# Patient Record
Sex: Male | Born: 1955 | Race: White | Hispanic: No | Marital: Married | Smoking: Former smoker
Health system: Southern US, Community
[De-identification: ages and names within clinical notes are randomized; demographics above are authoritative.]

## PROBLEM LIST (undated history)

## (undated) DIAGNOSIS — I4729 Other ventricular tachycardia: Secondary | ICD-10-CM

## (undated) DIAGNOSIS — F419 Anxiety disorder, unspecified: Secondary | ICD-10-CM

## (undated) DIAGNOSIS — I447 Left bundle-branch block, unspecified: Secondary | ICD-10-CM

## (undated) DIAGNOSIS — K219 Gastro-esophageal reflux disease without esophagitis: Secondary | ICD-10-CM

## (undated) DIAGNOSIS — Z8546 Personal history of malignant neoplasm of prostate: Secondary | ICD-10-CM

## (undated) DIAGNOSIS — Z9289 Personal history of other medical treatment: Secondary | ICD-10-CM

## (undated) DIAGNOSIS — R002 Palpitations: Secondary | ICD-10-CM

## (undated) DIAGNOSIS — I4891 Unspecified atrial fibrillation: Secondary | ICD-10-CM

## (undated) DIAGNOSIS — I10 Essential (primary) hypertension: Secondary | ICD-10-CM

## (undated) DIAGNOSIS — I420 Dilated cardiomyopathy: Secondary | ICD-10-CM

## (undated) DIAGNOSIS — E785 Hyperlipidemia, unspecified: Secondary | ICD-10-CM

## (undated) DIAGNOSIS — Z923 Personal history of irradiation: Secondary | ICD-10-CM

## (undated) DIAGNOSIS — I8290 Acute embolism and thrombosis of unspecified vein: Secondary | ICD-10-CM

## (undated) DIAGNOSIS — I472 Ventricular tachycardia: Secondary | ICD-10-CM

## (undated) DIAGNOSIS — I119 Hypertensive heart disease without heart failure: Secondary | ICD-10-CM

## (undated) DIAGNOSIS — T148XXA Other injury of unspecified body region, initial encounter: Secondary | ICD-10-CM

## (undated) DIAGNOSIS — C61 Malignant neoplasm of prostate: Secondary | ICD-10-CM

## (undated) DIAGNOSIS — I43 Cardiomyopathy in diseases classified elsewhere: Secondary | ICD-10-CM

## (undated) HISTORY — DX: Personal history of malignant neoplasm of prostate: Z85.46

## (undated) HISTORY — DX: Anxiety disorder, unspecified: F41.9

## (undated) HISTORY — DX: Other ventricular tachycardia: I47.29

## (undated) HISTORY — DX: Acute embolism and thrombosis of unspecified vein: I82.90

## (undated) HISTORY — DX: Hyperlipidemia, unspecified: E78.5

## (undated) HISTORY — DX: Left bundle-branch block, unspecified: I44.7

## (undated) HISTORY — DX: Cardiomyopathy in diseases classified elsewhere: I43

## (undated) HISTORY — DX: Essential (primary) hypertension: I10

## (undated) HISTORY — DX: Hypertensive heart disease without heart failure: I11.9

## (undated) HISTORY — DX: Unspecified atrial fibrillation: I48.91

## (undated) HISTORY — DX: Ventricular tachycardia: I47.2

## (undated) HISTORY — DX: Palpitations: R00.2

## (undated) HISTORY — DX: Other injury of unspecified body region, initial encounter: T14.8XXA

## (undated) HISTORY — DX: Dilated cardiomyopathy: I42.0

## (undated) HISTORY — DX: Cardiomyopathy in diseases classified elsewhere: I11.9

## (undated) HISTORY — DX: Personal history of other medical treatment: Z92.89

---

## 1988-09-20 HISTORY — PX: CARDIAC CATHETERIZATION: SHX172

## 1991-09-21 HISTORY — PX: BACK SURGERY: SHX140

## 1998-12-30 ENCOUNTER — Ambulatory Visit (HOSPITAL_COMMUNITY): Admission: RE | Admit: 1998-12-30 | Discharge: 1998-12-30 | Payer: Self-pay | Admitting: *Deleted

## 2001-01-17 ENCOUNTER — Encounter: Payer: Self-pay | Admitting: Specialist

## 2001-01-17 ENCOUNTER — Ambulatory Visit (HOSPITAL_COMMUNITY): Admission: RE | Admit: 2001-01-17 | Discharge: 2001-01-17 | Payer: Self-pay | Admitting: *Deleted

## 2002-04-02 ENCOUNTER — Emergency Department (HOSPITAL_COMMUNITY): Admission: EM | Admit: 2002-04-02 | Discharge: 2002-04-03 | Payer: Self-pay | Admitting: Emergency Medicine

## 2006-07-22 ENCOUNTER — Emergency Department (HOSPITAL_COMMUNITY): Admission: EM | Admit: 2006-07-22 | Discharge: 2006-07-22 | Payer: Self-pay | Admitting: Emergency Medicine
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
## 2008-09-20 DIAGNOSIS — Z9289 Personal history of other medical treatment: Secondary | ICD-10-CM

## 2008-09-20 HISTORY — DX: Personal history of other medical treatment: Z92.89

## 2008-10-11 ENCOUNTER — Encounter: Payer: Self-pay | Admitting: Cardiology

## 2012-03-14 ENCOUNTER — Encounter: Payer: Self-pay | Admitting: Cardiology

## 2012-04-05 ENCOUNTER — Encounter: Payer: Self-pay | Admitting: Cardiology

## 2012-04-18 ENCOUNTER — Encounter: Payer: Self-pay | Admitting: Cardiology

## 2012-05-18 ENCOUNTER — Encounter: Payer: Self-pay | Admitting: Cardiology

## 2012-06-20 ENCOUNTER — Encounter: Payer: Self-pay | Admitting: Cardiology

## 2012-08-16 ENCOUNTER — Encounter: Payer: Self-pay | Admitting: Cardiology

## 2013-07-19 ENCOUNTER — Encounter: Payer: Self-pay | Admitting: Cardiology

## 2013-07-19 ENCOUNTER — Ambulatory Visit (INDEPENDENT_AMBULATORY_CARE_PROVIDER_SITE_OTHER): Payer: PRIVATE HEALTH INSURANCE | Admitting: Cardiology

## 2013-07-19 VITALS — BP 169/98 | HR 73 | Ht 69.0 in | Wt 194.0 lb

## 2013-07-19 DIAGNOSIS — R002 Palpitations: Secondary | ICD-10-CM

## 2013-07-19 DIAGNOSIS — I447 Left bundle-branch block, unspecified: Secondary | ICD-10-CM | POA: Insufficient documentation

## 2013-07-19 DIAGNOSIS — I10 Essential (primary) hypertension: Secondary | ICD-10-CM | POA: Insufficient documentation

## 2013-07-19 NOTE — Progress Notes (Signed)
Dennis Lewis Date of Birth: 1956/09/19 Medical Record #[id number]  History of Present Illness: Mr. Sciulli is seen today for evaluation of palpitations. He is a pleasant 57 year old white male referred by Dr. Clelia Lewis. He has a history of chronic left bundle branch block. He has had extensive cardiac evaluation the past. He had a normal cardiac catheterization in 1990. In 2010 he had a normal [name] study. Echocardiogram in 2010 demonstrated mild LVH with some septal dyssynergy and mildly depressed LV function. He reports a history of palpitations. These were much more noticeable a year ago. He stop taking Protonix and states that his palpitations went away. In April he was taking Advil for a tennis elbow when he had recurrence of the palpitations. This improved off the medication. Last night he states he had recurrent palpitations. He had been very stressed and had driven over 200 miles yesterday. He took a Valium at bedtime and this helped. He denies any sustained tachycardia, dizziness, or syncope. He does take Toprol daily for blood pressure. His blood pressure has been consistently high. Dr. Clelia Lewis started him on a new medication recently but he does not know the name of it. He reacted previously to ACE inhibitors with lip swelling and blistering.  Current Outpatient Prescriptions on File Prior to Visit  Medication Sig Dispense Refill  . metoprolol succinate (TOPROL-XL) 50 MG 24 hr tablet Take 50 mg by mouth daily. Take with or immediately following a meal.       No current facility-administered medications on file prior to visit.    Allergies  Allergen Reactions  . Ace Inhibitors Other (See Comments)    Lip swelling/blisters     Past Medical History  Diagnosis Date  . Palpitations   . Hypertension   . Left bundle branch block   . Hyperlipidemia   . Torn tendon     right shoulder  . Anxiety     Past Surgical History  Procedure Laterality Date  . Cardiac catheterization      . Back surgery      History  Smoking status  . Former Smoker  . Quit date: 02/21/1977  Smokeless tobacco  . Not on file    History  Alcohol Use: Not on file    History reviewed. No pertinent family history.  Review of Systems: As noted in history of present illness.  All other systems were reviewed and are negative.  Physical Exam: BP 169/98  Pulse 73  Ht 5\' 9"  (1.753 m)  Wt 194 lb (87.998 kg)  BMI 28.64 kg/m2 He is a pleasant white male in no acute distress. HEENT: Normal Neck: No JVD, adenopathy, thyromegaly, or bruits. Lungs: Clear Cardiovascular: Regular rate and rhythm. Normal S1 and S2. No gallop, murmur, or click. Abdomen: Soft and nontender. No masses or hepatosplenomegaly. Extremities: No cyanosis or edema. Pedal pulses are 2+ and symmetric. Skin: Warm and dry Neuro: Alert oriented x3.  LABORATORY DATA: ECG today demonstrates normal sinus rhythm with a rate of 73 beats per minute. He has a chronic left bundle branch block.  Assessment / Plan: 1. Palpitations. His symptoms are consistent with either PACs or PVCs. He has had extensive cardiac evaluation the past which was unremarkable. I recommended continuing with his beta blocker therapy. I recommend he start taking the blood pressure medication prescribed by Dr. Clelia Lewis. I think that improving his blood pressure control is probably the best way to reduce his palpitations. He already avoids caffeine and chocolates. He does not take  decongestants. I do not feel that further cardiac evaluation is needed at this time and I'll see him back as needed.  2. Hypertension. Patient will followup with Dr. Clelia Lewis.

## 2013-07-19 NOTE — Patient Instructions (Signed)
Continue your blood pressure therapy  Keep up with your exercise.  I will see you as needed.

## 2014-01-07 ENCOUNTER — Encounter: Payer: Self-pay | Admitting: Gastroenterology

## 2014-02-05 ENCOUNTER — Ambulatory Visit (AMBULATORY_SURGERY_CENTER): Payer: Self-pay

## 2014-02-05 VITALS — Ht 69.0 in | Wt 196.0 lb

## 2014-02-05 DIAGNOSIS — Z1211 Encounter for screening for malignant neoplasm of colon: Secondary | ICD-10-CM

## 2014-02-05 NOTE — Progress Notes (Signed)
Pt came into the office today for his pre-visit prior to his colonoscopy with Dr Ardis Hughs on 02/18/14. Pt states he feels like he has a lump in his throat, clears his throat a lot and wants to discuss another PPI or endoscopy with the doctor before doing the colonoscopy.Pt was scheduled for an office visit with Arlina Robes on 04/15/14. Colon appt on 02/18/14 was cancelled and will need to reschedule at a later time.

## 2014-02-18 ENCOUNTER — Encounter: Payer: PRIVATE HEALTH INSURANCE | Admitting: Gastroenterology

## 2014-04-02 DIAGNOSIS — C61 Malignant neoplasm of prostate: Secondary | ICD-10-CM

## 2014-04-02 HISTORY — DX: Malignant neoplasm of prostate: C61

## 2014-04-02 HISTORY — PX: PROSTATE BIOPSY: SHX241

## 2014-04-15 ENCOUNTER — Ambulatory Visit: Payer: PRIVATE HEALTH INSURANCE | Admitting: Gastroenterology

## 2014-05-10 ENCOUNTER — Encounter: Payer: Self-pay | Admitting: Radiation Oncology

## 2014-05-10 NOTE — Progress Notes (Signed)
GU Location of Tumor / Histology: Prostate adenocarcinoma  If Prostate Cancer, Gleason Score is (3 + 4) and PSA is (4.263 on 02/08/14)  [name] presented 2 months ago with signs/symptoms of: elevated PSA, March 2015 PSA 5.1  Biopsies of  prostate(if applicable) revealed:  3/70/48 Volume 30 gm   Past/Anticipated interventions by urology, if any: biopsy x 1  Past/Anticipated interventions by medical oncology, if any: none  Weight changes, if any: intentional weight loss of 20 lbs since April with diet change and exercise  Bowel/Bladder complaints, if any:  IPSS , nocturia x 2  Nausea/Vomiting, if any: no  Pain issues, if any:  no  SAFETY ISSUES:  Prior radiation? no  Pacemaker/ICD? No, left bundle branch block, heart palpitations at times  Possible current pregnancy? na   Is the patient on methotrexate? 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

## 2014-05-14 ENCOUNTER — Encounter: Payer: Self-pay | Admitting: Radiation Oncology

## 2014-05-14 ENCOUNTER — Telehealth: Payer: Self-pay | Admitting: *Deleted

## 2014-05-14 ENCOUNTER — Ambulatory Visit
Admission: RE | Admit: 2014-05-14 | Discharge: 2014-05-14 | Disposition: A | Discharge status: Home / Self Care | Payer: PRIVATE HEALTH INSURANCE | Source: Ambulatory Visit | Attending: Radiation Oncology | Admitting: Radiation Oncology

## 2014-05-14 VITALS — BP 182/94 | HR 62 | Temp 98.5°F | Resp 20 | Wt 193.1 lb

## 2014-05-14 DIAGNOSIS — Z87891 Personal history of nicotine dependence: Secondary | ICD-10-CM | POA: Insufficient documentation

## 2014-05-14 DIAGNOSIS — Z510 Encounter for antineoplastic radiation therapy: Secondary | ICD-10-CM | POA: Insufficient documentation

## 2014-05-14 DIAGNOSIS — I10 Essential (primary) hypertension: Secondary | ICD-10-CM | POA: Insufficient documentation

## 2014-05-14 DIAGNOSIS — C61 Malignant neoplasm of prostate: Secondary | ICD-10-CM | POA: Diagnosis not present

## 2014-05-14 DIAGNOSIS — N529 Male erectile dysfunction, unspecified: Secondary | ICD-10-CM | POA: Diagnosis not present

## 2014-05-14 HISTORY — DX: Gastro-esophageal reflux disease without esophagitis: K21.9

## 2014-05-14 HISTORY — DX: Malignant neoplasm of prostate: C61

## 2014-05-14 NOTE — Progress Notes (Signed)
Patient circled 8/10 on distress screen. Left vm for SW. Spoke with wife who stated she feels once patient receives more information today his distress level will decrease. She stated she did not feel he would harm himself. Encouraged her to ask any questions that come to mind today. Also informed her that a SW may come to see pt today or give him a call later to follow up. Ms Mimbs verbalized understanding.

## 2014-05-14 NOTE — Telephone Encounter (Signed)
CALLED PATIENT TO INFORM OF GOLD SEED PLACEMENT ON 06-13-14 - ARRIVING 9:15 AM @ DR. GRAPEY'S OFFICE AND HIS SIM ON 06-17-14 @ 9 AM WITH DR. Valere Dross,  SPOKE WITH PATIENT AND HE IS AWARE OF THESE APPTS.

## 2014-05-14 NOTE — Progress Notes (Signed)
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 male who is seen today through the courtesy of Dr. Risa Grill for discussion of possible radiation therapy in the management of his intermediate risk adenocarcinoma prostate. He was noted to have a PSA of 5.1 by his primary care physician, Dr. Lutricia Feil,  this past March. A repeat PSA on 02/08/2014 was 4.26. He was seen by Dr. Risa Grill who performed ultrasound-guided biopsies on 04/02/2004. He was found to have Gleason 7 (3+4) involving 20% of one core from the left lateral mid gland and 30% of one core from the left lateral apex. He had Gleason 6 (3+3) involving 20% of one core from the left lateral base and 10% of one core from left apex. His gland volume was approximately 30 cc. He is doing well from a GU and GI standpoint. His I PSS score is 3. He does have erectile dysfunction which she attributes to pressure medication.   PREVIOUS RADIATION THERAPY: No   PAST MEDICAL HISTORY:  has a past medical history of Palpitations; Hypertension; Left bundle branch block; Hyperlipidemia; Torn tendon; Anxiety; Prostate cancer (04/02/14); and GERD (gastroesophageal reflux disease).     PAST SURGICAL HISTORY:  Past Surgical History  Procedure Laterality Date  . Cardiac catheterization    . Back surgery  1993  . Prostate biopsy  04/02/14    Gleason 7, vol 30 gm     FAMILY HISTORY: family history includes Cancer in his brother; Cancer (age of onset: 45) in his father. His father died of throat cancer 83, and his mother died following a motor vehicle accident at 82. His one half brother was diagnosed with prostate  cancer at age 12.   SOCIAL HISTORY:  reports that he quit smoking about 35 years ago. His smoking use included Cigarettes. He smoked 0.00 packs per day. He has never used smokeless tobacco. He reports that he drinks alcohol. He reports that he does not use illicit drugs.  Married, 2 children. 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   ALLERGIES: Ace inhibitors   MEDICATIONS:  Current Outpatient Prescriptions  Medication Sig Dispense Refill  . amLODipine (NORVASC) 5 MG tablet Take 5 mg by mouth daily.      . metoprolol succinate (TOPROL-XL) 50 MG 24 hr tablet Take 50 mg by mouth daily. Take with or immediately following a meal.       No current facility-administered medications for this encounter.     REVIEW OF SYSTEMS:  Pertinent items are noted in HPI.    PHYSICAL EXAM:  weight is 193 lb 1.6 oz (87.59 kg). His oral temperature is 98.5 F (36.9 C). His blood pressure is 182/94 and his pulse is 62. His respiration is 20.   Alert and oriented 58 year old white male appearing his stated age. Head and neck examination: Grossly unremarkable. Nodes: No cervical or supraclavicular lymphadenopathy. Chest: Lungs clear. Abdomen: Without masses organomegaly. Genitalia: Unremarkable to inspection. Rectal: The prostate gland is normal in size. There is subtle induration along the left lateral lobe with  no definite nodularity or periprostatic tumor extension. Extremities: Without edema.   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 I explained to the patient and his wife that his prognosis is related to his stage, Gleason score, and PSA level. His stage and PSA level are favorable while his  Gleason score of 7 is of intermediate favorability. He does have low volume Gleason 7. Management options include surgery versus radiation therapy. Radiation therapy options include seed implantation with or without 5 weeks of external beam or 8 weeks of external beam/IMRT. He would be a candidate for seed implantation alone, however, I would need to confirm absence of periprostatic tumor extension by MRI scan in view of subtle induration felt along the left lateral lobe. What I am feeling may simply be post biopsy changes. We discussed the potential acute and late toxicities of radiation therapy. My bias would be towards robotic surgery in view of his young age even though we should expect the same cure rate with radiation therapy. After lengthy discussion he is most interested in external beam/IMRT which I think is a good option as well. We briefly discussed short-term androgen deprivation therapy which is being studied on a national trial. In view of his low volume disease I do not feel that there would be much benefit for short term androgen deprivation therapy. We'll need to get him scheduled for placement of 3 gold seed markers. We also discussed treatment with a company full bladder to minimize urinary treatment-related toxicity.   PLAN: As discussed above.  I spent 60 minutes minutes face to face with the patient and more than 50% of that time was spent in counseling and/or coordination of care.

## 2014-05-14 NOTE — Progress Notes (Signed)
Please see the Nurse Progress Note in the MD Initial Consult Encounter for this patient. 

## 2014-05-17 ENCOUNTER — Encounter: Payer: Self-pay | Admitting: *Deleted

## 2014-05-17 NOTE — Progress Notes (Signed)
Dennis Lewis Psychosocial Distress Screening Clinical Social Work  Clinical Social Work was referred by distress screening protocol.  The patient scored a 8 on the Psychosocial Distress Thermometer which indicates severe distress. Clinical Social Worker phoned pt to assess for distress and other psychosocial needs. CSW left an abbreviated message for pt to return call.    ONCBCN DISTRESS SCREENING 05/14/2014  Screening Type Initial Screening  Elta [place] the number that describes how much distress you have been experiencing in the past week 8  Emotional problem type Depression;Nervousness/Anxiety;Adjusting to illness    Clinical Social Worker follow up needed: Yes.    If yes, follow up plan: CSW awaits return call and will attempt to see at future appointments.   Loren Racer, LCSW Clinical Social Worker [facility] [place] [facility] [place] Wednesday, Thursday and Friday Phone: [phone] Fax: [phone]

## 2014-05-23 ENCOUNTER — Telehealth: Payer: Self-pay | Admitting: Cardiology

## 2014-05-23 NOTE — Telephone Encounter (Signed)
°   Lattie Haw with [facility] called. Patient needs something for acid reflux would like to know what Dr [name] recommends.  Protonix made palpitations worse.  Nexium is not covered by insurance.  Please call Lattie Haw & advise. She will call in RX.

## 2014-05-23 NOTE — Telephone Encounter (Signed)
I would recommend omeprazole 20 mg daily. If this doesn't help he will need to discuss with Dr. Brigitte Pulse.  Dennis Temkin [name] MD, [facility]

## 2014-05-23 NOTE — Telephone Encounter (Signed)
Will forward for dr jordan's review

## 2014-05-24 NOTE — Telephone Encounter (Signed)
Spoke with lisa, aware of dr [name] recommendations

## 2014-06-17 ENCOUNTER — Ambulatory Visit
Admission: RE | Admit: 2014-06-17 | Discharge: 2014-06-17 | Disposition: A | Discharge status: Home / Self Care | Payer: PRIVATE HEALTH INSURANCE | Source: Ambulatory Visit | Attending: Radiation Oncology | Admitting: Radiation Oncology

## 2014-06-17 DIAGNOSIS — C61 Malignant neoplasm of prostate: Secondary | ICD-10-CM

## 2014-06-17 DIAGNOSIS — Z510 Encounter for antineoplastic radiation therapy: Secondary | ICD-10-CM | POA: Diagnosis not present

## 2014-06-17 NOTE — Progress Notes (Signed)
Complex simulation/treatment planning note: The patient was taken to the CT simulator. A Vac lock immobilization device was constructed. A red rubber tube was placed within the rectal vault. He was then catheterized and contrast instilled into the bladder/urethra. He was then scanned. The CT data set was sent to the MIM planning system right contoured his prostate, seminal vesicles, bladder, rectum, and proximal rectosigmoid colon. He was noted the most superior seen was outside the gland and towards the base of the bladder/seminal vesicles. I'm prescribing 7800 cGy in 40 sessions to his prostate PTV which represents the prostate plus 0.8 cm except for 0.5 cm along the rectum. I prescribing 5600 cGy 40 sessions to his seminal vesicles. His seminal vesicle PTV represents a seminal vesicles by 0.5 cm. He is now ready for IMRT simulation/treatment planning.

## 2014-06-21 ENCOUNTER — Encounter: Payer: Self-pay | Admitting: Radiation Oncology

## 2014-06-21 NOTE — Progress Notes (Signed)
IMRT/treatment planning note: The patient completed his IMRT planning in the management of his carcinoma the prostate. IMRT was chosen to decrease the risk for both acute and late bladder and rectal toxicity compared to 3-D conformal or conventional radiation therapy. Dose volume histograms were obtained for the target structures and also avoidance structures including the bladder and rectum. We met our departmental guidelines except for the 4000cGy Pollack rectal DVH to maintain 95% of that prescription coverage for the prostate PTV. I prescribing 7800 cGy 40 sessions to his prostate PTV and 5000 and 60 cGy in 40 sessions to his seminal vesicle PTV.

## 2014-06-24 ENCOUNTER — Encounter: Payer: Self-pay | Admitting: Gastroenterology

## 2014-06-24 ENCOUNTER — Encounter: Payer: Self-pay | Admitting: Radiation Oncology

## 2014-06-24 ENCOUNTER — Ambulatory Visit (INDEPENDENT_AMBULATORY_CARE_PROVIDER_SITE_OTHER): Payer: PRIVATE HEALTH INSURANCE | Admitting: Gastroenterology

## 2014-06-24 VITALS — BP 152/82 | HR 72 | Ht 69.0 in | Wt 190.0 lb

## 2014-06-24 DIAGNOSIS — R0989 Other specified symptoms and signs involving the circulatory and respiratory systems: Secondary | ICD-10-CM

## 2014-06-24 DIAGNOSIS — C61 Malignant neoplasm of prostate: Secondary | ICD-10-CM | POA: Diagnosis not present

## 2014-06-24 DIAGNOSIS — R198 Other specified symptoms and signs involving the digestive system and abdomen: Secondary | ICD-10-CM

## 2014-06-24 DIAGNOSIS — Z1211 Encounter for screening for malignant neoplasm of colon: Secondary | ICD-10-CM

## 2014-06-24 DIAGNOSIS — Z510 Encounter for antineoplastic radiation therapy: Secondary | ICD-10-CM | POA: Insufficient documentation

## 2014-06-24 DIAGNOSIS — K219 Gastro-esophageal reflux disease without esophagitis: Secondary | ICD-10-CM

## 2014-06-24 DIAGNOSIS — F458 Other somatoform disorders: Secondary | ICD-10-CM

## 2014-06-24 NOTE — Patient Instructions (Signed)
Continue omeprazole 20mg  once daily (before BF meal).  If the palpatations get worse, call Dr. Ardis Hughs. Will then change to H2 blockers (twice daily). Colonoscopy reminder, recall system in April 2016.

## 2014-06-24 NOTE — Progress Notes (Signed)
Chart note: The patient was re\re planned and to try to get his moderate dose rectal volume within our departmental guidelines. We achieved our goals. His treatment plan was signed this morning. Dose prescription is unchanged.

## 2014-06-24 NOTE — Progress Notes (Signed)
HPI: This is a   very pleasant 58 year old man whom I am meeting for the first time today.  [facility], Dr. Vladimir Faster 2001.  Was normal.  Also EGD at same time, was normal.  All per patient.  Felt globus sensation that led to that EGD.  Has been on PPI for 10 years,   No pyrosis no globus for 10 years.  Stopped ppi 1 years ago for heart palpations but then   Restarted PPI, after ENT visit, recommend omeprazole 20mg  once daily.  For 10 days, symptoms are a lot better but not gone.  He does not have dysphasia, rather it globus sensation that is present with every swallow.  Had physical recently.  No bowel issues.  No overt GI bleeding.  Weight overall stable.  Diagnosed with prostate cancer, biopsy after PSA elevation.  External radiation starting tomorrow.  Drinks decaf coffee, drinks 4-5 beers daily. Bud light.    Review of systems: Pertinent positive and negative review of systems were noted in the above HPI section. Complete review of systems was performed and was otherwise normal.    Past Medical History  Diagnosis Date  . Palpitations   . Hypertension   . Left bundle branch block   . Hyperlipidemia   . Torn tendon     right shoulder  . Anxiety   . Prostate cancer 04/02/14    Gleason 7, volume 30 gm  . GERD (gastroesophageal reflux disease)     Past Surgical History  Procedure Laterality Date  . Cardiac catheterization    . Back surgery  1993  . Prostate biopsy  04/02/14    Gleason 7, vol 30 gm    Current Outpatient Prescriptions  Medication Sig Dispense Refill  . amLODipine (NORVASC) 5 MG tablet Take 5 mg by mouth daily.      . metoprolol succinate (TOPROL-XL) 50 MG 24 hr tablet Take 50 mg by mouth daily. Take with or immediately following a meal.       No current facility-administered medications for this visit.    Allergies as of 06/24/2014 - Review Complete 06/24/2014  Allergen Reaction Noted  . Ace inhibitors Other (See Comments) 07/19/2013    Family  History  Problem Relation Age of Onset  . Cancer Father 57    throat, smoker  . Cancer Brother     prostate, surgery    History   Social History  . Marital Status: Single    Spouse Name: N/A    Number of Children: N/A  . Years of Education: N/A   Occupational History  . Not on file.   Social History Main Topics  . Smoking status: Former Smoker    Types: Cigarettes    Quit date: 02/22/1979  . Smokeless tobacco: Never Used  . Alcohol Use: Yes     Comment: beer daily  . Drug Use: No  . Sexual Activity: Not on file   Other Topics Concern  . Not on file   Social History Narrative  . No narrative on file       Physical Exam: BP 152/82  Pulse 72  Ht 5\' 9"  (1.753 m)  Wt 190 lb (86.183 kg)  BMI 28.05 kg/m2 Constitutional: generally well-appearing Psychiatric: alert and oriented x3 Eyes: extraocular movements intact Mouth: oral pharynx moist, no lesions Neck: supple no lymphadenopathy Cardiovascular: heart regular rate and rhythm Lungs: clear to auscultation bilaterally Abdomen: soft, nontender, nondistended, no obvious ascites, no peritoneal signs, normal bowel sounds Extremities: no lower extremity edema  bilaterally Skin: no lesions on visible extremities    Assessment and plan: 58 y.o. male with  GERD related globus sensation, possible side effects from proton pump inhibitor, routine risk for colon cancer  Proton pump inhibitors clearly helps his globus sensation. He was having the sensation 14 years ago when he underwent EGD and proton pump at her inhibitors completely healthy symptom. He is also very clear that palpitations which were going on 10 times per month or so, almost completely improved and he stopped proton pump inhibitors.  Possibly the palpitations were side effect of the proton pump inhibitors. His globus returned and omeprazole 20 mg per day has helped and has not caused the palpitations to worsen. He will continue on the omeprazole 20 mg once  daily and if she notices the palpitations are returning then I would likely have him stop and put him on H2 blockers twice daily. He is "due" for routine colon cancer screening. Colonoscopy 14 years ago was normal. Unfortunately he is going to begin prostate radiation for 8 weeks starting tomorrow. He is not interested in colonoscopy until he completes that course and recovered from that. I think it is perfectly reasonable and we'll put him in our colonoscopy reminder system for colonoscopy in the spring time.

## 2014-06-26 ENCOUNTER — Ambulatory Visit
Admission: RE | Admit: 2014-06-26 | Discharge: 2014-06-26 | Disposition: A | Discharge status: Home / Self Care | Payer: PRIVATE HEALTH INSURANCE | Source: Ambulatory Visit | Attending: Radiation Oncology | Admitting: Radiation Oncology

## 2014-06-26 DIAGNOSIS — C61 Malignant neoplasm of prostate: Secondary | ICD-10-CM

## 2014-06-26 DIAGNOSIS — Z510 Encounter for antineoplastic radiation therapy: Secondary | ICD-10-CM | POA: Diagnosis not present

## 2014-06-26 NOTE — Progress Notes (Signed)
Chart note the patient began his IMRT today with dual arc VMAT  IMRT the management of his carcinoma the prostate. He is being treated with 2 dynamic MLCs corresponding to one set of IMRT treatment devices [id number])

## 2014-06-27 ENCOUNTER — Ambulatory Visit
Admission: RE | Admit: 2014-06-27 | Discharge: 2014-06-27 | Disposition: A | Discharge status: Home / Self Care | Payer: PRIVATE HEALTH INSURANCE | Source: Ambulatory Visit | Attending: Radiation Oncology | Admitting: Radiation Oncology

## 2014-06-27 DIAGNOSIS — Z510 Encounter for antineoplastic radiation therapy: Secondary | ICD-10-CM | POA: Diagnosis not present

## 2014-06-27 DIAGNOSIS — C61 Malignant neoplasm of prostate: Secondary | ICD-10-CM

## 2014-06-27 NOTE — Progress Notes (Signed)
Patient education completed with patient. Gave him "Radiation and You" booklet with all pertinent information marked and discussed, re: rectal discomfort/care, fatigue, hair loss, urinary/bladder irritation/manament, nutrition, pain. Teach back used. Pt verbalized understanding.

## 2014-06-28 ENCOUNTER — Ambulatory Visit
Admission: RE | Admit: 2014-06-28 | Discharge: 2014-06-28 | Disposition: A | Discharge status: Home / Self Care | Payer: PRIVATE HEALTH INSURANCE | Source: Ambulatory Visit | Attending: Radiation Oncology | Admitting: Radiation Oncology

## 2014-06-28 DIAGNOSIS — Z510 Encounter for antineoplastic radiation therapy: Secondary | ICD-10-CM | POA: Diagnosis not present

## 2014-07-01 ENCOUNTER — Ambulatory Visit
Admission: RE | Admit: 2014-07-01 | Discharge: 2014-07-01 | Disposition: A | Discharge status: Home / Self Care | Payer: PRIVATE HEALTH INSURANCE | Source: Ambulatory Visit | Attending: Radiation Oncology | Admitting: Radiation Oncology

## 2014-07-01 VITALS — BP 177/84 | HR 63 | Temp 97.6°F | Resp 20 | Wt 193.1 lb

## 2014-07-01 DIAGNOSIS — Z510 Encounter for antineoplastic radiation therapy: Secondary | ICD-10-CM | POA: Diagnosis not present

## 2014-07-01 DIAGNOSIS — C61 Malignant neoplasm of prostate: Secondary | ICD-10-CM

## 2014-07-01 NOTE — Progress Notes (Signed)
Weekly Management Note:  Site: Prostate Current Dose:  780  cGy Projected Dose: 7800  cGy  Narrative: The patient is seen today for routine under treatment assessment. CBCT/MVCT images/port films were reviewed. The chart was reviewed.   Bladder filling satisfactory. No GU or GI difficulty .  Physical Examination:  Filed Vitals:   07/01/14 1021  BP: 177/84  Pulse: 63  Temp: 97.6 F (36.4 C)  Resp: 20  .  Weight: 193 lb 1.6 oz (87.59 kg). No change.  Impression: Tolerating radiation therapy well.  Plan: Continue radiation therapy as planned.

## 2014-07-01 NOTE — Progress Notes (Signed)
Patient denies pain, fatigue, loss of appetite, urinary/bowel issues. He states he is unsure why his BP is high today. Informed him this RN would be glad to check his BP any time.

## 2014-07-02 ENCOUNTER — Ambulatory Visit
Admission: RE | Admit: 2014-07-02 | Discharge: 2014-07-02 | Disposition: A | Discharge status: Home / Self Care | Payer: PRIVATE HEALTH INSURANCE | Source: Ambulatory Visit | Attending: Radiation Oncology | Admitting: Radiation Oncology

## 2014-07-02 DIAGNOSIS — Z510 Encounter for antineoplastic radiation therapy: Secondary | ICD-10-CM | POA: Diagnosis not present

## 2014-07-03 ENCOUNTER — Ambulatory Visit
Admission: RE | Admit: 2014-07-03 | Discharge: 2014-07-03 | Disposition: A | Discharge status: Home / Self Care | Payer: PRIVATE HEALTH INSURANCE | Source: Ambulatory Visit | Attending: Radiation Oncology | Admitting: Radiation Oncology

## 2014-07-03 DIAGNOSIS — Z510 Encounter for antineoplastic radiation therapy: Secondary | ICD-10-CM | POA: Diagnosis not present

## 2014-07-04 ENCOUNTER — Ambulatory Visit
Admission: RE | Admit: 2014-07-04 | Discharge: 2014-07-04 | Disposition: A | Discharge status: Home / Self Care | Payer: PRIVATE HEALTH INSURANCE | Source: Ambulatory Visit | Attending: Radiation Oncology | Admitting: Radiation Oncology

## 2014-07-04 DIAGNOSIS — Z510 Encounter for antineoplastic radiation therapy: Secondary | ICD-10-CM | POA: Diagnosis not present

## 2014-07-05 ENCOUNTER — Ambulatory Visit
Admission: RE | Admit: 2014-07-05 | Discharge: 2014-07-05 | Disposition: A | Discharge status: Home / Self Care | Payer: PRIVATE HEALTH INSURANCE | Source: Ambulatory Visit | Attending: Radiation Oncology | Admitting: Radiation Oncology

## 2014-07-05 DIAGNOSIS — Z510 Encounter for antineoplastic radiation therapy: Secondary | ICD-10-CM | POA: Diagnosis not present

## 2014-07-08 ENCOUNTER — Ambulatory Visit
Admission: RE | Admit: 2014-07-08 | Discharge: 2014-07-08 | Disposition: A | Discharge status: Home / Self Care | Payer: PRIVATE HEALTH INSURANCE | Source: Ambulatory Visit | Attending: Radiation Oncology | Admitting: Radiation Oncology

## 2014-07-08 ENCOUNTER — Encounter: Payer: Self-pay | Admitting: Radiation Oncology

## 2014-07-08 VITALS — BP 192/90 | HR 60 | Temp 97.8°F | Resp 20 | Wt 193.5 lb

## 2014-07-08 DIAGNOSIS — Z510 Encounter for antineoplastic radiation therapy: Secondary | ICD-10-CM | POA: Diagnosis not present

## 2014-07-08 DIAGNOSIS — C61 Malignant neoplasm of prostate: Secondary | ICD-10-CM

## 2014-07-08 NOTE — Progress Notes (Signed)
Weekly Management Note:  Site: Prostate Current Dose:  1755  cGy Projected Dose: 7800  cGy  Narrative: The patient is seen today for routine under treatment assessment. CBCT/MVCT images/port films were reviewed. The chart was reviewed.   Bladder filling is excellent. No GU or GI difficulty.  Physical Examination:  Filed Vitals:   07/08/14 1107  BP: 192/90  Pulse: 60  Temp: 97.8 F (36.6 C)  Resp: 20  .  Weight: 193 lb 8 oz (87.771 kg). No change.  Impression: Tolerating radiation therapy well.  Plan: Continue radiation therapy as planned.

## 2014-07-08 NOTE — Progress Notes (Addendum)
Patient denies pain, fatigue, loss of appetite, urinary/bowel issues. He states he takes his BP meds every morning at 7:30 am. He has appt with his PCP next week and will discuss BP at that time. Pt states he feels he has "white coat syndrome". He also states he has gained 7 lbs and is not walking 4 miles a day anymore but hopes to begin again.

## 2014-07-09 ENCOUNTER — Ambulatory Visit
Admission: RE | Admit: 2014-07-09 | Discharge: 2014-07-09 | Disposition: A | Discharge status: Home / Self Care | Payer: PRIVATE HEALTH INSURANCE | Source: Ambulatory Visit | Attending: Radiation Oncology | Admitting: Radiation Oncology

## 2014-07-09 DIAGNOSIS — Z510 Encounter for antineoplastic radiation therapy: Secondary | ICD-10-CM | POA: Diagnosis not present

## 2014-07-10 ENCOUNTER — Ambulatory Visit
Admission: RE | Admit: 2014-07-10 | Discharge: 2014-07-10 | Disposition: A | Discharge status: Home / Self Care | Payer: PRIVATE HEALTH INSURANCE | Source: Ambulatory Visit | Attending: Radiation Oncology | Admitting: Radiation Oncology

## 2014-07-10 DIAGNOSIS — Z510 Encounter for antineoplastic radiation therapy: Secondary | ICD-10-CM | POA: Diagnosis not present

## 2014-07-11 ENCOUNTER — Ambulatory Visit
Admission: RE | Admit: 2014-07-11 | Discharge: 2014-07-11 | Disposition: A | Discharge status: Home / Self Care | Payer: PRIVATE HEALTH INSURANCE | Source: Ambulatory Visit | Attending: Radiation Oncology | Admitting: Radiation Oncology

## 2014-07-11 DIAGNOSIS — Z510 Encounter for antineoplastic radiation therapy: Secondary | ICD-10-CM | POA: Diagnosis not present

## 2014-07-12 ENCOUNTER — Ambulatory Visit
Admission: RE | Admit: 2014-07-12 | Discharge: 2014-07-12 | Disposition: A | Discharge status: Home / Self Care | Payer: PRIVATE HEALTH INSURANCE | Source: Ambulatory Visit | Attending: Radiation Oncology | Admitting: Radiation Oncology

## 2014-07-12 DIAGNOSIS — Z510 Encounter for antineoplastic radiation therapy: Secondary | ICD-10-CM | POA: Diagnosis not present

## 2014-07-15 ENCOUNTER — Encounter: Payer: Self-pay | Admitting: Radiation Oncology

## 2014-07-15 ENCOUNTER — Ambulatory Visit
Admission: RE | Admit: 2014-07-15 | Discharge: 2014-07-15 | Disposition: A | Discharge status: Home / Self Care | Payer: PRIVATE HEALTH INSURANCE | Source: Ambulatory Visit | Attending: Radiation Oncology | Admitting: Radiation Oncology

## 2014-07-15 VITALS — BP 184/91 | HR 65 | Temp 97.6°F | Resp 20 | Wt 192.7 lb

## 2014-07-15 DIAGNOSIS — Z510 Encounter for antineoplastic radiation therapy: Secondary | ICD-10-CM | POA: Diagnosis not present

## 2014-07-15 DIAGNOSIS — C61 Malignant neoplasm of prostate: Secondary | ICD-10-CM

## 2014-07-15 NOTE — Progress Notes (Addendum)
Patient denies pain, urinary/bowel issues, fatigue, loss of appetite. He states he has taken his BP medications today, has appointment with his PCP next Fri. Gave pt printout of his VS for past year to take with him to his PCP appt.

## 2014-07-15 NOTE — Progress Notes (Signed)
Weekly Management Note:  Site: Prostate Current Dose:  2730  cGy Projected Dose: 7800  cGy  Narrative: The patient is seen today for routine under treatment assessment. CBCT/MVCT images/port films were reviewed. The chart was reviewed.   Bladder filling satisfactory. No GU or GI difficulties.  Physical Examination:  Filed Vitals:   07/15/14 0914  BP: 184/91  Pulse: 65  Temp: 97.6 F (36.4 C)  Resp: 20  .  Weight: 192 lb 11.2 oz (87.408 kg). No change.  Impression: Tolerating radiation therapy well.  Plan: Continue radiation therapy as planned.

## 2014-07-16 ENCOUNTER — Telehealth: Payer: Self-pay | Admitting: Gastroenterology

## 2014-07-16 ENCOUNTER — Ambulatory Visit
Admission: RE | Admit: 2014-07-16 | Discharge: 2014-07-16 | Disposition: A | Discharge status: Home / Self Care | Payer: PRIVATE HEALTH INSURANCE | Source: Ambulatory Visit | Attending: Radiation Oncology | Admitting: Radiation Oncology

## 2014-07-16 DIAGNOSIS — Z510 Encounter for antineoplastic radiation therapy: Secondary | ICD-10-CM | POA: Diagnosis not present

## 2014-07-16 NOTE — Telephone Encounter (Signed)
The pt will try zantac twice daily and call back to update on his condition per Dr Ardis Hughs last office note

## 2014-07-17 ENCOUNTER — Ambulatory Visit
Admission: RE | Admit: 2014-07-17 | Discharge: 2014-07-17 | Disposition: A | Discharge status: Home / Self Care | Payer: PRIVATE HEALTH INSURANCE | Source: Ambulatory Visit | Attending: Radiation Oncology | Admitting: Radiation Oncology

## 2014-07-17 DIAGNOSIS — Z510 Encounter for antineoplastic radiation therapy: Secondary | ICD-10-CM | POA: Diagnosis not present

## 2014-07-18 ENCOUNTER — Ambulatory Visit
Admission: RE | Admit: 2014-07-18 | Discharge: 2014-07-18 | Disposition: A | Discharge status: Home / Self Care | Payer: PRIVATE HEALTH INSURANCE | Source: Ambulatory Visit | Attending: Radiation Oncology | Admitting: Radiation Oncology

## 2014-07-18 DIAGNOSIS — Z510 Encounter for antineoplastic radiation therapy: Secondary | ICD-10-CM | POA: Diagnosis not present

## 2014-07-19 ENCOUNTER — Ambulatory Visit
Admission: RE | Admit: 2014-07-19 | Discharge: 2014-07-19 | Disposition: A | Discharge status: Home / Self Care | Payer: PRIVATE HEALTH INSURANCE | Source: Ambulatory Visit | Attending: Radiation Oncology | Admitting: Radiation Oncology

## 2014-07-19 DIAGNOSIS — Z510 Encounter for antineoplastic radiation therapy: Secondary | ICD-10-CM | POA: Diagnosis not present

## 2014-07-22 ENCOUNTER — Inpatient Hospital Stay
Admission: RE | Admit: 2014-07-22 | Discharge: 2014-07-22 | Disposition: A | Discharge status: Home / Self Care | Payer: Self-pay | Source: Ambulatory Visit | Attending: Radiation Oncology | Admitting: Radiation Oncology

## 2014-07-22 ENCOUNTER — Encounter: Payer: Self-pay | Admitting: Radiation Oncology

## 2014-07-22 ENCOUNTER — Ambulatory Visit
Admission: RE | Admit: 2014-07-22 | Discharge: 2014-07-22 | Disposition: A | Discharge status: Home / Self Care | Payer: PRIVATE HEALTH INSURANCE | Source: Ambulatory Visit | Attending: Radiation Oncology | Admitting: Radiation Oncology

## 2014-07-22 VITALS — BP 187/95 | HR 59 | Temp 98.1°F | Resp 20 | Wt 193.4 lb

## 2014-07-22 DIAGNOSIS — Z510 Encounter for antineoplastic radiation therapy: Secondary | ICD-10-CM | POA: Diagnosis not present

## 2014-07-22 DIAGNOSIS — C61 Malignant neoplasm of prostate: Secondary | ICD-10-CM

## 2014-07-22 NOTE — Progress Notes (Signed)
Weekly Management Note:  Site:prostate Current Dose:  3705  cGy Projected Dose: 7800  cGy  Narrative: The patient is seen today for routine under treatment assessment. CBCT/MVCT images/port films were reviewed. The chart was reviewed.   Bladder filling is satisfactory. No new GU or GI difficulties. He is on a new blood pressure medication. He will bring in the name of this medication tomorrow.  Physical Examination:  Filed Vitals:   07/22/14 0923  BP: 187/95  Pulse: 59  Temp:   Resp:   .  Weight: 193 lb 6.4 oz (87.726 kg). No change.  Impression: Tolerating radiation therapy well.  Plan: Continue radiation therapy as planned.

## 2014-07-22 NOTE — Progress Notes (Signed)
Patient denies pain, fatigue, loss of appetite, urinary/bowel issues. He states he began new BP med last Thurs but cannot recall name or dose. He will bring information tomorrow. Patient returns to PCP, Dr Brigitte Pulse on 08/01/14. He is keeping record of his BP daily.

## 2014-07-23 ENCOUNTER — Ambulatory Visit: Admission: RE | Admit: 2014-07-23 | Payer: PRIVATE HEALTH INSURANCE | Source: Ambulatory Visit

## 2014-07-24 ENCOUNTER — Ambulatory Visit
Admission: RE | Admit: 2014-07-24 | Discharge: 2014-07-24 | Disposition: A | Discharge status: Home / Self Care | Payer: PRIVATE HEALTH INSURANCE | Source: Ambulatory Visit | Attending: Radiation Oncology | Admitting: Radiation Oncology

## 2014-07-24 DIAGNOSIS — Z510 Encounter for antineoplastic radiation therapy: Secondary | ICD-10-CM | POA: Diagnosis not present

## 2014-07-25 ENCOUNTER — Ambulatory Visit
Admission: RE | Admit: 2014-07-25 | Discharge: 2014-07-25 | Disposition: A | Discharge status: Home / Self Care | Payer: PRIVATE HEALTH INSURANCE | Source: Ambulatory Visit | Attending: Radiation Oncology | Admitting: Radiation Oncology

## 2014-07-25 DIAGNOSIS — Z510 Encounter for antineoplastic radiation therapy: Secondary | ICD-10-CM | POA: Diagnosis not present

## 2014-07-26 ENCOUNTER — Ambulatory Visit
Admission: RE | Admit: 2014-07-26 | Discharge: 2014-07-26 | Disposition: A | Discharge status: Home / Self Care | Payer: PRIVATE HEALTH INSURANCE | Source: Ambulatory Visit | Attending: Radiation Oncology | Admitting: Radiation Oncology

## 2014-07-26 DIAGNOSIS — Z510 Encounter for antineoplastic radiation therapy: Secondary | ICD-10-CM | POA: Diagnosis not present

## 2014-07-29 ENCOUNTER — Ambulatory Visit
Admission: RE | Admit: 2014-07-29 | Discharge: 2014-07-29 | Disposition: A | Discharge status: Home / Self Care | Payer: PRIVATE HEALTH INSURANCE | Source: Ambulatory Visit | Attending: Radiation Oncology | Admitting: Radiation Oncology

## 2014-07-29 ENCOUNTER — Ambulatory Visit: Payer: PRIVATE HEALTH INSURANCE | Admitting: Radiation Oncology

## 2014-07-29 ENCOUNTER — Ambulatory Visit: Payer: PRIVATE HEALTH INSURANCE

## 2014-07-29 ENCOUNTER — Encounter: Payer: Self-pay | Admitting: Radiation Oncology

## 2014-07-29 VITALS — BP 185/87 | HR 62 | Temp 97.6°F | Resp 12 | Wt 193.4 lb

## 2014-07-29 DIAGNOSIS — Z510 Encounter for antineoplastic radiation therapy: Secondary | ICD-10-CM | POA: Diagnosis not present

## 2014-07-29 DIAGNOSIS — C61 Malignant neoplasm of prostate: Secondary | ICD-10-CM

## 2014-07-29 NOTE — Progress Notes (Signed)
He is currently in no pain.  Reports urinary frequency Dribbling.  Pt states they urinate 2 - 3 times per night.  Pt reports Diarrhea occasionally, a bowel movement every day. The patient eats a regular, healthy diet.

## 2014-07-29 NOTE — Progress Notes (Signed)
   Weekly Management Note:  outpatient    ICD-9-CM ICD-10-CM   1. Malignant neoplasm of prostate 185 C61     Current Dose:  44.85 Gy  Projected Dose: 78 Gy   Narrative:  The patient presents for routine under treatment assessment.  CBCT/MVCT images/Port film x-rays were reviewed.  The chart was checked. No new complaints  Physical Findings:  weight is 193 lb 6.4 oz (87.726 kg). His oral temperature is 97.6 F (36.4 C). His blood pressure is 185/87 and his pulse is 62. His respiration is 12 and oxygen saturation is 100%.  NAD  Impression:  The patient is tolerating radiotherapy.  Plan:  Continue radiotherapy as planned.   ________________________________   Eppie Gibson, M.D.

## 2014-07-30 ENCOUNTER — Ambulatory Visit
Admission: RE | Admit: 2014-07-30 | Discharge: 2014-07-30 | Disposition: A | Discharge status: Home / Self Care | Payer: PRIVATE HEALTH INSURANCE | Source: Ambulatory Visit | Attending: Radiation Oncology | Admitting: Radiation Oncology

## 2014-07-30 ENCOUNTER — Ambulatory Visit: Payer: PRIVATE HEALTH INSURANCE

## 2014-07-30 DIAGNOSIS — Z510 Encounter for antineoplastic radiation therapy: Secondary | ICD-10-CM | POA: Diagnosis not present

## 2014-07-31 ENCOUNTER — Ambulatory Visit
Admission: RE | Admit: 2014-07-31 | Discharge: 2014-07-31 | Disposition: A | Discharge status: Home / Self Care | Payer: PRIVATE HEALTH INSURANCE | Source: Ambulatory Visit | Attending: Radiation Oncology | Admitting: Radiation Oncology

## 2014-07-31 ENCOUNTER — Ambulatory Visit: Payer: PRIVATE HEALTH INSURANCE

## 2014-07-31 DIAGNOSIS — Z510 Encounter for antineoplastic radiation therapy: Secondary | ICD-10-CM | POA: Diagnosis not present

## 2014-08-01 ENCOUNTER — Ambulatory Visit: Payer: PRIVATE HEALTH INSURANCE

## 2014-08-01 ENCOUNTER — Ambulatory Visit
Admission: RE | Admit: 2014-08-01 | Discharge: 2014-08-01 | Disposition: A | Discharge status: Home / Self Care | Payer: PRIVATE HEALTH INSURANCE | Source: Ambulatory Visit | Attending: Radiation Oncology | Admitting: Radiation Oncology

## 2014-08-01 DIAGNOSIS — Z510 Encounter for antineoplastic radiation therapy: Secondary | ICD-10-CM | POA: Diagnosis not present

## 2014-08-02 ENCOUNTER — Ambulatory Visit
Admission: RE | Admit: 2014-08-02 | Discharge: 2014-08-02 | Disposition: A | Discharge status: Home / Self Care | Payer: PRIVATE HEALTH INSURANCE | Source: Ambulatory Visit | Attending: Radiation Oncology | Admitting: Radiation Oncology

## 2014-08-02 ENCOUNTER — Ambulatory Visit: Payer: PRIVATE HEALTH INSURANCE

## 2014-08-02 DIAGNOSIS — Z510 Encounter for antineoplastic radiation therapy: Secondary | ICD-10-CM | POA: Diagnosis not present

## 2014-08-05 ENCOUNTER — Ambulatory Visit: Payer: PRIVATE HEALTH INSURANCE

## 2014-08-05 ENCOUNTER — Encounter: Payer: Self-pay | Admitting: Radiation Oncology

## 2014-08-05 ENCOUNTER — Ambulatory Visit
Admission: RE | Admit: 2014-08-05 | Discharge: 2014-08-05 | Disposition: A | Discharge status: Home / Self Care | Payer: PRIVATE HEALTH INSURANCE | Source: Ambulatory Visit | Attending: Radiation Oncology | Admitting: Radiation Oncology

## 2014-08-05 VITALS — BP 179/93 | HR 64 | Temp 97.6°F | Resp 20 | Wt 187.0 lb

## 2014-08-05 DIAGNOSIS — Z510 Encounter for antineoplastic radiation therapy: Secondary | ICD-10-CM | POA: Diagnosis not present

## 2014-08-05 DIAGNOSIS — C61 Malignant neoplasm of prostate: Secondary | ICD-10-CM

## 2014-08-05 NOTE — Progress Notes (Signed)
Patient denies pain, states he continues to have occasional dysuria, noct x 2-3, soft to loose bm's but no more than 3 x daily and not every day.

## 2014-08-05 NOTE — Progress Notes (Signed)
Weekly Management Note:  Site:prostate Current Dose:  5460  cGy Projected Dose: 7800  cGy  Narrative: The patient is seen today for routine under treatment assessment. CBCT/MVCT images/port films were reviewed. The chart was reviewed.   Bladder filling is satisfactory, but can probably improve somewhat. No significant change in his GU or GI habits.  Physical Examination:  Filed Vitals:   08/05/14 0900  BP: 179/93  Pulse: 64  Temp: 97.6 F (36.4 C)  Resp: 20  .  Weight: 187 lb (84.823 kg). No change.  Impression: Tolerating radiation therapy well.  Plan: Continue radiation therapy as planned.

## 2014-08-06 ENCOUNTER — Ambulatory Visit
Admission: RE | Admit: 2014-08-06 | Discharge: 2014-08-06 | Disposition: A | Discharge status: Home / Self Care | Payer: PRIVATE HEALTH INSURANCE | Source: Ambulatory Visit | Attending: Radiation Oncology | Admitting: Radiation Oncology

## 2014-08-06 ENCOUNTER — Ambulatory Visit: Payer: PRIVATE HEALTH INSURANCE

## 2014-08-06 DIAGNOSIS — Z510 Encounter for antineoplastic radiation therapy: Secondary | ICD-10-CM | POA: Diagnosis not present

## 2014-08-07 ENCOUNTER — Ambulatory Visit
Admission: RE | Admit: 2014-08-07 | Discharge: 2014-08-07 | Disposition: A | Discharge status: Home / Self Care | Payer: PRIVATE HEALTH INSURANCE | Source: Ambulatory Visit | Attending: Radiation Oncology | Admitting: Radiation Oncology

## 2014-08-07 ENCOUNTER — Ambulatory Visit: Payer: PRIVATE HEALTH INSURANCE

## 2014-08-07 DIAGNOSIS — Z510 Encounter for antineoplastic radiation therapy: Secondary | ICD-10-CM | POA: Diagnosis not present

## 2014-08-08 ENCOUNTER — Ambulatory Visit: Payer: PRIVATE HEALTH INSURANCE

## 2014-08-08 ENCOUNTER — Ambulatory Visit
Admission: RE | Admit: 2014-08-08 | Discharge: 2014-08-08 | Disposition: A | Discharge status: Home / Self Care | Payer: PRIVATE HEALTH INSURANCE | Source: Ambulatory Visit | Attending: Radiation Oncology | Admitting: Radiation Oncology

## 2014-08-08 DIAGNOSIS — Z510 Encounter for antineoplastic radiation therapy: Secondary | ICD-10-CM | POA: Diagnosis not present

## 2014-08-09 ENCOUNTER — Ambulatory Visit
Admission: RE | Admit: 2014-08-09 | Discharge: 2014-08-09 | Disposition: A | Discharge status: Home / Self Care | Payer: PRIVATE HEALTH INSURANCE | Source: Ambulatory Visit | Attending: Radiation Oncology | Admitting: Radiation Oncology

## 2014-08-09 ENCOUNTER — Ambulatory Visit: Payer: PRIVATE HEALTH INSURANCE

## 2014-08-09 DIAGNOSIS — Z510 Encounter for antineoplastic radiation therapy: Secondary | ICD-10-CM | POA: Diagnosis not present

## 2014-08-11 ENCOUNTER — Ambulatory Visit
Admission: RE | Admit: 2014-08-11 | Discharge: 2014-08-11 | Disposition: A | Discharge status: Home / Self Care | Payer: PRIVATE HEALTH INSURANCE | Source: Ambulatory Visit | Attending: Radiation Oncology | Admitting: Radiation Oncology

## 2014-08-11 DIAGNOSIS — Z510 Encounter for antineoplastic radiation therapy: Secondary | ICD-10-CM | POA: Diagnosis not present

## 2014-08-12 ENCOUNTER — Ambulatory Visit
Admission: RE | Admit: 2014-08-12 | Discharge: 2014-08-12 | Disposition: A | Discharge status: Home / Self Care | Payer: PRIVATE HEALTH INSURANCE | Source: Ambulatory Visit | Attending: Radiation Oncology | Admitting: Radiation Oncology

## 2014-08-12 ENCOUNTER — Encounter: Payer: Self-pay | Admitting: Radiation Oncology

## 2014-08-12 ENCOUNTER — Ambulatory Visit: Payer: PRIVATE HEALTH INSURANCE

## 2014-08-12 VITALS — BP 188/92 | HR 58 | Temp 97.4°F | Resp 18 | Wt 194.8 lb

## 2014-08-12 DIAGNOSIS — Z510 Encounter for antineoplastic radiation therapy: Secondary | ICD-10-CM | POA: Diagnosis not present

## 2014-08-12 DIAGNOSIS — C61 Malignant neoplasm of prostate: Secondary | ICD-10-CM

## 2014-08-12 NOTE — Progress Notes (Signed)
Patient denies pain, fatigue, loss of appetite. He states his urinary/bowel issues are not changed. He states he checks his BP at home and keeps a log. He states it runs around 155/80. He states he has appt to return to PCP to see if BP meds need adjusting. He was started on Losartan 25 mg daily.

## 2014-08-12 NOTE — Progress Notes (Signed)
Weekly Management Note:  Site: Prostate Current Dose:  6660  cGy Projected Dose: 7800  cGy  Narrative: The patient is seen today for routine under treatment assessment. CBCT/MVCT images/port films were reviewed. The chart was reviewed.   Bladder filling is satisfactory.  No significant GU or GI difficulty.  Physical Examination:  Filed Vitals:   08/12/14 0903  BP: 188/92  Pulse: 58  Temp: 97.4 F (36.3 C)  Resp: 18  .  Weight: 194 lb 12.8 oz (88.361 kg).  No change.  Impression: Tolerating radiation therapy well.  Plan: Continue radiation therapy as planned.

## 2014-08-12 NOTE — Addendum Note (Signed)
Encounter addended by: Andria Rhein, RN on: 08/12/2014  9:59 AM<BR>     Documentation filed: Inpatient Document Flowsheet

## 2014-08-13 ENCOUNTER — Ambulatory Visit: Payer: PRIVATE HEALTH INSURANCE

## 2014-08-13 ENCOUNTER — Ambulatory Visit
Admission: RE | Admit: 2014-08-13 | Discharge: 2014-08-13 | Disposition: A | Discharge status: Home / Self Care | Payer: PRIVATE HEALTH INSURANCE | Source: Ambulatory Visit | Attending: Radiation Oncology | Admitting: Radiation Oncology

## 2014-08-13 DIAGNOSIS — Z510 Encounter for antineoplastic radiation therapy: Secondary | ICD-10-CM | POA: Diagnosis not present

## 2014-08-14 ENCOUNTER — Ambulatory Visit: Payer: PRIVATE HEALTH INSURANCE

## 2014-08-14 ENCOUNTER — Ambulatory Visit
Admission: RE | Admit: 2014-08-14 | Discharge: 2014-08-14 | Disposition: A | Discharge status: Home / Self Care | Payer: PRIVATE HEALTH INSURANCE | Source: Ambulatory Visit | Attending: Radiation Oncology | Admitting: Radiation Oncology

## 2014-08-14 DIAGNOSIS — Z510 Encounter for antineoplastic radiation therapy: Secondary | ICD-10-CM | POA: Diagnosis not present

## 2014-08-16 ENCOUNTER — Ambulatory Visit: Payer: PRIVATE HEALTH INSURANCE

## 2014-08-19 ENCOUNTER — Encounter: Payer: Self-pay | Admitting: Radiation Oncology

## 2014-08-19 ENCOUNTER — Ambulatory Visit
Admission: RE | Admit: 2014-08-19 | Discharge: 2014-08-19 | Disposition: A | Discharge status: Home / Self Care | Payer: PRIVATE HEALTH INSURANCE | Source: Ambulatory Visit | Attending: Radiation Oncology | Admitting: Radiation Oncology

## 2014-08-19 ENCOUNTER — Ambulatory Visit: Payer: PRIVATE HEALTH INSURANCE

## 2014-08-19 VITALS — BP 182/93 | HR 66 | Temp 97.5°F | Resp 20 | Wt 196.1 lb

## 2014-08-19 DIAGNOSIS — C61 Malignant neoplasm of prostate: Secondary | ICD-10-CM

## 2014-08-19 DIAGNOSIS — Z510 Encounter for antineoplastic radiation therapy: Secondary | ICD-10-CM | POA: Diagnosis not present

## 2014-08-19 NOTE — Progress Notes (Signed)
Weekly Management Note:  Site: Prostate Current Dose:  7015  cGy Projected Dose: 7800  cGy  Narrative: The patient is seen today for routine under treatment assessment. CBCT/MVCT images/port films were reviewed. The chart was reviewed.   Bladder filling is satisfactory.  No significant GU or GI difficulties.  Physical Examination:  Filed Vitals:   08/19/14 0858  BP: 182/93  Pulse: 66  Temp: 97.5 F (36.4 C)  Resp: 20  .  Weight: 196 lb 1.6 oz (88.95 kg).  No change.  Impression: Tolerating radiation therapy well.  He will finish his radiation therapy this Thursday.  Plan: Continue radiation therapy as planned.

## 2014-08-19 NOTE — Progress Notes (Addendum)
Patient denies pain, urinary/bowel issues, loss of appetite. He states he is going to bed earlier at night. He completes Thurs; gave him 1 month FU card. He states he continues to monitor his BP at home, and it ranges 140/80. He has FU with PCP in 2 weeks.

## 2014-08-20 ENCOUNTER — Ambulatory Visit
Admission: RE | Admit: 2014-08-20 | Discharge: 2014-08-20 | Disposition: A | Discharge status: Home / Self Care | Payer: PRIVATE HEALTH INSURANCE | Source: Ambulatory Visit | Attending: Radiation Oncology | Admitting: Radiation Oncology

## 2014-08-20 ENCOUNTER — Ambulatory Visit: Payer: PRIVATE HEALTH INSURANCE

## 2014-08-20 DIAGNOSIS — Z510 Encounter for antineoplastic radiation therapy: Secondary | ICD-10-CM | POA: Diagnosis not present

## 2014-08-21 ENCOUNTER — Ambulatory Visit: Payer: PRIVATE HEALTH INSURANCE

## 2014-08-21 ENCOUNTER — Ambulatory Visit
Admission: RE | Admit: 2014-08-21 | Discharge: 2014-08-21 | Disposition: A | Discharge status: Home / Self Care | Payer: PRIVATE HEALTH INSURANCE | Source: Ambulatory Visit | Attending: Radiation Oncology | Admitting: Radiation Oncology

## 2014-08-21 DIAGNOSIS — Z510 Encounter for antineoplastic radiation therapy: Secondary | ICD-10-CM | POA: Diagnosis not present

## 2014-08-22 ENCOUNTER — Ambulatory Visit
Admission: RE | Admit: 2014-08-22 | Discharge: 2014-08-22 | Disposition: A | Discharge status: Home / Self Care | Payer: PRIVATE HEALTH INSURANCE | Source: Ambulatory Visit | Attending: Radiation Oncology | Admitting: Radiation Oncology

## 2014-08-22 ENCOUNTER — Ambulatory Visit: Payer: PRIVATE HEALTH INSURANCE

## 2014-08-22 DIAGNOSIS — Z510 Encounter for antineoplastic radiation therapy: Secondary | ICD-10-CM | POA: Diagnosis not present

## 2014-08-23 ENCOUNTER — Ambulatory Visit: Payer: PRIVATE HEALTH INSURANCE

## 2014-08-24 ENCOUNTER — Encounter: Payer: Self-pay | Admitting: Radiation Oncology

## 2014-08-24 NOTE — Progress Notes (Signed)
[name]  Date: 08/24/2014 XKP:[id number] DOB:1956-04-27   Status:outpatient    CC: [name], MD  Dr. Rana Snare  REFERRING PHYSICIAN:   Dr. 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 Banfill tolerated his treatment beautifully with no significant GU or GI toxicity by completion of therapy.                         PLAN: Routine followup in one month. Patient instructed to call if questions or worsening complaints in interim.

## 2014-08-26 ENCOUNTER — Ambulatory Visit: Payer: PRIVATE HEALTH INSURANCE

## 2014-08-27 ENCOUNTER — Ambulatory Visit: Payer: PRIVATE HEALTH INSURANCE

## 2014-08-28 ENCOUNTER — Ambulatory Visit: Payer: PRIVATE HEALTH INSURANCE

## 2014-08-29 ENCOUNTER — Ambulatory Visit: Payer: PRIVATE HEALTH INSURANCE

## 2014-08-30 ENCOUNTER — Ambulatory Visit: Payer: PRIVATE HEALTH INSURANCE

## 2014-09-02 ENCOUNTER — Ambulatory Visit: Payer: PRIVATE HEALTH INSURANCE

## 2014-09-03 ENCOUNTER — Ambulatory Visit: Payer: PRIVATE HEALTH INSURANCE

## 2014-09-04 ENCOUNTER — Ambulatory Visit: Payer: PRIVATE HEALTH INSURANCE

## 2014-09-05 ENCOUNTER — Ambulatory Visit: Payer: PRIVATE HEALTH INSURANCE

## 2014-09-06 ENCOUNTER — Ambulatory Visit: Payer: PRIVATE HEALTH INSURANCE

## 2014-09-09 ENCOUNTER — Ambulatory Visit: Payer: PRIVATE HEALTH INSURANCE

## 2014-09-10 ENCOUNTER — Ambulatory Visit: Payer: PRIVATE HEALTH INSURANCE

## 2014-09-11 ENCOUNTER — Ambulatory Visit: Payer: PRIVATE HEALTH INSURANCE

## 2014-09-12 ENCOUNTER — Ambulatory Visit: Payer: PRIVATE HEALTH INSURANCE

## 2014-09-16 ENCOUNTER — Encounter: Payer: Self-pay | Admitting: Radiation Oncology

## 2014-09-16 ENCOUNTER — Ambulatory Visit: Payer: PRIVATE HEALTH INSURANCE

## 2014-09-17 ENCOUNTER — Ambulatory Visit: Payer: PRIVATE HEALTH INSURANCE

## 2014-09-18 ENCOUNTER — Ambulatory Visit: Payer: PRIVATE HEALTH INSURANCE

## 2014-09-19 ENCOUNTER — Ambulatory Visit: Payer: PRIVATE HEALTH INSURANCE

## 2014-09-23 ENCOUNTER — Ambulatory Visit: Payer: PRIVATE HEALTH INSURANCE

## 2014-09-24 ENCOUNTER — Ambulatory Visit
Admission: RE | Admit: 2014-09-24 | Discharge: 2014-09-24 | Disposition: A | Discharge status: Home / Self Care | Payer: No Typology Code available for payment source | Source: Ambulatory Visit | Attending: Radiation Oncology | Admitting: Radiation Oncology

## 2014-09-24 ENCOUNTER — Encounter: Payer: Self-pay | Admitting: Radiation Oncology

## 2014-09-24 ENCOUNTER — Ambulatory Visit: Payer: PRIVATE HEALTH INSURANCE

## 2014-09-24 VITALS — BP 168/83 | HR 68 | Temp 97.6°F | Resp 18 | Wt 190.8 lb

## 2014-09-24 DIAGNOSIS — C61 Malignant neoplasm of prostate: Secondary | ICD-10-CM

## 2014-09-24 HISTORY — DX: Personal history of irradiation: Z92.3

## 2014-09-24 NOTE — Progress Notes (Signed)
Patient denies pain, urinary/bowel issues, fatigue, loss of appetite. He states he had slight dysuria late into treatment, but this has resolved. He has labs with urologist on 10/08/14 and FU with Dr Risa Grill 10/15/14.

## 2014-09-24 NOTE — Progress Notes (Signed)
CC: Dr. Rana Snare  Follow-up note:  Dennis Lewis returns today approximately 1 month following completion of external beam IMRT in the management of his stage TIc versus T2b intermediate risk adenocarcinoma prostate.  He remains without GU or GI difficulties.  He will see Dr. Risa Grill on January 26.  Physical examination: Alert and oriented. Filed Vitals:   09/24/14 1036  BP: 168/83  Pulse: 68  Temp: 97.6 F (36.4 C)  Resp: 18   Rectal examination not performed today.  Impression: Satisfactory progress with no radiation therapy sequelae.  Plan: Follow-up through Dr. Risa Grill.  I have not scheduled Mr. Plotts for a formal follow-up visit and I ask that Dr. Risa Grill keep me posted on his progress/PSA determinations.

## 2014-09-25 ENCOUNTER — Ambulatory Visit: Payer: PRIVATE HEALTH INSURANCE

## 2014-11-28 ENCOUNTER — Encounter: Payer: Self-pay | Admitting: Gastroenterology

## 2015-01-30 ENCOUNTER — Ambulatory Visit: Payer: No Typology Code available for payment source | Admitting: Cardiology

## 2015-03-20 ENCOUNTER — Encounter: Payer: Self-pay | Admitting: Cardiology

## 2015-03-20 ENCOUNTER — Ambulatory Visit (INDEPENDENT_AMBULATORY_CARE_PROVIDER_SITE_OTHER): Payer: No Typology Code available for payment source | Admitting: Cardiology

## 2015-03-20 VITALS — BP 158/90 | HR 62 | Ht 69.0 in | Wt 189.7 lb

## 2015-03-20 DIAGNOSIS — R002 Palpitations: Secondary | ICD-10-CM | POA: Diagnosis not present

## 2015-03-20 DIAGNOSIS — I10 Essential (primary) hypertension: Secondary | ICD-10-CM | POA: Diagnosis not present

## 2015-03-20 DIAGNOSIS — K219 Gastro-esophageal reflux disease without esophagitis: Secondary | ICD-10-CM | POA: Diagnosis not present

## 2015-03-20 NOTE — Patient Instructions (Addendum)
Restart amlodipine at 5 mg daily and follow your blood pressure. Limit alcohol to 2 beers daily. Try ranitidine or another H2 blocker instead of omeprazole or Nexium for your stomach. We will order a 30-day event monitor, you will come to the [facility]. office to get it put on and then follow up with Dr [name]. Please document the frequency and duration of your palpitations.  Continue the metoprolol. Congratulations on exercising regularly and not smoking. Since your father had esophageal cancer, make sure you get the screening tests recommended by your physicians.

## 2015-03-20 NOTE — Progress Notes (Signed)
Cardiology Office Note   Date:  03/20/2015   ID:  Dennis Lewis, DOB Jul 09, 1956, MRN [id number]  PCP:  Dennis Redwood, MD  Cardiologist:  Dr Dennis Lewis  Dennis Willert, PA-C   Chief Complaint  Patient presents with  . Follow-up    Infrequent episodes of palpitations.  Stopped all meds except Metoprolol stating they all caused increased palpitations.    History of Present Illness: Dennis Lewis is a 59 y.o. male with a history of palpitations, nl cors by cath 1990, Adenosine MV 2010, echo w/ EF 45% 2010. Last seen by Dr Dennis Lewis 2014 for palpitations, he recommended continuing BB and continuing to avoid caffeine/stimulants.   Dennis Lewis presents for  Evaluation of palpitations. He has had them in the past but generally they were single beats at a time. He might have them frequently and a day but this improved on the metoprolol. Recently, the palpitations increased and he stopped all of his medications except for the metoprolol. He is continuing to avoid caffeine and decongestants, but also continues to drink about a sixpack of beer nightly.  He exercises by walking 4 miles 4 times a week.   2 nights ago, he had sudden onset of continuous palpitations that started just before bedtime. He was not able to get much rest that night. He felt his heart continuously flipping and flopping.  The next morning, it stopped suddenly just before 9 AM. Total duration was about 10 hours. He gets this type of an episode approximately 4 times a year.  This is different from his regular palpitations in that the palpitations are continuous and last for prolonged period of time.  He does not get chest pain, shortness of breath , presyncope or other symptoms associated with it.  He is not aware of any precipitating event or factor.   He is currently asymptomatic.   Past Medical History  Diagnosis Date  . Palpitations   . Hypertension   . Left bundle branch block   . Hyperlipidemia   . Torn tendon     right  shoulder  . Anxiety   . Prostate cancer 04/02/14    Gleason 7, volume 30 gm  . GERD (gastroesophageal reflux disease)   . S/P radiation therapy 06/26/2014 through 08/22/2014                                                       Prostate 7800 cGy in 40 sessions                        . H/O cardiovascular stress test 2010    [name]  . H/O echocardiogram 2010     mild LVH with some septal dyssynergy, EF 45%, impaired relaxation    Past Surgical History  Procedure Laterality Date  . Cardiac catheterization  1990    Normal cors  . Back surgery  1993  . Prostate biopsy  04/02/14    Gleason 7, vol 30 gm    Current Outpatient Prescriptions  Medication Sig Dispense Refill  . metoprolol succinate (TOPROL-XL) 100 MG 24 hr tablet Take 100 mg by mouth daily.  5   No current facility-administered medications for this visit.    Allergies:   Ace inhibitors    Social History:  The patient  reports that he quit smoking about 36 years ago. His smoking use included Cigarettes. He has never used smokeless tobacco. He reports that he drinks alcohol. He reports that he does not use illicit drugs.   Family History:  The patient's family history includes Esophageal cancer in his father; Prostate cancer in his brother; Throat cancer (age of onset: 72) in his father. There is no history of Colon cancer, Diabetes, or Colon polyps.    ROS:  Please see the history of present illness. All other systems are reviewed and negative.    PHYSICAL EXAM: VS:  BP 158/90 mmHg  Lewis 61  Ht 5\' 9"  (1.753 m)  Wt 189 lb 11.2 oz (86.047 kg)  BMI 28.00 kg/m2 , BMI Body mass index is 28 kg/(m^2). GEN: Well nourished, well developed, in no acute distress HEENT: normal Neck: no JVD, carotid bruits, or masses Cardiac: RRR; no murmurs, rubs, or gallops,no edema.  Distal pulses are 2+ in all 4 extremities  Respiratory:  clear to auscultation bilaterally, normal work of breathing GI: soft, nontender,  nondistended, + BS MS: no deformity or atrophy Skin: warm and dry, no rash Neuro:  Strength and sensation are intact Psych: euthymic mood, full affect   EKG:  EKG is ordered today. The ekg ordered today demonstrates  Sinus rhythm, rate 62, left bundle branch block is old.   Wt Readings from Last 3 Encounters:  03/20/15 189 lb 11.2 oz (86.047 kg)  09/24/14 190 lb 12.8 oz (86.546 kg)  08/19/14 196 lb 1.6 oz (88.95 kg)     Other studies Reviewed: Additional studies/ records that were reviewed today include:  Previous office visits and ECGs.  ASSESSMENT AND PLAN:  1.   Palpitations: discussed the different options available with Dennis Lewis. I recommended a 30 day event monitor and this has been ordered. He will follow-up with Dr. [name] after this. He is to continue his metoprolol at the current dose , with a resting heart rate in the 60s , do not feel we can increase it. He is requested to start his medications back one at a time to see if there was one particular medication causing his palpitations.   2. Hypertension : I requested he reinitiate the amlodipine as he agrees that his blood pressure  Is high. I advised that he can  Work with Dr. Brigitte Lewis to increase the amlodipine if he is able to increase that without side effects or palpitations.   3. GERD: advised him that he could restart the omeprazole or changed to ranitidine or other H2 blocker if he wished. If he feels the proton pump inhibitors a better choice, he can try Nexium.   Current medicines are reviewed at length with the patient today.  The patient does not have concerns regarding medicines.  The following changes have been made:  Restart amlodipine,  Restart GERD medication but can change to an H2 blocker or another PPI such as Nexium if he wishes  Labs/ tests ordered today include:  30 day event monitor  Orders Placed This Encounter  Procedures  . Cardiac event monitor  . EKG 12-Lead     Disposition:   FU with Dr  Dennis Lewis in 6 weeks  Signed, Dennis Lewis  03/20/2015 9:34 AM    [address], [place], [place]  [address] Phone: [phone]; Fax: [phone]

## 2015-04-03 ENCOUNTER — Encounter: Payer: Self-pay | Admitting: *Deleted

## 2015-04-03 NOTE — Progress Notes (Signed)
Patient ID: Dennis Lewis, male   DOB: January 13, 1956, 59 y.o.   MRN: [id number] Patient did not show up for 04/03/2015 8:30 AM appointment to have a 30 day cardiac event monitor applied.

## 2015-05-07 ENCOUNTER — Ambulatory Visit: Payer: No Typology Code available for payment source | Admitting: Cardiology

## 2015-05-21 ENCOUNTER — Encounter: Payer: Self-pay | Admitting: Gastroenterology

## 2015-07-15 ENCOUNTER — Ambulatory Visit (AMBULATORY_SURGERY_CENTER): Payer: Self-pay

## 2015-07-15 VITALS — Ht 69.0 in | Wt 182.4 lb

## 2015-07-15 DIAGNOSIS — Z1211 Encounter for screening for malignant neoplasm of colon: Secondary | ICD-10-CM

## 2015-07-15 MED ORDER — SUPREP BOWEL PREP KIT 17.5-3.13-1.6 GM/177ML PO SOLN: 1.0000 | Freq: Once | ORAL | Status: DC

## 2015-07-15 NOTE — Progress Notes (Signed)
No allergies to eggs or soy No home oxygen No diet/weight loss meds No past problems with anesthesia  Refused emmi; has internet

## 2015-07-29 ENCOUNTER — Ambulatory Visit (AMBULATORY_SURGERY_CENTER): Payer: No Typology Code available for payment source | Admitting: Gastroenterology

## 2015-07-29 ENCOUNTER — Encounter: Payer: Self-pay | Admitting: Gastroenterology

## 2015-07-29 VITALS — BP 150/79 | HR 60 | Temp 97.5°F | Resp 14 | Ht 69.0 in | Wt 182.0 lb

## 2015-07-29 DIAGNOSIS — Z1211 Encounter for screening for malignant neoplasm of colon: Secondary | ICD-10-CM

## 2015-07-29 MED ORDER — SODIUM CHLORIDE 0.9 % IV SOLN: 500.0000 mL | INTRAVENOUS | Status: DC

## 2015-07-29 NOTE — Progress Notes (Signed)
A/ox3 pleased with MAC, report to Sheila RN 

## 2015-07-29 NOTE — Patient Instructions (Signed)
YOU HAD AN ENDOSCOPIC PROCEDURE TODAY AT [facility]:   Refer to the procedure report that was given to you for any specific questions about what was found during the examination.  If the procedure report does not answer your questions, please call your gastroenterologist to clarify.  If you requested that your care partner not be given the details of your procedure findings, then the procedure report has been included in a sealed envelope for you to review at your convenience later.  YOU SHOULD EXPECT: Some feelings of bloating in the abdomen. Passage of more gas than usual.  Walking can help get rid of the air that was put into your GI tract during the procedure and reduce the bloating. If you had a lower endoscopy (such as a colonoscopy or flexible sigmoidoscopy) you may notice spotting of blood in your stool or on the toilet paper. If you underwent a bowel prep for your procedure, you may not have a normal bowel movement for a few days.  Please Note:  You might notice some irritation and congestion in your nose or some drainage.  This is from the oxygen used during your procedure.  There is no need for concern and it should clear up in a day or so.  SYMPTOMS TO REPORT IMMEDIATELY:   Following lower endoscopy (colonoscopy or flexible sigmoidoscopy):  Excessive amounts of blood in the stool  Significant tenderness or worsening of abdominal pains  Swelling of the abdomen that is new, acute  Fever of 100F or higher    For urgent or emergent issues, a gastroenterologist can be reached at any hour by calling [phone].   DIET: Your first meal following the procedure should be a small meal and then it is ok to progress to your normal diet. Heavy or fried foods are harder to digest and may make you feel nauseous or bloated.  Likewise, meals heavy in dairy and vegetables can increase bloating.  Drink plenty of fluids but you should avoid alcoholic beverages for 24  hours.  ACTIVITY:  You should plan to take it easy for the rest of today and you should NOT DRIVE or use heavy machinery until tomorrow (because of the sedation medicines used during the test).    FOLLOW UP: Our staff will call the number listed on your records the next business day following your procedure to check on you and address any questions or concerns that you may have regarding the information given to you following your procedure. If we do not reach you, we will leave a message.  However, if you are feeling well and you are not experiencing any problems, there is no need to return our call.  We will assume that you have returned to your regular daily activities without incident.  If any biopsies were taken you will be contacted by phone or by letter within the next 1-3 weeks.  Please call us at [phone] if you have not heard about the biopsies in 3 weeks.    SIGNATURES/CONFIDENTIALITY: You and/or your care partner have signed paperwork which will be entered into your electronic medical record.  These signatures attest to the fact that that the information above on your After Visit Summary has been reviewed and is understood.  Full responsibility of the confidentiality of this discharge information lies with you and/or your care-partner.   Resume medications.Information given proctitis.

## 2015-07-29 NOTE — Op Note (Signed)
[facility]  [address] [place], [id number]   COLONOSCOPY PROCEDURE REPORT  PATIENT: Corky, Blumstein  MR#: [id number] BIRTHDATE: [birth year] , 59  yrs. old GENDER: male ENDOSCOPIST: Milus Banister, MD PROCEDURE DATE:  07/29/2015 PROCEDURE:   Colonoscopy, screening First Screening Colonoscopy - Avg.  risk and is 50 yrs.  old or older Yes.  Prior Negative Screening - Now for repeat screening. N/A  History of Adenoma - Now for follow-up colonoscopy & has been > or = to 3 yrs.  N/A  Recommend repeat exam, <10 yrs? No ASA CLASS:   Class II INDICATIONS:Screening for colonic neoplasia and Colorectal Neoplasm Risk Assessment for this procedure is average risk. MEDICATIONS: Monitored anesthesia care and Propofol 300 mg IV  DESCRIPTION OF PROCEDURE:   After the risks benefits and alternatives of the procedure were thoroughly explained, informed consent was obtained.  The digital rectal exam revealed no abnormalities of the rectum.   The [id number]  endoscope was introduced through the anus and advanced to the cecum, which was identified by both the appendix and ileocecal valve. No adverse events experienced.   The quality of the prep was excellent.  The instrument was then slowly withdrawn as the colon was fully examined. Estimated blood loss is zero unless otherwise noted in this procedure report.   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  The examination was otherwise normal.  Retroflexed views revealed no abnormalities. The time to cecum = 1.4 Withdrawal time = 7.5   The scope was withdrawn and the procedure completed. COMPLICATIONS: There were no immediate complications.  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  The examination was otherwise normal  RECOMMENDATIONS: You should continue to follow  colorectal cancer screening guidelines for "routine risk" patients with a repeat colonoscopy in 10 years. If you have persistent rectal bleeding, please call and we could ablate the radiation damage to your rectum.  eSigned:  Milus Banister, MD 07/29/2015 8:48 AM

## 2015-07-30 ENCOUNTER — Telehealth: Payer: Self-pay

## 2015-07-30 NOTE — Telephone Encounter (Signed)
  Follow up Call-  Call back number 07/29/2015  Post procedure Call Back phone  # [phone]  Permission to leave phone message Yes     Patient questions:  Do you have a fever, pain , or abdominal swelling? No. Pain Score  0 *  Have you tolerated food without any problems? Yes.    Have you been able to return to your normal activities? Yes.    Do you have any questions about your discharge instructions: Diet   No. Medications  No. Follow up visit  No.  Do you have questions or concerns about your Care? No.  Actions: * If pain score is 4 or above: No action needed, pain <4.  Per the pt, "Earlie Server are wonderful".  Not complaints noted. maw

## 2015-08-06 ENCOUNTER — Encounter (INDEPENDENT_AMBULATORY_CARE_PROVIDER_SITE_OTHER): Payer: No Typology Code available for payment source

## 2015-08-06 ENCOUNTER — Encounter: Payer: Self-pay | Admitting: Physician Assistant

## 2015-08-06 ENCOUNTER — Ambulatory Visit (INDEPENDENT_AMBULATORY_CARE_PROVIDER_SITE_OTHER): Payer: No Typology Code available for payment source | Admitting: Physician Assistant

## 2015-08-06 VITALS — BP 142/82 | HR 61 | Ht 69.0 in | Wt 182.7 lb

## 2015-08-06 DIAGNOSIS — R002 Palpitations: Secondary | ICD-10-CM

## 2015-08-06 MED ORDER — METOPROLOL SUCCINATE ER 100 MG PO TB24: 100.0000 mg | ORAL_TABLET | Freq: Every day | ORAL | Status: DC

## 2015-08-06 NOTE — Patient Instructions (Signed)
Your physician recommends that you schedule a follow-up appointment in: 1 Month with Gaspar Bidding or [facility] has recommended that you wear an event monitor. Event monitors are medical devices that record the heart's electrical activity. Doctors most often [place] these monitors to diagnose arrhythmias. Arrhythmias are problems with the speed or rhythm of the heartbeat. The monitor is a small, portable device. You can wear one while you do your normal daily activities. This is usually used to diagnose what is causing palpitations/syncope (passing out).

## 2015-08-06 NOTE — Progress Notes (Signed)
Patient ID: Dennis Lewis, male   DOB: 1955-12-29, 59 y.o.   MRN: [id number]    Date:  08/06/2015   ID:  [name], DOB 05-23-1956, MRN [id number]  PCP:  [name], MD  Primary Cardiologist:  [name]  No chief complaint on file.    History of Present Illness: Dennis Lewis is a 59 y.o. male with a history of palpitations, nl cors by cath 1990, Adenosine MV 2010, echo w/ EF 45% 2010. Last seen by Dr [name] 2014 for palpitations, he recommended continuing BB and continuing to avoid caffeine/stimulants.   Dennis Lewis presents for Evaluation of palpitations back in June 2016. At the time the palpitations increased and he stopped all of his medications except for the metoprolol. He continued to avoid caffeine and decongestants, but also continued to drink about a sixpack of beer nightly. He exercises by walking 4 miles 4 times a week.  He plays a lot of golf. 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   Patient presents today for follow-up of the palpitations. He continues to have have palpitations which seems to wake up and about between 0200 hrs. and 0300 hrs. they last until about 0600 hrs.  He was in [place] about 3 weeks ago and seemed to be having them more frequently.   He describes it as "flip flopping" of his heart.   He recently was started on Valium when necessary for anxiety.   He denies dizziness, chest pain, shortness of breath with this sensation. He also denies nausea, vomiting, fever, orthopnea, PND, cough, congestion, abdominal pain, hematochezia, melena, lower extremity edema, claudication.  Wt Readings from Last 3 Encounters:  08/06/15 182 lb 11.2 oz (82.872 kg)  07/29/15 182 lb (82.555 kg)  07/15/15 182 lb 6.4 oz (82.736 kg)     Past Medical History  Diagnosis Date  . Palpitations   . Hypertension   . Left bundle branch block   . Hyperlipidemia   . Torn tendon     right shoulder  . Anxiety   .  Prostate cancer ([place]) 04/02/14    Gleason 7, volume 30 gm  . GERD (gastroesophageal reflux disease)   . S/P radiation therapy 06/26/2014 through 08/22/2014                                                       Prostate 7800 cGy in 40 sessions                        . H/O cardiovascular stress test 2010    [name]  . H/O echocardiogram 2010     mild LVH with some septal dyssynergy, EF 45%, impaired relaxation    Current Outpatient Prescriptions  Medication Sig Dispense Refill  . metoprolol succinate (TOPROL-XL) 100 MG 24 hr tablet Take 100 mg by mouth daily.  5  . Sildenafil Citrate (VIAGRA PO) Take 5 mg by mouth daily as needed (ERECTILE DYSFUNCTION).     No current facility-administered medications for this visit.    Allergies:    Allergies  Allergen Reactions  . Ace Inhibitors Other (See Comments)    Lip swelling/blisters     Social History:  The patient  reports that he  quit smoking about 36 years ago. His smoking use included Cigarettes. He has never used smokeless tobacco. He reports that he drinks alcohol. He reports that he does not use illicit drugs.   Family history:   Family History  Problem Relation Age of Onset  . Throat cancer Father 8    smoker  . Prostate cancer Brother     surgery  . Colon cancer Neg Hx   . Diabetes Neg Hx   . Esophageal cancer Father   . Colon polyps Neg Hx     ROS:  Please see the history of present illness.  All other systems reviewed and negative.   PHYSICAL EXAM: VS:  BP 142/82 mmHg  Pulse 61  Ht 5\' 9"  (1.753 m)  Wt 182 lb 11.2 oz (82.872 kg)  BMI 26.97 kg/m2  SpO2 96% Well nourished, well developed, in no acute distress HEENT: Pupils are equal round react to light accommodation extraocular movements are intact.  Neck: no JVDNo cervical lymphadenopathy. Cardiac: Regular rate and rhythm without murmurs rubs or gallops. Lungs:  clear to auscultation bilaterally, no wheezing, rhonchi or rales Abd: soft, nontender,  positive bowel sounds all quadrants, no hepatosplenomegaly Ext: no lower extremity edema.  2+ radial and dorsalis pedis pulses. Skin: warm and dry Neuro:  Grossly normal  ASSESSMENT AND PLAN:  Problem List Items Addressed This Visit    Palpitations - Primary     Palpitations:  Patient continues to have palpitations which seem to be awakened him up between 0200hrs  And 0300 hours. He denies any caffeine intake. He is taking metoprolol 100 mg daily.  He says it's occurring right now approximately 1-2 times per week. We'll try a two-week event monitor and see if we can capture what he is feeling.  Follow-up in one month with me 3 months with Dr. [name]

## 2015-08-21 ENCOUNTER — Encounter: Payer: Self-pay | Admitting: Physician Assistant

## 2015-08-21 ENCOUNTER — Telehealth: Payer: Self-pay | Admitting: Physician Assistant

## 2015-08-21 DIAGNOSIS — I472 Ventricular tachycardia: Secondary | ICD-10-CM | POA: Insufficient documentation

## 2015-08-21 DIAGNOSIS — I4729 Other ventricular tachycardia: Secondary | ICD-10-CM | POA: Insufficient documentation

## 2015-08-21 NOTE — Telephone Encounter (Signed)
Nonsustained ventricular tachycardia longest run 11 beats found during cardiac monitoring. 2-D echocardiogram will be ordered.  The results of been discussed with the patient's wife.  Tarri Fuller PAC

## 2015-08-22 ENCOUNTER — Telehealth: Payer: Self-pay

## 2015-08-22 DIAGNOSIS — I472 Ventricular tachycardia: Secondary | ICD-10-CM

## 2015-08-22 DIAGNOSIS — I4729 Other ventricular tachycardia: Secondary | ICD-10-CM

## 2015-08-22 NOTE — Telephone Encounter (Signed)
   Notes Recorded by Brett Canales, PA-C on 08/21/2015 at 5:20 PM Please order an echocardiogram for NSVT found during Event monitor. I already spoke to the patient regarding the results.   Will send a message to [facility] the echo scheduler.

## 2015-08-25 ENCOUNTER — Telehealth (HOSPITAL_COMMUNITY): Payer: Self-pay | Admitting: *Deleted

## 2015-09-05 ENCOUNTER — Encounter: Payer: Self-pay | Admitting: Physician Assistant

## 2015-09-05 ENCOUNTER — Ambulatory Visit (INDEPENDENT_AMBULATORY_CARE_PROVIDER_SITE_OTHER): Payer: No Typology Code available for payment source | Admitting: Physician Assistant

## 2015-09-05 VITALS — BP 184/92 | HR 59 | Ht 69.0 in | Wt 181.6 lb

## 2015-09-05 DIAGNOSIS — R002 Palpitations: Secondary | ICD-10-CM

## 2015-09-05 DIAGNOSIS — I472 Ventricular tachycardia: Secondary | ICD-10-CM | POA: Diagnosis not present

## 2015-09-05 DIAGNOSIS — I10 Essential (primary) hypertension: Secondary | ICD-10-CM

## 2015-09-05 DIAGNOSIS — I4729 Other ventricular tachycardia: Secondary | ICD-10-CM

## 2015-09-05 DIAGNOSIS — I447 Left bundle-branch block, unspecified: Secondary | ICD-10-CM

## 2015-09-05 MED ORDER — HYDRALAZINE HCL 25 MG PO TABS: 25.0000 mg | ORAL_TABLET | Freq: Two times a day (BID) | ORAL | Status: DC

## 2015-09-05 NOTE — Patient Instructions (Addendum)
Your physician has recommended you make the following change in your medication: start new prescription for hydralazine. This has been sent to your [facility].  Your physician recommends that you schedule a follow-up appointment in: 2 months with Dr [name].  Have echo as planned.

## 2015-09-05 NOTE — Progress Notes (Signed)
Patient ID: Dennis Lewis, male   DOB: [birth year], 59 y.o.   MRN: [id number]    Date:  09/05/2015   ID:  Dennis Lewis, DOB [birth year], MRN [id number]  PCP:  [name], MD  Primary Cardiologist:  [name]  Chief Complaint  Patient presents with  . Follow-up    1 mo//pt states last Saturday, felt palpitations and chest discomfort from 12:30 pm until Sunday morning, nothing since.//pt states he has been having this problem since he started taking metoprolol, never felt them before that.  . Excessive Sweating    during the palpitations     History of Present Illness: Dennis Lewis is a 59 y.o. male with a history of palpitations, nl cors by cath 1990, Adenosine MV 2010, echo w/ EF 45% 2010. Last seen by Dr [name] 2014 for palpitations, he recommended continuing BB and continuing to avoid caffeine/stimulants.   [name] presents for Evaluation of palpitations back in June 2016. At the time the palpitations increased and he stopped all of his medications except for the metoprolol. He continued to avoid caffeine and decongestants, but also continued to drink about a sixpack of beer nightly. He exercises by walking 4 miles 4 times a week. He plays a lot of golf. 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    I saw on November 16 because he continued to complain of palpitations which seems to wake up and about between 0200 hrs. and 0300 hrs. they last until about 0600 hrs. He was in [place] in October and seemed to be having them more frequently. He describes it as "flip flopping" of his heart. He recently was started on Valium when necessary for anxiety.  He is here for follow-up.  Event monitor showed frequent PVCs, junctional rhythm and NSVT (see below). Is a known left bundle branch block. I ordered a 2-D echocardiogram which has not been completed yet.  Reports when he has it more extensive episode of palpitations he  gets very uncomfortable feeling in his chest he denies pressure pain or tightness.  He also denies nausea, vomiting, fever, shortness of breath, orthopnea, dizziness, PND, cough, congestion, abdominal pain, hematochezia, melena, lower extremity edema, claudication.  He does drink 8 beers per night which she says helps him relax.      Wt Readings from Last 3 Encounters:  09/05/15 181 lb 9.6 oz (82.373 kg)  08/06/15 182 lb 11.2 oz (82.872 kg)  07/29/15 182 lb (82.555 kg)     Past Medical History  Diagnosis Date  . Palpitations   . Hypertension   . Left bundle branch block   . Hyperlipidemia   . Torn tendon     right shoulder  . Anxiety   . Prostate cancer ([place]) 04/02/14    Gleason 7, volume 30 gm  . GERD (gastroesophageal reflux disease)   . S/P radiation therapy 06/26/2014 through 08/22/2014                                                       Prostate 7800 cGy in 40 sessions                        . H/O cardiovascular stress test 2010  [name]  . H/O echocardiogram 2010     mild LVH with some septal dyssynergy, EF 45%, impaired relaxation    Current Outpatient Prescriptions  Medication Sig Dispense Refill  . metoprolol succinate (TOPROL-XL) 100 MG 24 hr tablet Take 1 tablet (100 mg total) by mouth daily. 30 tablet 9   No current facility-administered medications for this visit.    Allergies:    Allergies  Allergen Reactions  . Ace Inhibitors Other (See Comments)    Lip swelling/blisters     Social History:  The patient  reports that he quit smoking about 36 years ago. His smoking use included Cigarettes. He has never used smokeless tobacco. He reports that he drinks alcohol. He reports that he does not use illicit drugs.   Family history:   Family History  Problem Relation Age of Onset  . Throat cancer Father 60    smoker  . Prostate cancer Brother     surgery  . Colon cancer Neg Hx   . Diabetes Neg Hx   . Esophageal cancer Father   . Colon  polyps Neg Hx     ROS:  Please see the history of present illness.  All other systems reviewed and negative.   PHYSICAL EXAM: VS:  BP 190/110 mmHg  Pulse 59  Ht 5\' 9"  (1.753 m)  Wt 181 lb 9.6 oz (82.373 kg)  BMI 26.81 kg/m2 Well nourished, well developed, in no acute distress HEENT: Pupils are equal round react to light accommodation extraocular movements are intact.  Neck: no JVDNo cervical lymphadenopathy. Cardiac: Regular rate and rhythm without murmurs rubs or gallops. Lungs:  clear to auscultation bilaterally, no wheezing, rhonchi or rales Abd: soft, nontender, positive bowel sounds all quadrants, no hepatosplenomegaly Ext: no lower extremity edema.  2+ radial and dorsalis pedis pulses. Skin: warm and dry Neuro:  Grossly normal  EKG:  Left bundle branch block rate 59 bpm  ASSESSMENT AND PLAN:  Problem List Items Addressed This Visit    Palpitations   NSVT (nonsustained ventricular tachycardia) (HCC)   Left bundle branch block   Essential hypertension - Primary     The patient reports the palpitations have not been that bad in the last week.  He did have some NSVT(11 beats), PVCs and junctional rhythm on the monitor I ordered.  I already ordered an echo which is scheduled for next Wednesday.  He drinks about 8 beers per night which makes me concerned about a cardiomyopathy.  He is well compensated and there are no signs or symptoms of heart failure.  He does exercise regularly and eats a low sodium diet.  He said he can cut back on the ETOH.  His BP is high today.  I rechecked it and it was 184/90.  Given his allergy to ACE-I, I added Hydralazine 25 BID.  If he has a more prolonged run of palpitations then he gets a very uncomfortable feeling in his chest. Otherwise, he has no chest pain or tightness when he walks. We'll defer stress test at this time.

## 2015-09-10 ENCOUNTER — Other Ambulatory Visit: Payer: Self-pay

## 2015-09-10 ENCOUNTER — Ambulatory Visit (HOSPITAL_COMMUNITY): Payer: No Typology Code available for payment source | Attending: Cardiology

## 2015-09-10 DIAGNOSIS — I472 Ventricular tachycardia: Secondary | ICD-10-CM | POA: Diagnosis not present

## 2015-09-10 DIAGNOSIS — I4729 Other ventricular tachycardia: Secondary | ICD-10-CM

## 2015-09-10 DIAGNOSIS — I447 Left bundle-branch block, unspecified: Secondary | ICD-10-CM

## 2015-09-10 DIAGNOSIS — I10 Essential (primary) hypertension: Secondary | ICD-10-CM

## 2015-09-16 ENCOUNTER — Telehealth (HOSPITAL_COMMUNITY): Payer: Self-pay

## 2015-09-16 NOTE — Telephone Encounter (Signed)
Encounter complete. 

## 2015-09-18 ENCOUNTER — Ambulatory Visit (HOSPITAL_COMMUNITY)
Admission: RE | Admit: 2015-09-18 | Discharge: 2015-09-18 | Disposition: A | Discharge status: Home / Self Care | Payer: No Typology Code available for payment source | Source: Ambulatory Visit | Attending: Cardiovascular Disease | Admitting: Cardiovascular Disease

## 2015-09-18 DIAGNOSIS — I447 Left bundle-branch block, unspecified: Secondary | ICD-10-CM | POA: Insufficient documentation

## 2015-09-18 DIAGNOSIS — R079 Chest pain, unspecified: Secondary | ICD-10-CM | POA: Insufficient documentation

## 2015-09-18 DIAGNOSIS — Z87891 Personal history of nicotine dependence: Secondary | ICD-10-CM | POA: Insufficient documentation

## 2015-09-18 DIAGNOSIS — I10 Essential (primary) hypertension: Secondary | ICD-10-CM | POA: Insufficient documentation

## 2015-09-18 DIAGNOSIS — I472 Ventricular tachycardia: Secondary | ICD-10-CM | POA: Diagnosis not present

## 2015-09-18 DIAGNOSIS — I4729 Other ventricular tachycardia: Secondary | ICD-10-CM

## 2015-09-18 DIAGNOSIS — R5383 Other fatigue: Secondary | ICD-10-CM | POA: Diagnosis not present

## 2015-09-18 DIAGNOSIS — R0609 Other forms of dyspnea: Secondary | ICD-10-CM | POA: Diagnosis not present

## 2015-09-18 LAB — MYOCARDIAL PERFUSION IMAGING
CHL CUP NUCLEAR SDS: 2
CHL CUP NUCLEAR SRS: 3
CHL CUP NUCLEAR SSS: 5
CHL CUP RESTING HR STRESS: 64 {beats}/min
LV sys vol: 95 mL
LVDIAVOL: 170 mL
Peak HR: 81 {beats}/min
TID: 1.13

## 2015-09-18 MED ORDER — REGADENOSON 0.4 MG/5ML IV SOLN
0.4000 mg | Freq: Once | INTRAVENOUS | Status: AC
  Administered 2015-09-18: 0.4 mg via INTRAVENOUS

## 2015-09-18 MED ORDER — TECHNETIUM TC 99M SESTAMIBI GENERIC - CARDIOLITE
31.1000 | Freq: Once | INTRAVENOUS | Status: AC | PRN
  Administered 2015-09-18: 31.1 via INTRAVENOUS

## 2015-09-18 MED ORDER — TECHNETIUM TC 99M SESTAMIBI GENERIC - CARDIOLITE
10.4000 | Freq: Once | INTRAVENOUS | Status: AC | PRN
  Administered 2015-09-18: 10 via INTRAVENOUS

## 2015-09-21 DIAGNOSIS — I8290 Acute embolism and thrombosis of unspecified vein: Secondary | ICD-10-CM

## 2015-09-21 HISTORY — DX: Acute embolism and thrombosis of unspecified vein: I82.90

## 2015-09-23 ENCOUNTER — Telehealth: Payer: Self-pay | Admitting: *Deleted

## 2015-09-23 NOTE — Telephone Encounter (Signed)
Per Tarri Fuller, PA-C, pt and wife has been notified of his stress test results.  They verbalized understanding.

## 2015-11-17 ENCOUNTER — Ambulatory Visit: Payer: Self-pay | Admitting: Cardiology

## 2016-01-15 ENCOUNTER — Ambulatory Visit: Payer: Self-pay | Admitting: Cardiology

## 2016-02-20 ENCOUNTER — Encounter: Payer: Self-pay | Admitting: Physician Assistant

## 2016-02-20 ENCOUNTER — Telehealth: Payer: Self-pay | Admitting: Cardiology

## 2016-02-20 NOTE — Telephone Encounter (Signed)
Wife lori stopped by - her husband had stress test ?in December and was told there was slight abnormality.  He has follow up appt in August with Dr. [name].  Pt saw PCP yesterday who after looking at chart indicated to patient that he had CHF.  Per wife, pt "freaking out". States he wasn't told of CHF and wonders if he should wait until August and does he indeed have CHF.

## 2016-02-20 NOTE — Telephone Encounter (Signed)
Returned call. Clarified that cardiomyopathy doesn't necessarily equal CHF.  Wife notes pt has never felt better. No acute issues.  Advised August follow up as scheduled and to call sooner if any acute concerns.  Understanding verbalized, wife voiced thanks for the call.

## 2016-02-26 ENCOUNTER — Telehealth: Payer: Self-pay | Admitting: Cardiology

## 2016-02-26 NOTE — Telephone Encounter (Signed)
Follow-up     The pt wife is retuning Jennifer's W's phone call.

## 2016-02-27 ENCOUNTER — Telehealth: Payer: Self-pay | Admitting: Cardiology

## 2016-02-27 NOTE — Telephone Encounter (Signed)
Received records from [facility] for appointment on 04/23/16 with Dr [name].  Records were given to [facility] (medical records) for Dr [name] schedule on 04/23/16. lp

## 2016-02-27 NOTE — Telephone Encounter (Signed)
Returned pts call.  Left another message for pt to call back. 

## 2016-04-21 ENCOUNTER — Encounter: Payer: Self-pay | Admitting: Cardiology

## 2016-04-21 NOTE — Progress Notes (Signed)
Cardiology Office Note    Date:  04/23/2016   ID:  Dennis Lewis, DOB [birth year], MRN [id number]  PCP:  [name], MD  Cardiologist:  Peter [name], MD    History of Present Illness:  Dennis Lewis is a 60 y.o. male seen for follow up of palpitations and cardiomyopathy.  He had evaluation with normal cardiac cath in 1990. In 2010 he had a normal Myoview study. Echo at that time showed mild LVH with septal dyssynergy and mild LV dysfunction. He has a history of angioedema on ACEi. He also has a history of HTN, chronic LBBB, and GERD.  He was seen in June of 2016 with increased palpitations. He wore an event monitor which showed some PVCs, junctional rhythm, and an 11 beat run of NSVT. He was maintained on metoprolol. In December 2016 he had repeat Echo that showed EF of 35-40% with global hypokinesis. Mild LVH, Moderate LAE. Grade 2 diastolic dysfunction. Myoview study showed an EF 44%. There was a fixed defect in the basal to mid inferior wall felt to possibly be related to diaphragmatic attenuation. He was severely hypertensive at the time and hydralazine was added. He was drinking >8 beers/night and cessation was recommended.   On follow up today he notes he has felt the best in years. He thinks Acid reflux therapy is what was causing his palpitations and since he stopped this his palpitations have resolved. He is able to take Dexilant. He does walk daily. He has cut his beer consumption to 6 beers/night. He denies any chest pain, SOB, edema, or syncope. He was prescribed hydralazine but hasn't started it yet. Thinks he can bring his cholesterol down with dietary changes.     Past Medical History:  Diagnosis Date  . Anxiety   . Cardiomyopathy due to hypertension, without heart failure ([place])   . 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 GERD (gastroesophageal reflux disease)   . H/O cardiovascular stress test 2010   [name]  . H/O echocardiogram 2010    mild LVH  with some septal dyssynergy, EF 45%, impaired relaxation  . Hyperlipidemia   . Hypertension   . Left bundle branch block   . NSVT (nonsustained ventricular tachycardia) ([place])   . Palpitations   . Prostate cancer ([place]) 04/02/14   Gleason 7, volume 30 gm  . S/P radiation therapy 06/26/2014 through 08/22/2014                                                      Prostate 7800 cGy in 40 sessions                        . Torn tendon    right shoulder    Past Surgical History:  Procedure Laterality Date  . BACK SURGERY  1993  . CARDIAC CATHETERIZATION  1990   Normal cors  . PROSTATE BIOPSY  04/02/14   Gleason 7, vol 30 gm    Current Medications: Outpatient Medications Prior to Visit  Medication Sig Dispense Refill  . metoprolol succinate (TOPROL-XL) 100 MG 24 hr tablet Take 1 tablet (100 mg total) by mouth daily. 30 tablet 9  . hydrALAZINE (APRESOLINE) 25 MG tablet Take 1 tablet (25 mg total) by mouth 2 (two) times daily. 60 tablet 6  No facility-administered medications prior to visit.      Allergies:   Ace inhibitors   Social History   Social History  . Marital status: Single    Spouse name: N/A  . Number of children: 2  . Years of education: N/A   Occupational History  . Nurse, adult    Social History Main Topics  . Smoking status: Former Smoker    Types: Cigarettes    Quit date: 02/22/1979  . Smokeless tobacco: Never Used  . Alcohol use Yes     Comment: beer daily  . Drug use: No  . Sexual activity: Not Asked   Other Topics Concern  . None   Social History Narrative  . None     Family History:  The patient's family history includes Esophageal cancer in his father; Prostate cancer in his brother; Throat cancer (age of onset: 60) in his father.   ROS:   Please see the history of present illness.    ROS All other systems reviewed and are negative.   PHYSICAL EXAM:   VS:  BP (!) 185/93   Pulse 64   Ht 5\' 11"  (1.803 m)   Wt 185 lb 9.6 oz (84.2 kg)    BMI 25.89 kg/m    I repeated BP and it was 174/80. GEN: Well nourished, well developed, in no acute distress  HEENT: normal  Neck: no JVD, carotid bruits, or masses Cardiac: RRR; no murmurs, rubs, or gallops,no edema  Respiratory:  clear to auscultation bilaterally, normal work of breathing GI: soft, nontender, nondistended, + BS MS: no deformity or atrophy  Skin: warm and dry, no rash Neuro:  Alert and Oriented x 3, Strength and sensation are intact Psych: euthymic mood, full affect  Wt Readings from Last 3 Encounters:  04/23/16 185 lb 9.6 oz (84.2 kg)  09/18/15 181 lb (82.1 kg)  09/05/15 181 lb 9.6 oz (82.4 kg)      Studies/Labs Reviewed:   EKG:  EKG is not ordered today.  The ekg ordered today demonstrates N/A  Recent Labs: No results found for requested labs within last 8760 hours.   Lipid Panel No results found for: CHOL, TRIG, HDL, CHOLHDL, VLDL, LDLCALC, LDLDIRECT   Labs reviewed from primary care on 02/14/16: CMET and CBC normal. Cholesterol 224, triglycerides 128, LDL 141, HDL 57.   Additional studies/ records that were reviewed today include:  Myoview study 09/17/16: Study Highlights    Nuclear stress EF: 44%.  The left ventricular ejection fraction is moderately decreased (30-44%).  There was no ST segment deviation noted during stress.  Defect 1: There is a small defect of severe severity present in the basal inferior and mid inferior location. This is likely due to diaphragmatic attenuation, given that the wall motion is similar to the rest of the myocardium and the apical inferior wall is spared. Cannot rule out prior infarct.  This is a moderate risk study due to depressed LVEF. There is no ischemia.  This is an intermediate risk study.   Echo: 09/09/16: Study Conclusions  - Left ventricle: The cavity size was normal. Wall thickness was   increased in a pattern of mild LVH. Systolic function was   moderately reduced. The estimated ejection fraction  was in the   range of 35% to 40%. Diffuse hypokinesis. Features are consistent   with a pseudonormal left ventricular filling pattern, with   concomitant abnormal relaxation and increased filling pressure   (grade 2 diastolic dysfunction). Doppler parameters are  consistent with high ventricular filling pressure. - Aortic valve: There was trivial regurgitation. - Mitral valve: There was mild regurgitation. - Left atrium: The atrium was moderately dilated. - Pericardium, extracardiac: A trivial pericardial effusion was   identified.  Impressions:  - Moderate global reduction in LV function; grade 2 diastolic   dysfunction; trace AI; mild MR; moderate LAE.    ASSESSMENT:    1. Palpitations   2. Congestive dilated cardiomyopathy ([place])   3. Hypertensive heart disease without heart failure      PLAN:  In order of problems listed above:  1. Dilated cardiomyopathy. I suspect this is related to his heavy Etoh use and uncontrolled HTN with hypertensive heart disease. No overt CHF. Asymptomatic. Prior Myoview low risk and he has no angina. On metoprolol. Unable to take ACEi. Agree with hydralazine 25 mg tid. Stressed importance of Etoh cessation. Continue regular aerobic activity. I will follow up in 4 months. Plan to repeat Echo once BP control improved. 2.   HTN with hypertensive heart disease. As noted in #1 3.   Etoh dependence 4.   Hypercholesterolemia. I would recommend a statin but he wants to see if he can control with dietary modifications. Will follow up lab work with Dr. Brigitte Pulse.  5.   Palpitations. History of PVCs and short run NSVT. Now asymptomatic. Focus on BP control and elimination of Etoh.    Medication Adjustments/Labs and Tests Ordered: Current medicines are reviewed at length with the patient today.  Concerns regarding medicines are outlined above.  Medication changes, Labs and Tests ordered today are listed in the Patient Instructions below. Patient Instructions    Increase hydralazine to 25 mg three times a day  Monitor your blood pressure - we would like to see it under 135/80  You need to eliminate alcohol intake.  We will follow up in 4 months.     Signed, Peter [name], MD  04/23/2016 8:11 AM    [facility] [address] [place], [place], [address] [phone]

## 2016-04-23 ENCOUNTER — Encounter: Payer: Self-pay | Admitting: Cardiology

## 2016-04-23 ENCOUNTER — Ambulatory Visit (INDEPENDENT_AMBULATORY_CARE_PROVIDER_SITE_OTHER): Payer: Managed Care, Other (non HMO) | Admitting: Cardiology

## 2016-04-23 VITALS — BP 185/93 | HR 64 | Ht 71.0 in | Wt 185.6 lb

## 2016-04-23 DIAGNOSIS — I119 Hypertensive heart disease without heart failure: Secondary | ICD-10-CM | POA: Diagnosis not present

## 2016-04-23 DIAGNOSIS — R002 Palpitations: Secondary | ICD-10-CM | POA: Diagnosis not present

## 2016-04-23 DIAGNOSIS — I420 Dilated cardiomyopathy: Secondary | ICD-10-CM | POA: Insufficient documentation

## 2016-04-23 HISTORY — DX: Dilated cardiomyopathy: I42.0

## 2016-04-23 MED ORDER — HYDRALAZINE HCL 25 MG PO TABS: 25.0000 mg | ORAL_TABLET | Freq: Three times a day (TID) | ORAL | 3 refills | Status: DC

## 2016-04-23 NOTE — Patient Instructions (Signed)
Increase hydralazine to 25 mg three times a day  Monitor your blood pressure - we would like to see it under 135/80  You need to eliminate alcohol intake.  We will follow up in 4 months.

## 2016-06-06 ENCOUNTER — Encounter (HOSPITAL_COMMUNITY)
Admission: EM | Disposition: A | Discharge status: Home / Self Care | Payer: Self-pay | Source: Home / Self Care | Attending: Interventional Cardiology

## 2016-06-06 ENCOUNTER — Encounter (HOSPITAL_COMMUNITY): Payer: Self-pay | Admitting: Emergency Medicine

## 2016-06-06 ENCOUNTER — Emergency Department (HOSPITAL_COMMUNITY): Payer: Managed Care, Other (non HMO)

## 2016-06-06 ENCOUNTER — Inpatient Hospital Stay (HOSPITAL_COMMUNITY)
Admission: EM | Admit: 2016-06-06 | Discharge: 2016-06-08 | DRG: 281 | Disposition: A | Discharge status: Home / Self Care | Payer: Managed Care, Other (non HMO) | Attending: Interventional Cardiology | Admitting: Interventional Cardiology

## 2016-06-06 DIAGNOSIS — I110 Hypertensive heart disease with heart failure: Secondary | ICD-10-CM | POA: Diagnosis present

## 2016-06-06 DIAGNOSIS — I214 Non-ST elevation (NSTEMI) myocardial infarction: Secondary | ICD-10-CM

## 2016-06-06 DIAGNOSIS — E785 Hyperlipidemia, unspecified: Secondary | ICD-10-CM | POA: Diagnosis present

## 2016-06-06 DIAGNOSIS — I447 Left bundle-branch block, unspecified: Secondary | ICD-10-CM | POA: Diagnosis present

## 2016-06-06 DIAGNOSIS — I5040 Unspecified combined systolic (congestive) and diastolic (congestive) heart failure: Secondary | ICD-10-CM | POA: Diagnosis present

## 2016-06-06 DIAGNOSIS — Z923 Personal history of irradiation: Secondary | ICD-10-CM

## 2016-06-06 DIAGNOSIS — I5181 Takotsubo syndrome: Secondary | ICD-10-CM | POA: Diagnosis present

## 2016-06-06 DIAGNOSIS — I2510 Atherosclerotic heart disease of native coronary artery without angina pectoris: Secondary | ICD-10-CM

## 2016-06-06 DIAGNOSIS — I429 Cardiomyopathy, unspecified: Secondary | ICD-10-CM | POA: Diagnosis not present

## 2016-06-06 DIAGNOSIS — R079 Chest pain, unspecified: Secondary | ICD-10-CM | POA: Diagnosis not present

## 2016-06-06 DIAGNOSIS — R7989 Other specified abnormal findings of blood chemistry: Secondary | ICD-10-CM

## 2016-06-06 DIAGNOSIS — F1010 Alcohol abuse, uncomplicated: Secondary | ICD-10-CM | POA: Diagnosis present

## 2016-06-06 DIAGNOSIS — I480 Paroxysmal atrial fibrillation: Secondary | ICD-10-CM | POA: Diagnosis not present

## 2016-06-06 DIAGNOSIS — I428 Other cardiomyopathies: Secondary | ICD-10-CM | POA: Diagnosis present

## 2016-06-06 DIAGNOSIS — Z79899 Other long term (current) drug therapy: Secondary | ICD-10-CM

## 2016-06-06 DIAGNOSIS — R778 Other specified abnormalities of plasma proteins: Secondary | ICD-10-CM

## 2016-06-06 DIAGNOSIS — Z8546 Personal history of malignant neoplasm of prostate: Secondary | ICD-10-CM

## 2016-06-06 DIAGNOSIS — Z87891 Personal history of nicotine dependence: Secondary | ICD-10-CM

## 2016-06-06 HISTORY — PX: CARDIAC CATHETERIZATION: SHX172

## 2016-06-06 LAB — BASIC METABOLIC PANEL
ANION GAP: 10 (ref 5–15)
BUN: 11 mg/dL (ref 6–20)
CALCIUM: 9.7 mg/dL (ref 8.9–10.3)
CHLORIDE: 104 mmol/L (ref 101–111)
CO2: 24 mmol/L (ref 22–32)
Creatinine, Ser: 0.9 mg/dL (ref 0.61–1.24)
GFR calc Af Amer: >60 mL/min (ref >60)
GLUCOSE: 101 mg/dL — AB (ref 65–99)
Potassium: 3.8 mmol/L (ref 3.5–5.1)
Sodium: 138 mmol/L (ref 135–145)

## 2016-06-06 LAB — CBC
HEMATOCRIT: 43.4 % (ref 39.0–52.0)
HEMOGLOBIN: 14.8 g/dL (ref 13.0–17.0)
MCH: 32.7 pg (ref 26.0–34.0)
MCHC: 34.1 g/dL (ref 30.0–36.0)
MCV: 95.8 fL (ref 78.0–100.0)
Platelets: 233 10*3/uL (ref 150–400)
RBC: 4.53 MIL/uL (ref 4.22–5.81)
RDW: 13.1 % (ref 11.5–15.5)
WBC: 8.1 10*3/uL (ref 4.0–10.5)

## 2016-06-06 LAB — PROTIME-INR
INR: 0.93
PROTHROMBIN TIME: 12.4 s (ref 11.4–15.2)

## 2016-06-06 LAB — I-STAT TROPONIN, ED: TROPONIN I, POC: 11.63 ng/mL — AB (ref 0.00–0.08)

## 2016-06-06 LAB — MAGNESIUM: MAGNESIUM: 2 mg/dL (ref 1.7–2.4)

## 2016-06-06 SURGERY — LEFT HEART CATH AND CORONARY ANGIOGRAPHY
Anesthesia: LOCAL

## 2016-06-06 MED ORDER — HEPARIN (PORCINE) IN NACL 100-0.45 UNIT/ML-% IJ SOLN
1200.0000 [IU]/h | INTRAMUSCULAR | Status: DC
  Administered 2016-06-06: 1200 [IU]/h via INTRAVENOUS
  Filled 2016-06-06 (×2): qty 250

## 2016-06-06 MED ORDER — ASPIRIN 81 MG PO CHEW
CHEWABLE_TABLET | ORAL | Status: AC
  Filled 2016-06-06: qty 4

## 2016-06-06 MED ORDER — IOPAMIDOL (ISOVUE-370) INJECTION 76%
INTRAVENOUS | Status: AC
  Filled 2016-06-06: qty 125

## 2016-06-06 MED ORDER — LIDOCAINE HCL (PF) 1 % IJ SOLN
INTRAMUSCULAR | Status: AC
  Filled 2016-06-06: qty 30

## 2016-06-06 MED ORDER — HEPARIN BOLUS VIA INFUSION
4000.0000 [IU] | Freq: Once | INTRAVENOUS | Status: AC
  Administered 2016-06-06: 4000 [IU] via INTRAVENOUS
  Filled 2016-06-06: qty 4000

## 2016-06-06 MED ORDER — FENTANYL CITRATE (PF) 100 MCG/2ML IJ SOLN
INTRAMUSCULAR | Status: DC | PRN
  Administered 2016-06-06: 50 ug via INTRAVENOUS
  Administered 2016-06-06: 25 ug via INTRAVENOUS

## 2016-06-06 MED ORDER — FENTANYL CITRATE (PF) 100 MCG/2ML IJ SOLN
INTRAMUSCULAR | Status: AC
  Filled 2016-06-06: qty 2

## 2016-06-06 MED ORDER — HEPARIN (PORCINE) IN NACL 2-0.9 UNIT/ML-% IJ SOLN
INTRAMUSCULAR | Status: AC
  Filled 2016-06-06: qty 500

## 2016-06-06 MED ORDER — MIDAZOLAM HCL 2 MG/2ML IJ SOLN
INTRAMUSCULAR | Status: AC
  Filled 2016-06-06: qty 2

## 2016-06-06 MED ORDER — VERAPAMIL HCL 2.5 MG/ML IV SOLN
INTRAVENOUS | Status: AC
  Filled 2016-06-06: qty 2

## 2016-06-06 MED ORDER — MIDAZOLAM HCL 2 MG/2ML IJ SOLN
INTRAMUSCULAR | Status: DC | PRN
  Administered 2016-06-06: 1 mg via INTRAVENOUS
  Administered 2016-06-06: 2 mg via INTRAVENOUS

## 2016-06-06 MED ORDER — ASPIRIN 81 MG PO CHEW
CHEWABLE_TABLET | ORAL | Status: DC | PRN
  Administered 2016-06-06: 324 mg via ORAL

## 2016-06-06 MED ORDER — NITROGLYCERIN 1 MG/10 ML FOR IR/CATH LAB
INTRA_ARTERIAL | Status: AC
  Filled 2016-06-06: qty 10

## 2016-06-06 MED ORDER — NITROGLYCERIN IN D5W 200-5 MCG/ML-% IV SOLN
0.0000 ug/min | INTRAVENOUS | Status: DC
  Administered 2016-06-06: 5 ug/min via INTRAVENOUS
  Administered 2016-06-07: 25 ug/min via INTRAVENOUS
  Filled 2016-06-06: qty 250

## 2016-06-06 MED ORDER — NITROGLYCERIN 0.4 MG SL SUBL
0.4000 mg | SUBLINGUAL_TABLET | SUBLINGUAL | Status: DC | PRN
  Filled 2016-06-06: qty 1

## 2016-06-06 MED ORDER — HEPARIN (PORCINE) IN NACL 2-0.9 UNIT/ML-% IJ SOLN
INTRAMUSCULAR | Status: DC | PRN
  Administered 2016-06-06: 1500 mL

## 2016-06-06 MED ORDER — MORPHINE SULFATE (PF) 4 MG/ML IV SOLN
4.0000 mg | INTRAVENOUS | Status: DC | PRN
Start: 2016-06-06 — End: 2016-06-08
  Administered 2016-06-06 – 2016-06-07 (×2): 4 mg via INTRAVENOUS
  Filled 2016-06-06 (×2): qty 1

## 2016-06-06 MED ORDER — HEPARIN (PORCINE) IN NACL 2-0.9 UNIT/ML-% IJ SOLN
INTRAMUSCULAR | Status: AC
  Filled 2016-06-06: qty 1000

## 2016-06-06 MED ORDER — ASPIRIN 81 MG PO CHEW
324.0000 mg | CHEWABLE_TABLET | Freq: Once | ORAL | Status: DC
  Filled 2016-06-06: qty 4

## 2016-06-06 SURGICAL SUPPLY — 13 items
CATH INFINITI 5 FR JL3.5 (CATHETERS) ×2 IMPLANT
CATH INFINITI 5FR ANG PIGTAIL (CATHETERS) ×2 IMPLANT
CATH INFINITI JR4 5F (CATHETERS) ×2 IMPLANT
DEVICE RAD COMP TR BAND LRG (VASCULAR PRODUCTS) ×2 IMPLANT
GLIDESHEATH SLEND SS 6F .021 (SHEATH) ×2 IMPLANT
KIT ENCORE 26 ADVANTAGE (KITS) ×2 IMPLANT
KIT HEART LEFT (KITS) ×2 IMPLANT
PACK CARDIAC CATHETERIZATION (CUSTOM PROCEDURE TRAY) ×2 IMPLANT
SYR MEDRAD MARK V 150ML (SYRINGE) ×2 IMPLANT
TRANSDUCER W/STOPCOCK (MISCELLANEOUS) ×2 IMPLANT
TUBING CIL FLEX 10 FLL-RA (TUBING) ×2 IMPLANT
WIRE HI TORQ VERSACORE-J 145CM (WIRE) ×2 IMPLANT
WIRE SAFE-T 1.5MM-J .035X260CM (WIRE) ×2 IMPLANT

## 2016-06-06 NOTE — ED Provider Notes (Signed)
I saw and evaluated the patient, reviewed the resident's note and I agree with the findings and plan.  Pertinent History: The patient is a 60 year old male, he has no known obstructive cardiac disease, he does have a history of palpitations and has had a negative stress test as recently as 6 months ago. He presents to the hospital after developing chest pain early this morning, it has continued throughout the day for at least 12 hours. 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 He denies smoking cigarettes, he is treated for hypertension, he does take Toprol, he does not smoke cigarettes.   Pertinent Exam findings: The patient has clear heart and lung sounds, there is no tachycardia-bradycardia or murmurs, there is no peripheral edema, no JVD, the patient does appear well, he does not appear distressed or anxious.  The patient has an EKG which shows a left bundle branch block, this has been prior evaluation through multiple testing including a stress test, he follows with Dr. [name], his troponin was measured at 11, I have asked for consultation with the cardiologist regarding his ongoing symptoms with an elevated troponin and a left bundle branch block which may be masking an underlying STEMI. The patient is critically ill, heparin started  CRITICAL CARE Performed by: Johnna Acosta Total critical care time: 35 minutes Critical care time was exclusive of separately billable procedures and treating other patients. Critical care was necessary to treat or prevent imminent or life-threatening deterioration. Critical care was time spent personally by me on the following activities: development of treatment plan with patient and/or surrogate as well as nursing, discussions with consultants, evaluation of patient's response to treatment, examination of patient, obtaining history from  patient or surrogate, ordering and performing treatments and interventions, ordering and review of laboratory studies, ordering and review of radiographic studies, pulse oximetry and re-evaluation of patient's condition.   I personally interpreted the EKG as well as the resident and agree with the interpretation on the resident's chart.  Final diagnoses:  Chest pain      Noemi Chapel, MD 06/09/16 [phone]

## 2016-06-06 NOTE — ED Provider Notes (Signed)
[facility] DEPT Provider Note   CSN: [id number] Arrival date & time: 06/06/16  1942    History   Chief Complaint Chief Complaint  Patient presents with  . Chest Pain    HPI Dennis Lewis is a 60 y.o. male.  The history is provided by the patient, the spouse and medical records.   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 Onset was today, around 10 AM. Located in substernal area. Nonradiating. Described as indigestion and pressure. Severity 7 out of 10. Nonradiating. Has been constant all day. No clear alleviating or exacerbating factors. Tried Tums as he thought this was related to indigestion but this did not help. Has some associated nausea. No associated diaphoresis, shortness of breath, palpitations, leg swelling, fevers, cough. Patient states he had a stress test that was negative within the last year (per chart review- in December 2016- intermediate study). He had a cath in 1990 that was negative, and has no stents. Denies history of MI.    Past Medical History:  Diagnosis Date  . Anxiety   . Cardiomyopathy due to hypertension, without heart failure ([place])   . Congestive dilated cardiomyopathy ([place]) 04/23/2016  . GERD (gastroesophageal reflux disease)   . H/O cardiovascular stress test 2010   [name]  . H/O echocardiogram 2010    mild LVH with some septal dyssynergy, EF 45%, impaired relaxation  . Hyperlipidemia   . Hypertension   . Left bundle branch block   . NSVT (nonsustained ventricular tachycardia) ([place])   . Palpitations   . Prostate cancer ([place]) 04/02/14   Gleason 7, volume 30 gm  . S/P radiation therapy 06/26/2014 through 08/22/2014                                                      Prostate 7800 cGy in 40 sessions                        . Torn tendon    right shoulder      Patient Active Problem List   Diagnosis Date Noted  . Congestive dilated cardiomyopathy ([place]) 04/23/2016  . Hypertensive heart disease 04/23/2016  . NSVT (nonsustained ventricular tachycardia) ([place]) 08/21/2015  . Malignant neoplasm of prostate ([place]) 05/14/2014  . Palpitations 07/19/2013  . Left bundle branch block 07/19/2013  . Essential hypertension 07/19/2013    Past Surgical History:  Procedure Laterality Date  . BACK SURGERY  1993  . CARDIAC CATHETERIZATION  1990   Normal cors  . PROSTATE BIOPSY  04/02/14   Gleason 7, vol 30 gm       Home Medications    Prior to Admission medications   Medication Sig Start Date End Date Taking? Authorizing Provider  dexlansoprazole (DEXILANT) 60 MG capsule Take 60 mg by mouth daily.   Yes Historical Provider, MD  metoprolol succinate (TOPROL-XL) 100 MG 24 hr tablet Take 1 tablet (100 mg total) by mouth daily. 08/06/15  Yes Brett Canales, PA-C  valACYclovir (VALTREX) 1000 MG tablet Take 1 g by mouth daily as needed (for outbreaks).  05/29/16  Yes Historical Provider, MD  hydrALAZINE (APRESOLINE) 25 MG tablet Take 1 tablet (25 mg total) by mouth  3 (three) times daily. Patient not taking: Reported on 06/06/2016 04/23/16 07/22/16  [name] [name], MD    Family History Family History  Problem Relation Age of Onset  . Throat cancer Father 68    smoker  . Esophageal cancer Father   . Prostate cancer Brother     surgery  . Colon cancer Neg Hx   . Diabetes Neg Hx   . Colon polyps Neg Hx     Social History Social History  Substance Use Topics  . Smoking status: Former Smoker    Types: Cigarettes    Quit date: 02/22/1979  . Smokeless tobacco: Never Used  . Alcohol use Yes     Comment: beer daily     Allergies   Ace inhibitors; Chocolate; Other; Peanut-containing drug products; and Hydralazine   Review of Systems Review of Systems  Constitutional: Negative for diaphoresis and fever.  Respiratory: Negative for cough and shortness  of breath.   Cardiovascular: Positive for chest pain. Negative for palpitations and leg swelling.  Gastrointestinal: Positive for nausea. Negative for abdominal pain and vomiting.  Genitourinary: Negative for decreased urine volume.  Skin: Negative for rash.  Neurological: Negative for syncope.  Hematological: Does not bruise/bleed easily.  Psychiatric/Behavioral: The patient is nervous/anxious.   All other systems reviewed and are negative.   Physical Exam Updated Vital Signs BP 154/93   Pulse 70   Temp 99 F (37.2 C) (Oral)   Resp 16   SpO2 95%   Physical Exam  Constitutional: He appears well-developed and well-nourished.  HENT:  Head: Normocephalic and atraumatic.  Eyes: Conjunctivae are normal.  Neck: Neck supple. No JVD present.  Cardiovascular: Normal rate, regular rhythm and intact distal pulses.   No murmur heard. Pulmonary/Chest: Effort normal and breath sounds normal. No respiratory distress. He exhibits no tenderness.  Abdominal: Soft. There is no tenderness.  Musculoskeletal: He exhibits no edema.  Neurological: He is alert.  Skin: Skin is warm and dry. He is not diaphoretic.  Psychiatric: He has a normal mood and affect.  Nursing note and vitals reviewed.   ED Treatments / Results  Labs (all labs ordered are listed, but only abnormal results are displayed) Labs Reviewed  BASIC METABOLIC PANEL - Abnormal; Notable for the following:       Result Value   Glucose, Bld 101 (*)    All other components within normal limits  I-STAT TROPOININ, ED - Abnormal; Notable for the following:    Troponin i, poc 11.63 (*)    All other components within normal limits  CBC  MAGNESIUM  HEPARIN LEVEL (UNFRACTIONATED)  CBC  PROTIME-INR    EKG  EKG Interpretation  Date/Time:  Sunday June 06 2016 20:46:28 EDT Ventricular Rate:  76 PR Interval:    QRS Duration: 150 QT Interval:  444 QTC Calculation: 500 R Axis:   105 Text Interpretation:  Sinus rhythm Atrial  premature complex Nonspecific intraventricular conduction delay Repol abnrm, prob ischemia, anterolateral lds Baseline wander in lead(s) V3 since last tracing no significant change Confirmed by Sabra Heck  MD, BRIAN ([id number]) on 06/06/2016 8:56:33 PM       Radiology Dg Chest Port 1 View  Result Date: 06/06/2016 CLINICAL DATA:  Epigastric pain starting around 9 a.m. today. Nonsmoker. EXAM: PORTABLE CHEST 1 VIEW COMPARISON:  None. FINDINGS: The heart size and mediastinal contours are within normal limits. Both lungs are clear. The visualized skeletal structures are unremarkable. IMPRESSION: No active disease. Electronically Signed   By: Oren Beckmann.D.  On: 06/06/2016 21:58    Procedures Procedures (including critical care time)  Medications Ordered in ED Medications  aspirin chewable tablet 324 mg (0 mg Oral Hold 06/06/16 2151)  nitroGLYCERIN (NITROSTAT) SL tablet 0.4 mg (not administered)  morphine 4 MG/ML injection 4 mg (4 mg Intravenous Given 06/06/16 2145)  heparin ADULT infusion 100 units/mL (25000 units/242mL sodium chloride 0.45%) (1,200 Units/hr Intravenous New Bag/Given 06/06/16 2159)  nitroGLYCERIN 50 mg in dextrose 5 % 250 mL (0.2 mg/mL) infusion (83.333 mcg/min Intravenous Rate/Dose Change 06/06/16 2221)  heparin bolus via infusion 4,000 Units (4,000 Units Intravenous Bolus from Bag 06/06/16 2200)     Initial Impression / Assessment and Plan / ED Course  I have reviewed the triage vital signs and the nursing notes.  Pertinent labs & imaging results that were available during my care of the patient were reviewed by me and considered in my medical decision making (see chart for details).  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  Upon arrival he is afebrile,  hypertensive, with normal heart rate. Exam unremarkable, with no evidence of volume overload or new murmur. EKG with left bundle branch block that is chronic. Troponin elevated 11 c/w ACS. Given LBBB obscuring possible STEMI discussed case with STEMI attending on call shortly after recognition of elevated troponin with concerning history. Full medical therapy initiated. Patient given ASA and started on heparin drip, given morphine and started on nitroglycerin gtt for pain as well as BP control. BP improving. Pain still present, but improved some. Cath lab activated- will be taken from ED for [facility] for further evaluation.   Case discussed with Dr. Sabra Heck who oversaw management of this patient.   Final Clinical Impressions(s) / ED Diagnoses   Final diagnoses:  NSTEMI (non-ST elevated myocardial infarction) [facility])    New Prescriptions New Prescriptions   No medications on file     Ivin Booty, MD 06/06/16 [place], MD 06/09/16 [phone]

## 2016-06-06 NOTE — Progress Notes (Signed)
ANTICOAGULATION CONSULT NOTE - Initial Consult  Pharmacy Consult for heparin Indication: chest pain/ACS  Allergies  Allergen Reactions  . Ace Inhibitors Other (See Comments)    Lip swelling/blisters     Patient Measurements:   Heparin Dosing Weight: 85 kg  Vital Signs: Temp: 99 F (37.2 C) (09/17 1951) Temp Source: Oral (09/17 1951) BP: 193/107 (09/17 2100) Pulse Rate: 77 (09/17 2100)  Labs:  Recent Labs  06/06/16 1957  HGB 14.8  HCT 43.4  PLT 233  CREATININE 0.90    CrCl cannot be calculated (Unknown ideal weight.).   Medical History: Past Medical History:  Diagnosis Date  . Anxiety   . Cardiomyopathy due to hypertension, without heart failure ([place])   . Congestive dilated cardiomyopathy ([place]) 04/23/2016  . GERD (gastroesophageal reflux disease)   . H/O cardiovascular stress test 2010   [name]  . H/O echocardiogram 2010    mild LVH with some septal dyssynergy, EF 45%, impaired relaxation  . Hyperlipidemia   . Hypertension   . Left bundle branch block   . NSVT (nonsustained ventricular tachycardia) ([place])   . Palpitations   . Prostate cancer ([place]) 04/02/14   Gleason 7, volume 30 gm  . S/P radiation therapy 06/26/2014 through 08/22/2014                                                      Prostate 7800 cGy in 40 sessions                        . Torn tendon    right shoulder    Medications:  Scheduled:  . aspirin  324 mg Oral Once    Assessment: 60 yo male admitted with chest pain, elevated troponin.  Pharmacy asked to begin anticoagulation with IV heparin.   Goal of Therapy:  Heparin level 0.3-0.7 units/ml Monitor platelets by anticoagulation protocol: Yes   Plan:  1. Start IV heparin with bolus of 4000 units x1. 2. Then start heparin gtt at 1200 units/hr. 3. Check heparin level 6 hrs after gtt starts. 4. Daily heparin level and CBC.  Uvaldo Rising, BCPS  Clinical Pharmacist Pager [phone]  06/06/2016 9:25 PM

## 2016-06-06 NOTE — ED Notes (Signed)
Mini lab reports Trop 11.63 Dr. Sabra Heck made aware, orders to bring to room.

## 2016-06-06 NOTE — ED Notes (Signed)
Pt signed consent for surgery. belonginings w/ family, family bedside.

## 2016-06-06 NOTE — ED Triage Notes (Signed)
Reports epigastric pain that started around 9am this morning.  Shortly afterwards pain moved up and across chest.  Denies sob, nausea, and vomiting.

## 2016-06-06 NOTE — ED Notes (Signed)
Pt is asking for valtrex.

## 2016-06-06 NOTE — H&P (Signed)
Dennis Lewis is an 60 y.o. male.   Primary Cardiologist: Dr. [name] PMD:  [name] MD Chief Complaint: chest pain  HPI: 60 y/o with a NICM, 35-40% EF at last check.  He had CP starting at about 10 AM.  It waxed and waned throughout the day but never went away fully.  Because the pain never went away, he came to the emergency room. ECG showed left bundle branch block. Lab work showed a significantly elevated troponin. He was started on IV nitroglycerin. He was given IV morphine. His pain decreased but did not fully go away. Because of the persistent chest discomfort, he is being rocked for emergent cardiac catheterization.   He had a cardiac cath from the femoral approach back in the 1990s.  No bleeding problems. No elective surgery coming up.  Past Medical History:  Diagnosis Date  . Anxiety   . Cardiomyopathy due to hypertension, without heart failure ([place])   . Congestive dilated cardiomyopathy ([place]) 04/23/2016  . GERD (gastroesophageal reflux disease)   . H/O cardiovascular stress test 2010   [name]  . H/O echocardiogram 2010    mild LVH with some septal dyssynergy, EF 45%, impaired relaxation  . Hyperlipidemia   . Hypertension   . Left bundle branch block   . NSVT (nonsustained ventricular tachycardia) ([place])   . Palpitations   . Prostate cancer ([place]) 04/02/14   Gleason 7, volume 30 gm  . S/P radiation therapy 06/26/2014 through 08/22/2014                                                      Prostate 7800 cGy in 40 sessions                        . Torn tendon    right shoulder    Past Surgical History:  Procedure Laterality Date  . BACK SURGERY  1993  . CARDIAC CATHETERIZATION  1990   Normal cors  . PROSTATE BIOPSY  04/02/14   Gleason 7, vol 30 gm    Family History  Problem Relation Age of Onset  . Throat cancer Father 39    smoker  . Esophageal cancer Father   . Prostate cancer Brother     surgery  . Colon cancer Neg Hx   . Diabetes Neg Hx   . Colon  polyps Neg Hx    Social History:  reports that he quit smoking about 37 years ago. His smoking use included Cigarettes. He has never used smokeless tobacco. He reports that he drinks alcohol. He reports that he does not use drugs.  Allergies:  Allergies  Allergen Reactions  . Ace Inhibitors Other (See Comments)    Lip swelling/blisters   . Chocolate Other (See Comments)    Blisters   . Other Other (See Comments)    Seed on bread causes lip blisters Anesthesia also, but does nor remember which one  . Peanut-Containing Drug Products Other (See Comments)    Blisters  . Hydralazine Palpitations    Irregular heartbeat, fluttering     (Not in a hospital admission)  Results for orders placed or performed during the hospital encounter of 06/06/16 (from the past 48 hour(s))  Basic metabolic panel     Status: Abnormal   Collection Time: 06/06/16  7:57  PM  Result Value Ref Range   Sodium 138 135 - 145 mmol/L   Potassium 3.8 3.5 - 5.1 mmol/L   Chloride 104 101 - 111 mmol/L   CO2 24 22 - 32 mmol/L   Glucose, Bld 101 (H) 65 - 99 mg/dL   BUN 11 6 - 20 mg/dL   Creatinine, Ser 0.90 0.61 - 1.24 mg/dL   Calcium 9.7 8.9 - 10.3 mg/dL   GFR calc non Af Amer >60 >60 mL/min   GFR calc Af Amer >60 >60 mL/min    Comment: (NOTE) The eGFR has been calculated using the CKD EPI equation. This calculation has not been validated in all clinical situations. eGFR's persistently <60 mL/min signify possible Chronic Kidney Disease.    Anion gap 10 5 - 15  CBC     Status: None   Collection Time: 06/06/16  7:57 PM  Result Value Ref Range   WBC 8.1 4.0 - 10.5 K/uL   RBC 4.53 4.22 - 5.81 MIL/uL   Hemoglobin 14.8 13.0 - 17.0 g/dL   HCT 43.4 39.0 - 52.0 %   MCV 95.8 78.0 - 100.0 fL   MCH 32.7 26.0 - 34.0 pg   MCHC 34.1 30.0 - 36.0 g/dL   RDW 13.1 11.5 - 15.5 %   Platelets 233 150 - 400 K/uL  I-stat troponin, ED     Status: Abnormal   Collection Time: 06/06/16  8:15 PM  Result Value Ref Range    Troponin i, poc 11.63 (HH) 0.00 - 0.08 ng/mL   Comment NOTIFIED PHYSICIAN    Comment 3            Comment: Due to the release kinetics of cTnI, a negative result within the first hours of the onset of symptoms does not rule out myocardial infarction with certainty. If myocardial infarction is still suspected, repeat the test at appropriate intervals.    Dg Chest Port 1 View  Result Date: 06/06/2016 CLINICAL DATA:  Epigastric pain starting around 9 a.m. today. Nonsmoker. EXAM: PORTABLE CHEST 1 VIEW COMPARISON:  None. FINDINGS: The heart size and mediastinal contours are within normal limits. Both lungs are clear. The visualized skeletal structures are unremarkable. IMPRESSION: No active disease. Electronically Signed   By: Lucienne Capers M.D.   On: 06/06/2016 21:58    ROS: persistent CP, no bleeding problems  OBJECTIVE:   Vitals:   Vitals:   06/06/16 2130 06/06/16 2145 06/06/16 2200 06/06/16 2215  BP: 174/94 178/100 (!) 173/101 154/93  Pulse: 70 67 69 70  Resp: _0 Temp:      TempSrc:      SpO2: 97% 98% 98% 95%   I&O's:  No intake or output data in the 24 hours ending 06/06/16 2306 TELEMETRY: Reviewed telemetry pt in NSR:     PHYSICAL EXAM General: Well developed, well nourished, in no acute distress Head:   Normal cephalic and atramatic  Lungs:   Clear bilaterally to auscultation. Heart:   HRRR S1 S2  No JVD.   Abdomen: abdomen soft and non-tender Msk:  Back normal,  Normal strength and tone for age. Extremities:   No edema.   Neuro: Alert and oriented. Psych:  Normal affect, responds appropriately  LABS: Basic Metabolic Panel:  Recent Labs  06/06/16 1957  NA 138  K 3.8  CL 104  CO2 24  GLUCOSE 101*  BUN 11  CREATININE 0.90  CALCIUM 9.7   Liver Function Tests: No results for input(s): AST, ALT,  ALKPHOS, BILITOT, PROT, ALBUMIN in the last 72 hours. No results for input(s): LIPASE, AMYLASE in the last 72 hours. CBC:  Recent Labs   06/06/16 1957  WBC 8.1  HGB 14.8  HCT 43.4  MCV 95.8  PLT 233   Cardiac Enzymes: No results for input(s): CKTOTAL, CKMB, CKMBINDEX, TROPONINI in the last 72 hours. BNP: Invalid input(s): POCBNP D-Dimer: No results for input(s): DDIMER in the last 72 hours. Hemoglobin A1C: No results for input(s): HGBA1C in the last 72 hours. Fasting Lipid Panel: No results for input(s): CHOL, HDL, LDLCALC, TRIG, CHOLHDL, LDLDIRECT in the last 72 hours. Thyroid Function Tests: No results for input(s): TSH, T4TOTAL, T3FREE, THYROIDAB in the last 72 hours.  Invalid input(s): FREET3 Anemia Panel: No results for input(s): VITAMINB12, FOLATE, FERRITIN, TIBC, IRON, RETICCTPCT in the last 72 hours. Coag Panel:   No results found for: INR, PROTIME     Assessment/Plan NSTEMI. NSR w/ LBBB by ECG.  Due to persistent chest discomfort not relieved with IV nitroglycerin and morphine, will plan for emergent cardiac catheterization and possible PCI. He should be a good candidate for drug-eluting stent if required.  Continue aggressive medical therapy for his nonischemic cardiomyopathy, thought to be due to alcohol abuse.  Larae Grooms 06/06/2016, 11:06 PM

## 2016-06-07 ENCOUNTER — Encounter (HOSPITAL_COMMUNITY): Payer: Self-pay | Admitting: Interventional Cardiology

## 2016-06-07 ENCOUNTER — Inpatient Hospital Stay (HOSPITAL_COMMUNITY): Payer: Managed Care, Other (non HMO)

## 2016-06-07 DIAGNOSIS — I5181 Takotsubo syndrome: Secondary | ICD-10-CM | POA: Diagnosis not present

## 2016-06-07 DIAGNOSIS — Z87891 Personal history of nicotine dependence: Secondary | ICD-10-CM | POA: Diagnosis not present

## 2016-06-07 DIAGNOSIS — I110 Hypertensive heart disease with heart failure: Secondary | ICD-10-CM | POA: Diagnosis present

## 2016-06-07 DIAGNOSIS — R079 Chest pain, unspecified: Secondary | ICD-10-CM | POA: Diagnosis present

## 2016-06-07 DIAGNOSIS — I5043 Acute on chronic combined systolic (congestive) and diastolic (congestive) heart failure: Secondary | ICD-10-CM

## 2016-06-07 DIAGNOSIS — Z923 Personal history of irradiation: Secondary | ICD-10-CM | POA: Diagnosis not present

## 2016-06-07 DIAGNOSIS — I447 Left bundle-branch block, unspecified: Secondary | ICD-10-CM | POA: Diagnosis present

## 2016-06-07 DIAGNOSIS — I428 Other cardiomyopathies: Secondary | ICD-10-CM | POA: Diagnosis present

## 2016-06-07 DIAGNOSIS — I2510 Atherosclerotic heart disease of native coronary artery without angina pectoris: Secondary | ICD-10-CM | POA: Diagnosis present

## 2016-06-07 DIAGNOSIS — R7989 Other specified abnormal findings of blood chemistry: Secondary | ICD-10-CM | POA: Diagnosis not present

## 2016-06-07 DIAGNOSIS — I5040 Unspecified combined systolic (congestive) and diastolic (congestive) heart failure: Secondary | ICD-10-CM | POA: Diagnosis present

## 2016-06-07 DIAGNOSIS — F1010 Alcohol abuse, uncomplicated: Secondary | ICD-10-CM | POA: Diagnosis present

## 2016-06-07 DIAGNOSIS — I25110 Atherosclerotic heart disease of native coronary artery with unstable angina pectoris: Secondary | ICD-10-CM | POA: Diagnosis not present

## 2016-06-07 DIAGNOSIS — E785 Hyperlipidemia, unspecified: Secondary | ICD-10-CM | POA: Diagnosis present

## 2016-06-07 DIAGNOSIS — I214 Non-ST elevation (NSTEMI) myocardial infarction: Secondary | ICD-10-CM | POA: Diagnosis present

## 2016-06-07 DIAGNOSIS — Z79899 Other long term (current) drug therapy: Secondary | ICD-10-CM | POA: Diagnosis not present

## 2016-06-07 DIAGNOSIS — Z8546 Personal history of malignant neoplasm of prostate: Secondary | ICD-10-CM | POA: Diagnosis not present

## 2016-06-07 DIAGNOSIS — I480 Paroxysmal atrial fibrillation: Secondary | ICD-10-CM | POA: Diagnosis not present

## 2016-06-07 LAB — CBC
HEMATOCRIT: 41.6 % (ref 39.0–52.0)
Hemoglobin: 14 g/dL (ref 13.0–17.0)
MCH: 32.1 pg (ref 26.0–34.0)
MCHC: 33.7 g/dL (ref 30.0–36.0)
MCV: 95.4 fL (ref 78.0–100.0)
PLATELETS: 207 10*3/uL (ref 150–400)
RBC: 4.36 MIL/uL (ref 4.22–5.81)
RDW: 13.1 % (ref 11.5–15.5)
WBC: 6.5 10*3/uL (ref 4.0–10.5)

## 2016-06-07 LAB — ECHOCARDIOGRAM COMPLETE
HEIGHTINCHES: 69 in
Weight: 2941.82 oz

## 2016-06-07 LAB — CK TOTAL AND CKMB (NOT AT ARMC)
CK TOTAL: 980 U/L — AB (ref 49–397)
CK, MB: 98.7 ng/mL — ABNORMAL HIGH (ref 0.5–5.0)
Relative Index: 10.1 — ABNORMAL HIGH (ref 0.0–2.5)

## 2016-06-07 LAB — MRSA PCR SCREENING: MRSA by PCR: NEGATIVE

## 2016-06-07 LAB — TROPONIN I: TROPONIN I: 31.36 ng/mL — AB (ref <0.03)

## 2016-06-07 MED ORDER — APIXABAN 5 MG PO TABS
5.0000 mg | ORAL_TABLET | Freq: Two times a day (BID) | ORAL | Status: DC
  Administered 2016-06-07 – 2016-06-08 (×3): 5 mg via ORAL
  Filled 2016-06-07 (×3): qty 1

## 2016-06-07 MED ORDER — VALACYCLOVIR HCL 500 MG PO TABS
500.0000 mg | ORAL_TABLET | Freq: Every day | ORAL | Status: DC
  Administered 2016-06-07 – 2016-06-08 (×2): 500 mg via ORAL
  Filled 2016-06-07 (×2): qty 1

## 2016-06-07 MED ORDER — SODIUM CHLORIDE 0.9 % IV SOLN: 250.0000 mL | INTRAVENOUS | Status: DC | PRN

## 2016-06-07 MED ORDER — ACETAMINOPHEN 325 MG PO TABS: 650.0000 mg | ORAL_TABLET | ORAL | Status: DC | PRN

## 2016-06-07 MED ORDER — SODIUM CHLORIDE 0.9 % IV SOLN
INTRAVENOUS | Status: DC
  Administered 2016-06-07: 01:00:00 via INTRAVENOUS

## 2016-06-07 MED ORDER — ALPRAZOLAM 0.25 MG PO TABS
0.2500 mg | ORAL_TABLET | Freq: Three times a day (TID) | ORAL | Status: DC | PRN
  Administered 2016-06-07 (×2): 0.25 mg via ORAL
  Filled 2016-06-07 (×2): qty 1

## 2016-06-07 MED ORDER — PANTOPRAZOLE SODIUM 40 MG PO TBEC
40.0000 mg | DELAYED_RELEASE_TABLET | Freq: Every day | ORAL | Status: DC
  Filled 2016-06-07 (×2): qty 1

## 2016-06-07 MED ORDER — SODIUM CHLORIDE 0.9% FLUSH
3.0000 mL | Freq: Two times a day (BID) | INTRAVENOUS | Status: DC
  Administered 2016-06-07 – 2016-06-08 (×2): 3 mL via INTRAVENOUS

## 2016-06-07 MED ORDER — SODIUM CHLORIDE 0.9% FLUSH: 3.0000 mL | INTRAVENOUS | Status: DC | PRN

## 2016-06-07 MED ORDER — HYDRALAZINE HCL 25 MG PO TABS: 25.0000 mg | ORAL_TABLET | Freq: Three times a day (TID) | ORAL | Status: DC

## 2016-06-07 MED ORDER — FUROSEMIDE 40 MG PO TABS
40.0000 mg | ORAL_TABLET | Freq: Every day | ORAL | Status: DC
  Administered 2016-06-07 – 2016-06-08 (×2): 40 mg via ORAL
  Filled 2016-06-07 (×2): qty 1

## 2016-06-07 MED ORDER — POTASSIUM CHLORIDE CRYS ER 10 MEQ PO TBCR
10.0000 meq | EXTENDED_RELEASE_TABLET | Freq: Two times a day (BID) | ORAL | Status: DC
  Administered 2016-06-07 – 2016-06-08 (×3): 10 meq via ORAL
  Filled 2016-06-07 (×3): qty 1

## 2016-06-07 MED ORDER — HYDRALAZINE HCL 10 MG PO TABS
10.0000 mg | ORAL_TABLET | Freq: Three times a day (TID) | ORAL | Status: DC
  Administered 2016-06-07: 10 mg via ORAL
  Filled 2016-06-07: qty 1

## 2016-06-07 MED ORDER — ONDANSETRON HCL 4 MG/2ML IJ SOLN
4.0000 mg | Freq: Four times a day (QID) | INTRAMUSCULAR | Status: DC | PRN
  Administered 2016-06-07: 4 mg via INTRAVENOUS
  Filled 2016-06-07: qty 2

## 2016-06-07 MED ORDER — LIDOCAINE HCL (PF) 1 % IJ SOLN
INTRAMUSCULAR | Status: DC | PRN
  Administered 2016-06-06: 2 mL

## 2016-06-07 MED ORDER — HYDROMORPHONE HCL 1 MG/ML IJ SOLN
1.0000 mg | INTRAMUSCULAR | Status: DC | PRN
  Administered 2016-06-07 (×2): 1 mg via INTRAVENOUS
  Filled 2016-06-07 (×2): qty 1

## 2016-06-07 MED ORDER — VERAPAMIL HCL 2.5 MG/ML IV SOLN
INTRAVENOUS | Status: DC | PRN
  Administered 2016-06-06: 10 mL via INTRA_ARTERIAL

## 2016-06-07 MED ORDER — METOPROLOL SUCCINATE ER 100 MG PO TB24
100.0000 mg | ORAL_TABLET | Freq: Every day | ORAL | Status: DC
  Administered 2016-06-07 – 2016-06-08 (×2): 100 mg via ORAL
  Filled 2016-06-07 (×2): qty 1

## 2016-06-07 MED ORDER — ISOSORBIDE MONONITRATE ER 30 MG PO TB24
30.0000 mg | ORAL_TABLET | Freq: Every day | ORAL | Status: DC
  Administered 2016-06-07 – 2016-06-08 (×2): 30 mg via ORAL
  Filled 2016-06-07 (×2): qty 1

## 2016-06-07 MED ORDER — HYDROCODONE-ACETAMINOPHEN 5-325 MG PO TABS
1.0000 | ORAL_TABLET | Freq: Four times a day (QID) | ORAL | Status: DC
  Administered 2016-06-07 – 2016-06-08 (×6): 1 via ORAL
  Filled 2016-06-07 (×6): qty 1

## 2016-06-07 MED FILL — Nitroglycerin IV Soln 100 MCG/ML in D5W: INTRA_ARTERIAL | Qty: 10 | Status: AC

## 2016-06-07 NOTE — Progress Notes (Signed)
*  PRELIMINARY RESULTS* Echocardiogram 2D Echocardiogram has been performed.  Leavy Cella 06/07/2016, 11:52 AM

## 2016-06-07 NOTE — Care Management Note (Addendum)
Case Management Note  Patient Details  Name: Dennis Lewis MRN: [id number] Date of Birth: [birth year]  Subjective/Objective:           Adm w ch pain         Action/Plan: lives w fam   Expected Discharge Date:                  Expected Discharge Plan:  Home/Self Care  In-House Referral:     Discharge planning Services  CM Consult, Medication Assistance  Post Acute Care Choice:    Choice offered to:     DME Arranged:    DME Agency:     HH Arranged:    HH Agency:     Status of Service:  Completed, signed off  If discussed at [employer], dates discussed:    Additional Comments: gave pt 30day free and 10.00 per month copay card for eliquis. [employer] ins.S/W ANN  @ [employer] # [id number]   ELIQUIS 5 MG  BID  (30 ) 60 TAB  COVER- NOT COVER   Lacretia Leigh, RN 06/07/2016, 2:42 PM

## 2016-06-07 NOTE — Progress Notes (Addendum)
Patient Name: Dennis Lewis Date of Encounter: 06/07/2016  Active Problems:   Chest pain   Elevated troponin   Takotsubo cardiomyopathy   Length of Stay: 0  SUBJECTIVE  Overnight had one brief episode of milder chest discomfort, again radiating to jaw, now resolved. No dyspnea (at rest). No radial access problems. Has history of ACEi induced angioedema and palpitations worsening with hydralazine.  CURRENT MEDS . aspirin  324 mg Oral Once  . hydrALAZINE  10 mg Oral TID  . isosorbide mononitrate  30 mg Oral Daily  . metoprolol succinate  100 mg Oral Daily  . pantoprazole  40 mg Oral Daily  . sodium chloride flush  3 mL Intravenous Q12H  . valACYclovir  500 mg Oral Daily    OBJECTIVE   Intake/Output Summary (Last 24 hours) at 06/07/16 0836 Last data filed at 06/07/16 0759  Gross per 24 hour  Intake           711.91 ml  Output              600 ml  Net           111.91 ml   Filed Weights   06/07/16 0300  Weight: 83.4 kg (183 lb 13.8 oz)    PHYSICAL EXAM Vitals:   06/07/16 0400 06/07/16 0500 06/07/16 0600 06/07/16 0700  BP: 131/74 136/76 (!) 144/80 129/79  Pulse: 60 (!) 59 (!) 56 (!) 58  Resp: 13 15 (!) 9 17  Temp:      TempSrc:      SpO2: 92% 95% 97% 94%  Weight:      Height:       General: Alert, oriented x3, no distress Head: no evidence of trauma, PERRL, EOMI, no exophtalmos or lid lag, no myxedema, no xanthelasma; normal ears, nose and oropharynx Neck: normal jugular venous pulsations and no hepatojugular reflux; brisk carotid pulses without delay and no carotid bruits Chest: clear to auscultation, no signs of consolidation by percussion or palpation, normal fremitus, symmetrical and full respiratory excursions Cardiovascular: normal position and quality of the apical impulse, regular rhythm, normal first and paradoxically split second heart sounds, no rubs or gallops, no murmur Abdomen: no tenderness or distention, no masses by palpation, no abnormal  pulsatility or arterial bruits, normal bowel sounds, no hepatosplenomegaly Extremities: no clubbing, cyanosis or edema; 2+ radial, ulnar and brachial pulses bilaterally; 2+ right femoral, posterior tibial and dorsalis pedis pulses; 2+ left femoral, posterior tibial and dorsalis pedis pulses; no subclavian or femoral bruits Neurological: grossly nonfocal  LABS  CBC  Recent Labs  06/06/16 1957 06/07/16 0220  WBC 8.1 6.5  HGB 14.8 14.0  HCT 43.4 41.6  MCV 95.8 95.4  PLT 233 [id number]   Basic Metabolic Panel  Recent Labs  06/06/16 1957  NA 138  K 3.8  CL 104  CO2 24  GLUCOSE 101*  BUN 11  CREATININE 0.90  CALCIUM 9.7  MG 2.0   Liver Function Tests No results for input(s): AST, ALT, ALKPHOS, BILITOT, PROT, ALBUMIN in the last 72 hours. No results for input(s): LIPASE, AMYLASE in the last 72 hours. Cardiac Enzymes  Recent Labs  06/07/16 0220  CKTOTAL 980*  CKMB 98.7*  TROPONINI 31.36*    Radiology Studies Imaging results have been reviewed and Dg Chest Port 1 View  Result Date: 06/06/2016 CLINICAL DATA:  Epigastric pain starting around 9 a.m. today. Nonsmoker. EXAM: PORTABLE CHEST 1 VIEW COMPARISON:  None. FINDINGS: The heart size and mediastinal contours are  within normal limits. Both lungs are clear. The visualized skeletal structures are unremarkable. IMPRESSION: No active disease. Electronically Signed   By: Lucienne Capers M.D.   On: 06/06/2016 21:58    TELE NSR  ECG NSR, LBBB  ASSESSMENT AND PLAN  1. Takotsubo sd: unusually high troponin increase for this diagnosis. Will check echo to look for LV thrombus as source of transient coronary embolism. 2. Preexisting nonischemic cardiomyopathy: history of angioedema with ACEi, will try hydralazine again (he was intolerant due to palpitations worsening, not a true allergy). Wean off IV NTG and possibly transfer to telemetry later today. 3. CHF: combined systolic and diastolic. Despite no clinical HF at rest, LVEDP  was high at 26 mm Hg. Start low dose diuretic and K supplement. Option for CRT (+/- ICD, depending on degree of EF recovery). Discussed symptoms and signs of CHF and Na restricted diet. 4. Minor CAD by cath:  Need to review lipid profile as outpatient and treat accordingly, target LDL <100, preferably <70    Sanda Klein, MD, [facility] [phone] office [phone] pager 06/07/2016 8:36 AM

## 2016-06-07 NOTE — Progress Notes (Signed)
ANTICOAGULATION CONSULT NOTE - Follow Up Consult  Pharmacy Consult for Apixaban Indication: atrial fibrillation  Allergies  Allergen Reactions  . Ace Inhibitors Other (See Comments)    Lip swelling/blisters   . Chocolate Other (See Comments)    Blisters   . Other Other (See Comments)    Seed on bread causes lip blisters Anesthesia also, but does nor remember which one  . Peanut-Containing Drug Products Other (See Comments)    Blisters  . Hydralazine Palpitations    Irregular heartbeat, fluttering    Patient Measurements: Height: 5\' 9"  (175.3 cm) Weight: 183 lb 13.8 oz (83.4 kg) IBW/kg (Calculated) : 70.7  Vital Signs: Temp: 97.8 F (36.6 C) (09/18 1200) Temp Source: Oral (09/18 1200) BP: 144/93 (09/18 1300) Pulse Rate: 67 (09/18 1300)  Labs:  Recent Labs  06/06/16 1957 06/07/16 0220  HGB 14.8 14.0  HCT 43.4 41.6  PLT 233 207  LABPROT 12.4  --   INR 0.93  --   CREATININE 0.90  --   CKTOTAL  --  980*  CKMB  --  98.7*  TROPONINI  --  31.36*    Estimated Creatinine Clearance: 86.2 mL/min (by C-G formula based on SCr of 0.9 mg/dL).  Assessment: 60yom with Takotsubo cardiomyopathy to begin apixaban for new afib. He does not meet any criteria for dose adjustment so will use 5mg  bid.   Goal of Therapy:  Monitor platelets by anticoagulation protocol: Yes   Plan:  1) Apixaban 5mg  po bid 2) Case management consult for cost 3) Will provide education  Deboraha Sprang 06/07/2016,2:37 PM

## 2016-06-07 NOTE — Progress Notes (Signed)
CARDIAC REHAB PHASE I   PRE:  Rate/Rhythm: 68 ? afib with BBB  Very irreg  BP:  Supine: 116/73  Sitting:   Standing:    SaO2: 98%RA  MODE:  Ambulation: 700 ft   POST:  Rate/Rhythm: 93  BP:  Supine:   Sitting: 140/80  Standing:    SaO2: 99%RA 1450-1520 Pt walked 700 ft on RA with steady gait. No CP or SOB. Heart rate very irregular. Progress note stated NSR by EKG but very irregular before and after walk. Hanley Seamen pt article on takot subo cardiomyopathy. Discussed CRP 2 and will refer to [place]. Will follow up tomorrow for ed.   Graylon Good, RN BSN  06/07/2016 3:17 PM

## 2016-06-07 NOTE — Progress Notes (Addendum)
Patient complaining of heart palpitations like he previously experienced when taking hydralazine. Have notified physician in Amnion for further instructions.   RN provided patient with literature and reviewed materials on heart failure and cardiomyopathy.   1117 Verbal order to d/c  Hydralazine.

## 2016-06-08 ENCOUNTER — Other Ambulatory Visit: Payer: Self-pay | Admitting: Cardiology

## 2016-06-08 DIAGNOSIS — I5021 Acute systolic (congestive) heart failure: Secondary | ICD-10-CM

## 2016-06-08 DIAGNOSIS — I480 Paroxysmal atrial fibrillation: Secondary | ICD-10-CM

## 2016-06-08 DIAGNOSIS — I25110 Atherosclerotic heart disease of native coronary artery with unstable angina pectoris: Secondary | ICD-10-CM

## 2016-06-08 LAB — BASIC METABOLIC PANEL
Anion gap: 6 (ref 5–15)
BUN: 12 mg/dL (ref 6–20)
CALCIUM: 9.2 mg/dL (ref 8.9–10.3)
CO2: 30 mmol/L (ref 22–32)
CREATININE: 0.94 mg/dL (ref 0.61–1.24)
Chloride: 103 mmol/L (ref 101–111)
GFR calc non Af Amer: >60 mL/min (ref >60)
GLUCOSE: 103 mg/dL — AB (ref 65–99)
Potassium: 4.7 mmol/L (ref 3.5–5.1)
Sodium: 139 mmol/L (ref 135–145)

## 2016-06-08 LAB — CBC
HEMATOCRIT: 42.4 % (ref 39.0–52.0)
Hemoglobin: 14.3 g/dL (ref 13.0–17.0)
MCH: 32.4 pg (ref 26.0–34.0)
MCHC: 33.7 g/dL (ref 30.0–36.0)
MCV: 95.9 fL (ref 78.0–100.0)
Platelets: 195 10*3/uL (ref 150–400)
RBC: 4.42 MIL/uL (ref 4.22–5.81)
RDW: 13.5 % (ref 11.5–15.5)
WBC: 6.6 10*3/uL (ref 4.0–10.5)

## 2016-06-08 MED ORDER — APIXABAN 5 MG PO TABS: 5.0000 mg | ORAL_TABLET | Freq: Two times a day (BID) | ORAL | 10 refills | Status: DC

## 2016-06-08 MED ORDER — FUROSEMIDE 40 MG PO TABS: 40.0000 mg | ORAL_TABLET | Freq: Every day | ORAL | 6 refills | Status: DC

## 2016-06-08 MED ORDER — APIXABAN 5 MG PO TABS: 5.0000 mg | ORAL_TABLET | Freq: Two times a day (BID) | ORAL | 0 refills | Status: DC

## 2016-06-08 MED ORDER — POTASSIUM CHLORIDE CRYS ER 10 MEQ PO TBCR: 10.0000 meq | EXTENDED_RELEASE_TABLET | Freq: Two times a day (BID) | ORAL | 6 refills | Status: DC

## 2016-06-08 MED ORDER — ISOSORBIDE MONONITRATE ER 30 MG PO TB24: 30.0000 mg | ORAL_TABLET | Freq: Every day | ORAL | 6 refills | Status: DC

## 2016-06-08 NOTE — Discharge Instructions (Signed)
Information on my medicine - ELIQUIS (apixaban)  This medication education was reviewed with me or my healthcare representative as part of my discharge preparation.  The pharmacist that spoke with me during my hospital stay was:  Deboraha Sprang, [facility]  Why was Eliquis prescribed for you? Eliquis was prescribed for you to reduce the risk of forming blood clots that can cause a stroke if you have a medical condition called atrial fibrillation (a type of irregular heartbeat) OR to reduce the risk of a blood clots forming after orthopedic surgery.  What do You need to know about Eliquis ? Take your Eliquis TWICE DAILY - one tablet in the morning and one tablet in the evening with or without food.  It would be best to take the doses about the same time each day.  If you have difficulty swallowing the tablet whole please discuss with your pharmacist how to take the medication safely.  Take Eliquis exactly as prescribed by your doctor and DO NOT stop taking Eliquis without talking to the doctor who prescribed the medication.  Stopping may increase your risk of developing a new clot or stroke.  Refill your prescription before you run out.  After discharge, you should have regular check-up appointments with your healthcare provider that is prescribing your Eliquis.  In the future your dose may need to be changed if your kidney function or weight changes by a significant amount or as you get older.  What do you do if you miss a dose? If you miss a dose, take it as soon as you remember on the same day and resume taking twice daily.  Do not take more than one dose of ELIQUIS at the same time.  Important Safety Information A possible side effect of Eliquis is bleeding. You should call your healthcare provider right away if you experience any of the following: ? Bleeding from an injury or your nose that does not stop. ? Unusual colored urine (red or dark brown) or unusual colored stools (red or  black). ? Unusual bruising for unknown reasons. ? A serious fall or if you hit your head (even if there is no bleeding).  Some medicines may interact with Eliquis and might increase your risk of bleeding or clotting while on Eliquis. To help avoid this, consult your healthcare provider or pharmacist prior to using any new prescription or non-prescription medications, including herbals, vitamins, non-steroidal anti-inflammatory drugs (NSAIDs) and supplements.  This website has more information on Eliquis (apixaban): [email or web address].

## 2016-06-08 NOTE — Discharge Summary (Signed)
Discharge Summary    Patient ID: Dennis Lewis,  MRN: [id number], DOB/AGE: Jul 28, 1956 60 y.o.  Admit date: 06/06/2016 Discharge date: 06/08/2016  Primary Care Provider: [name] Primary Cardiologist: Dr. [name]  Discharge Diagnoses    Active Problems:   Chest pain   Elevated troponin   Takotsubo cardiomyopathy   Allergies Allergies  Allergen Reactions  . Ace Inhibitors Other (See Comments)    Lip swelling/blisters   . Chocolate Other (See Comments)    Blisters   . Other Other (See Comments)    Seed on bread causes lip blisters Anesthesia also, but does nor remember which one  . Peanut-Containing Drug Products Other (See Comments)    Blisters  . 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  The left ventricular ejection fraction is 25-35% by visual estimate. The pattern of apical wall motion abnormality without severe CAD is suggestive of Takotsubo cardiomyopathy.  LV end diastolic pressure is moderately elevated.  There is no aortic valve stenosis.   Continue with aggressive medical therapy. He is allergic to ace inhibitors. Will add isosorbide to his hydralazine. Continue beta blocker. 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 He states that he was lying in bed for about 3 hours very worried about what he would do. He then started to have chest discomfort. The history is also consistent with Takatsubo cardiomyopathy.  May need diuresis given moderately elevated LVEDP.   _____________   History of Present Illness     60 yo male with PMH NICM (35-40%), NSVT, HTN. HLD who presented to the [facility] ED on 9/17 with reports of chest pain that started around 10am that morning that waxed and waned throughout the day. In the ED his EKG showed a LBBB, and labs with  significantly elevated troponin. He was started on IV nitro, and given IV morphine. He continued to have persistent pain, and was brought to the cath labs for emergent cardiac catheterization.   Hospital Course     Consultants: None.  His LHC showed only 30% Prox RCA lesion, with EF of 25-35% with a pattern of apical wall motion abnormality without severe CAD suggestive of Takotsubo cardiomyopathy. LVEDP was moderately elevated. His BB was continued and isosorbide was added to his hydralazine.   The following day he reported having a brief episode of chest discomfort, but no dyspnea. His trop peaked at 31.36, so 2D echo was ordered to check for LV thrombus for source of transient coronary embolism. He was also started on low dose diuretic and K supplement given his elevated LVEDP though no clinical signs of HF. We attempted to add back his hydralazine this admission but had to discontinue given his reports of palpitations similar to those experienced with taking this medication in the past. He was able to ambulate with cardiac rehab without complaints of chest pain or dyspnea.   During this admission he was noted to display atrial fibrillation, which was felt to be likely paroxysmal but could have caused his palpitations in the past. His CHADSVasc 5 (embolism 2, HF, CAD, HTN). He was started on Eliquis for anticoagulation. No antiarrhythmics at this time as the risk likely outweighs the benefits. ACEi was considered given his NICM, but deferred given reports of angioedema in the past.    He  was seen and assessed by Dr. Sallyanne Kuster on 9/19 and determined stable for discharge home. I have arranged for f/u echo in 6 weeks, and f/u appt after echo per discussion with Dr. Sallyanne Kuster. He has been given a 30 day card for his Eliquis via CM prior to discharge. He will need f/u on his lipid panel at his follow up appt.  _____________  Discharge Vitals Blood pressure 130/69, pulse 76, temperature 98.2 F (36.8 C),  temperature source Oral, resp. rate 18, height 5\' 9"  (1.753 m), weight 182 lb 8.7 oz (82.8 kg), SpO2 97 %.  Filed Weights   06/07/16 0300 06/08/16 0500  Weight: 183 lb 13.8 oz (83.4 kg) 182 lb 8.7 oz (82.8 kg)    Labs & Radiologic Studies    CBC  Recent Labs  06/07/16 0220 06/08/16 0236  WBC 6.5 6.6  HGB 14.0 14.3  HCT 41.6 42.4  MCV 95.4 95.9  PLT 207 0000000   Basic Metabolic Panel  Recent Labs  06/06/16 1957 06/08/16 0236  NA 138 139  K 3.8 4.7  CL 104 103  CO2 24 30  GLUCOSE 101* 103*  BUN 11 12  CREATININE 0.90 0.94  CALCIUM 9.7 9.2  MG 2.0  --    Liver Function Tests No results for input(s): AST, ALT, ALKPHOS, BILITOT, PROT, ALBUMIN in the last 72 hours. No results for input(s): LIPASE, AMYLASE in the last 72 hours. Cardiac Enzymes  Recent Labs  06/07/16 0220  CKTOTAL 980*  CKMB 98.7*  TROPONINI 31.36*   BNP Invalid input(s): POCBNP D-Dimer No results for input(s): DDIMER in the last 72 hours. Hemoglobin A1C No results for input(s): HGBA1C in the last 72 hours. Fasting Lipid Panel No results for input(s): CHOL, HDL, LDLCALC, TRIG, CHOLHDL, LDLDIRECT in the last 72 hours. Thyroid Function Tests No results for input(s): TSH, T4TOTAL, T3FREE, THYROIDAB in the last 72 hours.  Invalid input(s): FREET3 _____________  Dg Chest Port 1 View  Result Date: 06/06/2016 CLINICAL DATA:  Epigastric pain starting around 9 a.m. today. Nonsmoker. EXAM: PORTABLE CHEST 1 VIEW COMPARISON:  None. FINDINGS: The heart size and mediastinal contours are within normal limits. Both lungs are clear. The visualized skeletal structures are unremarkable. IMPRESSION: No active disease. Electronically Signed   By: Lucienne Capers M.D.   On: 06/06/2016 21:58   Disposition   Pt is being discharged home today in good condition.  Follow-up Plans & Appointments    Follow-up Information    [facility], [place] Follow up on 07/28/2016.   Specialties:  Cardiology, Radiology Why:  at 9:15  for your hospital follow up and follow up after your echo. Contact information: [address] [place] [address] [phone]        [facility] Follow up on 07/20/2016.   Why:  at 8:45am for you follow up echo of your heart.  Contact information: [place] [phone]         Discharge Instructions    ([place]) Call MD:  Anytime you have any of the following symptoms: 1) 3 pound weight gain in 24 hours or 5 pounds in 1 week 2) shortness of breath, with or without a dry hacking cough 3) swelling in the hands, feet or stomach 4) if you have to sleep on extra pillows at night in order to breathe.    Complete by:  As directed    Amb Referral to Cardiac Rehabilitation    Complete by:  As  directed    Diagnosis:  NSTEMI   Diet - low sodium heart healthy    Complete by:  As directed    Increase activity slowly    Complete by:  As directed       Discharge Medications   Current Discharge Medication List    START taking these medications   Details  apixaban (ELIQUIS) 5 MG TABS tablet Take 1 tablet (5 mg total) by mouth 2 (two) times daily. Qty: 60 tablet, Refills: 0    furosemide (LASIX) 40 MG tablet Take 1 tablet (40 mg total) by mouth daily. Qty: 30 tablet, Refills: 6    isosorbide mononitrate (IMDUR) 30 MG 24 hr tablet Take 1 tablet (30 mg total) by mouth daily. Qty: 30 tablet, Refills: 6    potassium chloride (K-DUR,KLOR-CON) 10 MEQ tablet Take 1 tablet (10 mEq total) by mouth 2 (two) times daily. Qty: 60 tablet, Refills: 6      CONTINUE these medications which have NOT CHANGED   Details  dexlansoprazole (DEXILANT) 60 MG capsule Take 60 mg by mouth daily.    metoprolol succinate (TOPROL-XL) 100 MG 24 hr tablet Take 1 tablet (100 mg total) by mouth daily. Qty: 30 tablet, Refills: 9   Associated Diagnoses: Palpitations    valACYclovir (VALTREX) 1000 MG tablet Take 1 g by mouth daily as  needed (for outbreaks).       STOP taking these medications     hydrALAZINE (APRESOLINE) 25 MG tablet           Outstanding Labs/Studies   FLP  Duration of Discharge Encounter   Greater than 30 minutes including physician time.  Signed, Reino Bellis NP-C 06/08/2016, 12:21 PM

## 2016-06-08 NOTE — Progress Notes (Signed)
Patient Name: Dennis Lewis Date of Encounter: 06/08/2016  Active Problems:   Chest pain   Elevated troponin   Takotsubo cardiomyopathy   Length of Stay: 1  SUBJECTIVE  Occasionally aware of palpitations, otherwise feels very well. No dyspnea or angina walking in unit.  CURRENT MEDS . apixaban  5 mg Oral BID  . furosemide  40 mg Oral Daily  . HYDROcodone-acetaminophen  1 tablet Oral Q6H  . isosorbide mononitrate  30 mg Oral Daily  . metoprolol succinate  100 mg Oral Daily  . pantoprazole  40 mg Oral Daily  . potassium chloride  10 mEq Oral BID  . sodium chloride flush  3 mL Intravenous Q12H  . valACYclovir  500 mg Oral Daily    OBJECTIVE   Intake/Output Summary (Last 24 hours) at 06/08/16 1002 Last data filed at 06/08/16 0100  Gross per 24 hour  Intake                0 ml  Output              500 ml  Net             -500 ml   Filed Weights   06/07/16 0300 06/08/16 0500  Weight: 83.4 kg (183 lb 13.8 oz) 82.8 kg (182 lb 8.7 oz)    PHYSICAL EXAM Vitals:   06/08/16 0600 06/08/16 0700 06/08/16 0800 06/08/16 0900  BP: 136/87 123/77 126/87 130/69  Pulse: (!) 59 (!) 59 72 76  Resp: 19 16 16 18   Temp:      TempSrc:      SpO2: 96% 96% 94% 97%  Weight:      Height:       General: Alert, oriented x3, no distress Head: no evidence of trauma, PERRL, EOMI, no exophtalmos or lid lag, no myxedema, no xanthelasma; normal ears, nose and oropharynx Neck: normal jugular venous pulsations and no hepatojugular reflux; brisk carotid pulses without delay and no carotid bruits Chest: clear to auscultation, no signs of consolidation by percussion or palpation, normal fremitus, symmetrical and full respiratory excursions Cardiovascular: normal position and quality of the apical impulse, irregular rhythm, normal first and paradoxically split second heart sounds, no rubs or gallops, no murmur Abdomen: no tenderness or distention, no masses by palpation, no abnormal pulsatility or arterial  bruits, normal bowel sounds, no hepatosplenomegaly Extremities: no clubbing, cyanosis or edema; 2+ radial, ulnar and brachial pulses bilaterally; 2+ right femoral, posterior tibial and dorsalis pedis pulses; 2+ left femoral, posterior tibial and dorsalis pedis pulses; no subclavian or femoral bruits Neurological: grossly nonfocal  LABS  CBC  Recent Labs  06/07/16 0220 06/08/16 0236  WBC 6.5 6.6  HGB 14.0 14.3  HCT 41.6 42.4  MCV 95.4 95.9  PLT 207 0000000   Basic Metabolic Panel  Recent Labs  06/06/16 1957 06/08/16 0236  NA 138 139  K 3.8 4.7  CL 104 103  CO2 24 30  GLUCOSE 101* 103*  BUN 11 12  CREATININE 0.90 0.94  CALCIUM 9.7 9.2  MG 2.0  --    Cardiac Enzymes  Recent Labs  06/07/16 0220  CKTOTAL 980*  CKMB 98.7*  TROPONINI 31.36*    Radiology Studies Imaging results have been reviewed and Dg Chest Port 1 View  Result Date: 06/06/2016 CLINICAL DATA:  Epigastric pain starting around 9 a.m. today. Nonsmoker. EXAM: PORTABLE CHEST 1 VIEW COMPARISON:  None. FINDINGS: The heart size and mediastinal contours are within normal limits. Both lungs are  clear. The visualized skeletal structures are unremarkable. IMPRESSION: No active disease. Electronically Signed   By: Lucienne Capers M.D.   On: 06/06/2016 21:58    TELE AF controlled V rate  ECHO - Left ventricle: The cavity size was normal. There was mild   concentric hypertrophy. Systolic function was severely reduced.   The estimated ejection fraction was in the range of 25% to 30%.   Diffuse hypokinesis with no identifiable regional variations. No   evidence of thrombus. - Left atrium: The atrium was moderately dilated.   ASSESSMENT AND PLAN  1. NSTEMI: retrospectively, he probably had an embolic MI rather than takotsubo sd.. No residual angina. 2. Atrial fibrillation: newly diagnosed, but likely paroxysmal AF in past caused his palpitations. Well rate controlled and not associated with hemodynamic  decompensation or other symptoms. CHADSVasc 5 (embolism 2, HF, CAD, HTN). DOAC started yesterday. Antiarrhythmics do not appear justified -risk probably outweighs benefit. 3. CHF: combined systolic and diastolic. Despite no clinical HF at rest, LVEDP was high at 26 mm Hg. Started low dose diuretic and K supplement. Discussed symptoms and signs of CHF and Na restricted diet. 4. Preexisting nonischemic cardiomyopathy: history of angioedema with ACEi. Recheck EF in 40 days after MI. Option for CRT (+/- ICD, depending on degree of EF recovery). He associates it with palpitations, but I would try hydralazine again when in NSR. 5. Minor CAD by cath:  Need to review lipid profile as outpatient and treat accordingly, target LDL <100, preferably <70.   Sanda Klein, MD, [facility] [phone] office [phone] pager 06/08/2016 10:02 AM

## 2016-06-08 NOTE — Plan of Care (Signed)
Problem: Activity: Goal: Ability to return to baseline activity level will improve Outcome: Progressing Pt up, walking in hall.  Problem: Cardiac: Goal: Ability to achieve and maintain adequate cardiopulmonary perfusion will improve Outcome: Progressing WNL Goal: Vascular access site(s) Level 0-1 will be maintained Outcome: Progressing Site level 0  Problem: Education: Goal: Understanding of cardiac disease, CV risk reduction, and recovery process will improve Outcome: Progressing Education preformed by previous Therapist, sports

## 2016-06-08 NOTE — Progress Notes (Signed)
CARDIAC REHAB PHASE I   PRE:  Rate/Rhythm: 78 afib  BP:  Supine:   Sitting: 130/69  Standing:    SaO2:   MODE:  Ambulation: 1050 ft   POST:  Rate/Rhythm: 100 afib  BP:  Supine:   Sitting: 156/97  Standing:    SaO2: 98%RA 1015-1103 Pt walked 1050 ft with steady gait. No CP and tolerated well. MI education completed with pt and wife. Also gave CHF booklet and reviewed zones. Gave low sodium diets and discussed 2000 mg restriction. Pt knows importance of daily weights and reviewed 2L FR. Discussed atrial fib and importance of anticoagulant. RN to give OFF the BEAT booklet from pyxis to pt.  Understanding voiced by pt and wife of understanding. Referring to [place] Phase 2. Discussed decreasing stress also.   Dennis Good, RN BSN  06/08/2016 10:57 AM

## 2016-06-15 ENCOUNTER — Telehealth: Payer: Self-pay | Admitting: *Deleted

## 2016-06-15 NOTE — Telephone Encounter (Signed)
Received yellow sheet filled out by wife explaining that "patient was just released from hospital last wed - having aching in back and back of head as well as muscular pain"  Was recommended to call patient at listed mobile number. Goes to VM message indicating VM box is full.

## 2016-06-18 NOTE — Telephone Encounter (Signed)
Have again attempted to reach patient without success, due to full VM box.

## 2016-07-20 ENCOUNTER — Other Ambulatory Visit: Payer: Self-pay

## 2016-07-20 ENCOUNTER — Ambulatory Visit (HOSPITAL_COMMUNITY): Payer: Managed Care, Other (non HMO) | Attending: Cardiology

## 2016-07-20 DIAGNOSIS — I5021 Acute systolic (congestive) heart failure: Secondary | ICD-10-CM | POA: Insufficient documentation

## 2016-07-28 ENCOUNTER — Encounter: Payer: Self-pay | Admitting: Physician Assistant

## 2016-07-28 ENCOUNTER — Ambulatory Visit (INDEPENDENT_AMBULATORY_CARE_PROVIDER_SITE_OTHER): Payer: Managed Care, Other (non HMO) | Admitting: Physician Assistant

## 2016-07-28 VITALS — BP 148/87 | HR 83 | Ht 69.0 in | Wt 186.6 lb

## 2016-07-28 DIAGNOSIS — I10 Essential (primary) hypertension: Secondary | ICD-10-CM

## 2016-07-28 DIAGNOSIS — E785 Hyperlipidemia, unspecified: Secondary | ICD-10-CM

## 2016-07-28 DIAGNOSIS — I428 Other cardiomyopathies: Secondary | ICD-10-CM

## 2016-07-28 DIAGNOSIS — I447 Left bundle-branch block, unspecified: Secondary | ICD-10-CM

## 2016-07-28 DIAGNOSIS — I5022 Chronic systolic (congestive) heart failure: Secondary | ICD-10-CM

## 2016-07-28 DIAGNOSIS — I2510 Atherosclerotic heart disease of native coronary artery without angina pectoris: Secondary | ICD-10-CM | POA: Diagnosis not present

## 2016-07-28 DIAGNOSIS — I480 Paroxysmal atrial fibrillation: Secondary | ICD-10-CM

## 2016-07-28 LAB — CBC
HEMATOCRIT: 44.2 % (ref 38.5–50.0)
HEMOGLOBIN: 15.3 g/dL (ref 13.2–17.1)
MCH: 32.3 pg (ref 27.0–33.0)
MCHC: 34.6 g/dL (ref 32.0–36.0)
MCV: 93.2 fL (ref 80.0–100.0)
MPV: 9.6 fL (ref 7.5–12.5)
Platelets: 270 10*3/uL (ref 140–400)
RBC: 4.74 MIL/uL (ref 4.20–5.80)
RDW: 13.6 % (ref 11.0–15.0)
WBC: 5.5 10*3/uL (ref 3.8–10.8)

## 2016-07-28 MED ORDER — HYDRALAZINE HCL 25 MG PO TABS: 25.0000 mg | ORAL_TABLET | Freq: Three times a day (TID) | ORAL | 6 refills | Status: DC

## 2016-07-28 NOTE — Progress Notes (Signed)
Cardiology Office Note    Date:  07/28/2016   ID:  Dennis Lewis, DOB [birth year], MRN [id number]  PCP:  [name], MD  Cardiologist:  Dr. [name]  Chief Complaint  Patient presents with  . Hospitalization Follow-up    seen for Dr. [name], [place], minimal CAD on cath, new PAF    History of Present Illness:  Dennis Lewis is a 60 y.o. male with PMH of hypertension, GERD, hyperlipidemia, chronic left bundle branch block, history of NSVT, prostate cancer s/p radiation therapy and NICM with EF 35-40%. He had a cardiac cath in 1990s. Most recently, he was admitted for NSTEMI on 06/06/2016, he underwent emergent cardiac catheterization which only showed 30% proximal RCA disease, 25-35% ejection fraction, pattern of apical wall motion abnormality without severe CAD suggestive of Takotsubo cardiomyopathy. Surprisingly, his troponin peaked at 31.36 which is a lot higher than expected for stress-induced cardiomyopathy. Retrospectively, it is possible that he had an embolic MI. Given his allergy to ACE inhibitor, Imdur and hydralazine was added. Initial echo obtained in September showed ejection fraction 25-30%. He was also noted to have new atrial fibrillation, due to elevated CHA2DS2-Vasc score, he was started on eliquis. Repeat echocardiogram 6 weeks later on 07/20/2016 showed ejection fraction has improved to [id number], grade 3 diastolic dysfunction, paradoxical ventricular septal motion consistent with LBBB.   Patient has been doing well since left the hospital, for the past 6 weeks, he did have 3 episodes of palpitation, mainly occurred at nighttime. I have advised him to take Toprol-XL and night. He remained in atrial fibrillation based on today's EKG and physical exam, however heart rate is very well controlled, 80s in the office today, in the 60s at home. He denies any significant chest discomfort, lower extremity edema, orthopnea or PND episodes. He has been compliant with his medication. He is on both  Toprol-XL and isosorbide mononitrate. His recent echo showed improved ejection fraction. However patient is not on hydralazine as apparently it is listed as one to allergies to potentially cause irregular heartbeat and a fluttering sensation. At this point, I am wondering whether the irregular heart rhythm he felt his not truly related to hydralazine but instead he was having intermittent atrial fibrillation back then. As a trial, we will put him back on hydralazine 25 mg 3 times a day, especially given the fact that he has chronically elevated blood pressure with grade 3 diastolic dysfunction. Otherwise he has no cardiac awareness of atrial fibrillation. I will defer to Dr. [name] to see if he would like outpatient cardioversion on follow-up. If he continued to have palpitation, I will increase his Toprol-XL to 150 mg daily or 200 mg twice a day.   Past Medical History:  Diagnosis Date  . Anxiety   . Cardiomyopathy due to hypertension, without heart failure ([place])   . 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 GERD (gastroesophageal reflux disease)   . H/O cardiovascular stress test 2010   [name]  . H/O echocardiogram 2010    mild LVH with some septal dyssynergy, EF 45%, impaired relaxation  . Hyperlipidemia   . Hypertension   . Left bundle branch block   . NSVT (nonsustained ventricular tachycardia) ([place])   . Palpitations   . Prostate cancer ([place]) 04/02/14   Gleason 7, volume 30 gm  . S/P radiation therapy 06/26/2014 through 08/22/2014  Prostate 7800 cGy in 40 sessions                        . Torn tendon    right shoulder    Past Surgical History:  Procedure Laterality Date  . BACK SURGERY  1993  . CARDIAC CATHETERIZATION  1990   Normal cors  . CARDIAC CATHETERIZATION N/A 06/06/2016   Procedure: Left Heart Cath and Coronary Angiography;  Surgeon: Jettie Booze, MD;  Location: [facility];  Service:  Cardiovascular;  Laterality: N/A;  . PROSTATE BIOPSY  04/02/14   Gleason 7, vol 30 gm    Current Medications: Outpatient Medications Prior to Visit  Medication Sig Dispense Refill  . apixaban (ELIQUIS) 5 MG TABS tablet Take 1 tablet (5 mg total) by mouth 2 (two) times daily. 60 tablet 0  . dexlansoprazole (DEXILANT) 60 MG capsule Take 60 mg by mouth daily.    . furosemide (LASIX) 40 MG tablet Take 1 tablet (40 mg total) by mouth daily. 30 tablet 6  . isosorbide mononitrate (IMDUR) 30 MG 24 hr tablet Take 1 tablet (30 mg total) by mouth daily. 30 tablet 6  . metoprolol succinate (TOPROL-XL) 100 MG 24 hr tablet Take 1 tablet (100 mg total) by mouth daily. 30 tablet 9  . potassium chloride (K-DUR,KLOR-CON) 10 MEQ tablet Take 1 tablet (10 mEq total) by mouth 2 (two) times daily. 60 tablet 6  . valACYclovir (VALTREX) 1000 MG tablet Take 1 g by mouth daily as needed (for outbreaks).      No facility-administered medications prior to visit.      Allergies:   Ace inhibitors; Chocolate; Other; Peanut-containing drug products; and Hydralazine   Social History   Social History  . Marital status: Single    Spouse name: N/A  . Number of children: 2  . Years of education: N/A   Occupational History  . Nurse, adult    Social History Main Topics  . Smoking status: Former Smoker    Types: Cigarettes    Quit date: 02/22/1979  . Smokeless tobacco: Never Used  . Alcohol use Yes     Comment: beer daily  . Drug use: No  . Sexual activity: Not Asked   Other Topics Concern  . None   Social History Narrative  . None     Family History:  The patient's family history includes Esophageal cancer in his father; Prostate cancer in his brother; Throat cancer (age of onset: 55) in his father.   ROS:   Please see the history of present illness.    ROS All other systems reviewed and are negative.   PHYSICAL EXAM:   VS:  BP (!) 148/87   Pulse 83   Ht 5\' 9"  (1.753 m)   Wt 186 lb 9.6 oz (84.6  kg)   BMI 27.56 kg/m    GEN: Well nourished, well developed, in no acute distress  HEENT: normal  Neck: no JVD, carotid bruits, or masses Cardiac: Irregularly irregular; no murmurs, rubs, or gallops,no edema  Respiratory:  clear to auscultation bilaterally, normal work of breathing GI: soft, nontender, nondistended, + BS MS: no deformity or atrophy  Skin: warm and dry, no rash Neuro:  Alert and Oriented x 3, Strength and sensation are intact Psych: euthymic mood, full affect  Wt Readings from Last 3 Encounters:  07/28/16 186 lb 9.6 oz (84.6 kg)  06/08/16 182 lb 8.7 oz (82.8 kg)  04/23/16 185 lb 9.6 oz (  84.2 kg)      Studies/Labs Reviewed:   EKG:  EKG is ordered today.  The ekg ordered today demonstrates Atrial fibrillation, left bundle-branch block.  Recent Labs: 06/06/2016: Magnesium 2.0 06/08/2016: BUN 12; Creatinine, Ser 0.94; Potassium 4.7; Sodium 139 07/28/2016: Hemoglobin 15.3; Platelets 270   Lipid Panel No results found for: CHOL, TRIG, HDL, CHOLHDL, VLDL, LDLCALC, LDLDIRECT  Additional studies/ records that were reviewed today include:   Cath 06/06/2016 Conclusion     Prox RCA lesion, 30 %stenosed at a bend.  The left ventricular ejection fraction is 25-35% by visual estimate. The pattern of apical wall motion abnormality without severe CAD is suggestive of Takotsubo cardiomyopathy.  LV end diastolic pressure is moderately elevated.  There is no aortic valve stenosis.   Continue with aggressive medical therapy. He is allergic to ace inhibitors. Will add isosorbide to his hydralazine. Continue beta blocker. 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 He states that he was lying in bed for about 3 hours very worried about what he would do. He then started to have chest discomfort. The history is also consistent with Takatsubo cardiomyopathy.  May need diuresis given moderately elevated LVEDP.    Echo 06/07/2016 LV EF: 25% -   30%  Study Conclusions  - Left ventricle: The cavity size was normal. There was mild   concentric hypertrophy. Systolic function was severely reduced.   The estimated ejection fraction was in the range of 25% to 30%.   Diffuse hypokinesis with no identifiable regional variations. No   evidence of thrombus. - Left atrium: The atrium was moderately dilated.  Impressions:  - Rhythm appears to be atrial fibrillation.   Echo 07/20/2016 LV EF: 40% -   45%  - Left ventricle: The cavity size was normal. Systolic function was   mildly to moderately reduced. The estimated ejection fraction was   in the range of 40% to 45%. Diffuse hypokinesis. There was a   reduced contribution of atrial contraction to ventricular   filling, due to increased ventricular diastolic pressure or   atrial contractile dysfunction. Doppler parameters are consistent   with a reversible restrictive pattern, indicative of decreased   left ventricular diastolic compliance and/or increased left   atrial pressure (grade 3 diastolic dysfunction). - Ventricular septum: Septal motion showed moderate paradox. These   changes are consistent with intraventricular conduction delay. - Left atrium: The atrium was mildly dilated. - Pulmonary arteries: PA peak pressure: 40 mm Hg (S).  Impressions:  - The right ventricular systolic pressure was increased consistent   with mild pulmonary hypertension.    ASSESSMENT:    1. PAF (paroxysmal atrial fibrillation) ([place])   2. Coronary artery disease involving native coronary artery of native heart without angina pectoris   3. NICM (nonischemic cardiomyopathy) ([place])   4. Chronic systolic heart failure ([place])   5. LBBB (left bundle branch block)   6. Essential hypertension   7. Hyperlipidemia, unspecified hyperlipidemia type      PLAN:  In order of problems listed above:  1. Persistent atrial fibrillation on eliquis: Has some  palpitations at night, I will change Toprol-XL to nighttime dose instead. If continued to have palpitation, we increase Toprol-XL to 150 mg daily wall 100 mg twice a day. Check CBC today.  2. NICM with ejection fraction improved to 40-45%: He is on carvedilol and Imdur, he is not on hydralazine as it is listed as allergy. He apparently has palpitation  before after taking hydralazine, the question really would be if the palpitation he felt before atrial fibrillation. As a trial we have decided to restart on the hydralazine.  3. Chronic systolic heart failure: He appears to be euvolemic on physical exam at this point.  4. Hypertension: There does appears to be some degree of reversible restrictive physiology on echocardiogram, also has grade 3 diastolic dysfunction, his blood pressure has been chronically elevated, we added hydralazine 25 mg 3 times a day for LV dysfunction but also to help with blood pressure control.  5. Hyperlipidemia: Although this is listed in the past medical history, patient has not had any lipid panel taken nor has he been taking a statin medication. Consider recheck of follow-up.    Medication Adjustments/Labs and Tests Ordered: Current medicines are reviewed at length with the patient today.  Concerns regarding medicines are outlined above.  Medication changes, Labs and Tests ordered today are listed in the Patient Instructions below. Patient Instructions  Medication Instructions:  START- Hydralazine 25 mg three times a day               Take Metoprolol at night instead of in the day  Labwork: CBC Today  Testing/Procedures: None Ordered  Follow-Up: Keep appointment with Dr [name] on December 7th at 8:15 am  Any Other Special Instructions Will Be Listed Below (If Applicable).   If you need a refill on your cardiac medications before your next appointment, please call your pharmacy.      Hilbert Corrigan, [place]  07/28/2016 10:42 PM    [facility] [address], [place], [place]  [address] Phone: [phone]; Fax: [phone]

## 2016-07-28 NOTE — Patient Instructions (Addendum)
Medication Instructions:  START- Hydralazine 25 mg three times a day               Take Metoprolol at night instead of in the day  Labwork: CBC Today  Testing/Procedures: None Ordered  Follow-Up: Keep appointment with Dr [name] on December 7th at 8:15 am  Any Other Special Instructions Will Be Listed Below (If Applicable).   If you need a refill on your cardiac medications before your next appointment, please call your pharmacy.

## 2016-08-25 NOTE — Progress Notes (Signed)
Cardiology Office Note    Date:  08/26/2016   ID:  Dennis Lewis, DOB 01/30/1956, MRN [id number]  PCP:  Dennis Redwood, MD  Cardiologist:  Dr. [name]  Chief Complaint  Patient presents with  . Congestive Heart Failure  . Atrial Fibrillation    History of Present Illness:  Dennis Lewis is a 60 y.o. male with PMH of hypertension, GERD, hyperlipidemia, chronic left bundle branch block, history of NSVT, prostate cancer s/p radiation therapy and NICM with EF 35-40%. He had a cardiac cath in 1990s. Most recently, he was admitted for NSTEMI on 06/06/2016, he underwent emergent cardiac catheterization which only showed 30% proximal RCA disease, 25-35% ejection fraction, pattern of apical wall motion abnormality without severe CAD suggestive of Takotsubo cardiomyopathy. Surprisingly, his troponin peaked at 31.36 which is a lot higher than expected for stress-induced cardiomyopathy.  There was some consideration that this may have been an embolic event. Given his allergy to ACE inhibitor, Imdur and hydralazine was added. Initial echo obtained in September showed ejection fraction 25-30%. He was also noted to have new atrial fibrillation/flutter, due to elevated CHA2DS2-Vasc score, he was started on eliquis. Repeat echocardiogram 6 weeks later on 07/20/2016 showed ejection fraction has improved to A999333, grade 3 diastolic dysfunction, paradoxical ventricular septal motion consistent with LBBB.   When seen in November he was still in atrial flutter. Since then he has done very well. Notes occasional mild palpitations when he gets anxious but it is much better than before. No longer waking up with pounding in his chest. Denies chest pain, SOB, dizziness or edema. He has made significant lifestyle modifications with no Etoh in over 2 months. He is exercising regularly with walking and is biking a lot. States he feels the best he has in a long time. Reports BP has been normal at home in 120s.   Past Medical  History:  Diagnosis Date  . Anxiety   . Cardiomyopathy due to hypertension, without heart failure ([place])   . 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 GERD (gastroesophageal reflux disease)   . H/O cardiovascular stress test 2010   [name]  . H/O echocardiogram 2010    mild LVH with some septal dyssynergy, EF 45%, impaired relaxation  . Hyperlipidemia   . Hypertension   . Left bundle branch block   . NSVT (nonsustained ventricular tachycardia) ([place])   . Palpitations   . Prostate cancer ([place]) 04/02/14   Gleason 7, volume 30 gm  . S/P radiation therapy 06/26/2014 through 08/22/2014                                                      Prostate 7800 cGy in 40 sessions                        . Torn tendon    right shoulder    Past Surgical History:  Procedure Laterality Date  . BACK SURGERY  1993  . CARDIAC CATHETERIZATION  1990   Normal cors  . CARDIAC CATHETERIZATION N/A 06/06/2016   Procedure: Left Heart Cath and Coronary Angiography;  Surgeon: Dennis Booze, MD;  Location: [facility];  Service: Cardiovascular;  Laterality: N/A;  . PROSTATE BIOPSY  04/02/14   Gleason 7, vol 30 gm    Current  Medications: Outpatient Medications Prior to Visit  Medication Sig Dispense Refill  . apixaban (ELIQUIS) 5 MG TABS tablet Take 1 tablet (5 mg total) by mouth 2 (two) times daily. 60 tablet 0  . dexlansoprazole (DEXILANT) 60 MG capsule Take 60 mg by mouth daily.    . diazepam (VALIUM) 5 MG tablet Take 1 tablet by mouth daily as needed.    . furosemide (LASIX) 40 MG tablet Take 1 tablet (40 mg total) by mouth daily. 30 tablet 6  . isosorbide mononitrate (IMDUR) 30 MG 24 hr tablet Take 1 tablet (30 mg total) by mouth daily. 30 tablet 6  . metoprolol succinate (TOPROL-XL) 100 MG 24 hr tablet Take 1 tablet (100 mg total) by mouth daily. 30 tablet 9  . nitroGLYCERIN (NITROSTAT) 0.4 MG SL tablet Place 1 tablet under the tongue as needed. X 3 doses    . potassium  chloride (K-DUR,KLOR-CON) 10 MEQ tablet Take 1 tablet (10 mEq total) by mouth 2 (two) times daily. 60 tablet 6  . valACYclovir (VALTREX) 1000 MG tablet Take 1 g by mouth daily as needed (for outbreaks).     . hydrALAZINE (APRESOLINE) 25 MG tablet Take 1 tablet (25 mg total) by mouth 3 (three) times daily. (Patient not taking: Reported on 08/26/2016) 90 tablet 6   No facility-administered medications prior to visit.      Allergies:   Ace inhibitors; Chocolate; Other; Peanut-containing drug products; and Hydralazine   Social History   Social History  . Marital status: Single    Spouse name: N/A  . Number of children: 2  . Years of education: N/A   Occupational History  . Nurse, adult    Social History Main Topics  . Smoking status: Former Smoker    Types: Cigarettes    Quit date: 02/22/1979  . Smokeless tobacco: Never Used  . Alcohol use Yes     Comment: beer daily  . Drug use: No  . Sexual activity: Not Asked   Other Topics Concern  . None   Social History Narrative  . None     Family History:  The patient's family history includes Esophageal cancer in his father; Prostate cancer in his brother; Throat cancer (age of onset: 12) in his father.   ROS:   Please see the history of present illness.    ROS All other systems reviewed and are negative.   PHYSICAL EXAM:   VS:  BP (!) 153/86   Pulse 66   Ht 5\' 11"  (1.803 m)   Wt 191 lb 9.6 oz (86.9 kg)   BMI 26.72 kg/m    GEN: Well nourished, well developed, in no acute distress  HEENT: normal  Neck: no JVD, carotid bruits, or masses Cardiac: Very regular; no murmurs, rubs, or gallops,no edema  Respiratory:  clear to auscultation bilaterally, normal work of breathing GI: soft, nontender, nondistended, + BS MS: no deformity or atrophy  Skin: warm and dry, no rash Neuro:  Alert and Oriented x 3, Strength and sensation are intact Psych: euthymic mood, full affect  Wt Readings from Last 3 Encounters:  08/26/16 191 lb  9.6 oz (86.9 kg)  07/28/16 186 lb 9.6 oz (84.6 kg)  06/08/16 182 lb 8.7 oz (82.8 kg)      Studies/Labs Reviewed:   EKG:  EKG is ordered today.  The ekg ordered today demonstrates Atrial fibrillation, left bundle-branch block.  Recent Labs: 06/06/2016: Magnesium 2.0 06/08/2016: BUN 12; Creatinine, Ser 0.94; Potassium 4.7; Sodium 139 07/28/2016: Hemoglobin 15.3;  Platelets 270   Lipid Panel No results found for: CHOL, TRIG, HDL, CHOLHDL, VLDL, LDLCALC, LDLDIRECT   Lipid panel from 02/12/16: cholesterol 224, triglycerides 128, LDL 141, HDL 57.  Additional studies/ records that were reviewed today include:   Cath 06/06/2016 Conclusion     Prox RCA lesion, 30 %stenosed at a bend.  The left ventricular ejection fraction is 25-35% by visual estimate. The pattern of apical wall motion abnormality without severe CAD is suggestive of Takotsubo cardiomyopathy.  LV end diastolic pressure is moderately elevated.  There is no aortic valve stenosis.   Continue with aggressive medical therapy. He is allergic to ace inhibitors. Will add isosorbide to his hydralazine. Continue beta blocker. 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 He states that he was lying in bed for about 3 hours very worried about what he would do. He then started to have chest discomfort. The history is also consistent with Takatsubo cardiomyopathy.  May need diuresis given moderately elevated LVEDP.   Echo 06/07/2016 LV EF: 25% -   30%  Study Conclusions  - Left ventricle: The cavity size was normal. There was mild   concentric hypertrophy. Systolic function was severely reduced.   The estimated ejection fraction was in the range of 25% to 30%.   Diffuse hypokinesis with no identifiable regional variations. No   evidence of thrombus. - Left atrium: The atrium was moderately dilated.  Impressions:  - Rhythm appears to be atrial  fibrillation.   Echo 07/20/2016 LV EF: 40% -   45%  - Left ventricle: The cavity size was normal. Systolic function was   mildly to moderately reduced. The estimated ejection fraction was   in the range of 40% to 45%. Diffuse hypokinesis. There was a   reduced contribution of atrial contraction to ventricular   filling, due to increased ventricular diastolic pressure or   atrial contractile dysfunction. Doppler parameters are consistent   with a reversible restrictive pattern, indicative of decreased   left ventricular diastolic compliance and/or increased left   atrial pressure (grade 3 diastolic dysfunction). - Ventricular septum: Septal motion showed moderate paradox. These   changes are consistent with intraventricular conduction delay. - Left atrium: The atrium was mildly dilated. - Pulmonary arteries: PA peak pressure: 40 mm Hg (S).  Impressions:  - The right ventricular systolic pressure was increased consistent   with mild pulmonary hypertension.    ASSESSMENT:    1. Congestive dilated cardiomyopathy ([place])   2. Essential hypertension   3. Left bundle branch block   4. Typical atrial flutter ([facility])      PLAN:  In order of problems listed above:  1. Atrial flutter paroxysmal.  on eliquis: Palpitations are much better and by exam today he is in NSR. Continue Toprol. I think cessation of Etoh has been of greatest benefit.   2. NICM with ejection fraction improved to 40-45%: He is on carvedilol and Imdur, he is not on hydralazine due to intolerable side effects. Continue other therapy  3. Chronic systolic heart failure: He appears to be euvolemic on physical exam at this point.  4. Hypertension: BP is mildly elevated here today but has been normal at home. Continue current therapy and monitor.  5. Hyperlipidemia: dietary modification for now. Consider repeat fasting lab in 6 months.    Medication Adjustments/Labs and Tests Ordered: Current medicines are  reviewed at length with the patient today.  Concerns regarding medicines are outlined above.  Medication changes, Labs and Tests ordered today are listed in the Patient Instructions below. Patient Instructions  Continue your current therapy  I will see you in 6 months.    Signed, Peter [name], MD  08/26/2016 8:25 AM    [facility] [address], [place], [place]  [address] Phone: [phone]; Fax: [phone]

## 2016-08-26 ENCOUNTER — Ambulatory Visit (INDEPENDENT_AMBULATORY_CARE_PROVIDER_SITE_OTHER): Payer: Managed Care, Other (non HMO) | Admitting: Cardiology

## 2016-08-26 ENCOUNTER — Encounter: Payer: Self-pay | Admitting: Cardiology

## 2016-08-26 ENCOUNTER — Other Ambulatory Visit: Payer: Self-pay

## 2016-08-26 VITALS — BP 153/86 | HR 66 | Ht 71.0 in | Wt 191.6 lb

## 2016-08-26 DIAGNOSIS — I420 Dilated cardiomyopathy: Secondary | ICD-10-CM | POA: Diagnosis not present

## 2016-08-26 DIAGNOSIS — I483 Typical atrial flutter: Secondary | ICD-10-CM | POA: Diagnosis not present

## 2016-08-26 DIAGNOSIS — I447 Left bundle-branch block, unspecified: Secondary | ICD-10-CM | POA: Diagnosis not present

## 2016-08-26 DIAGNOSIS — R002 Palpitations: Secondary | ICD-10-CM

## 2016-08-26 DIAGNOSIS — I10 Essential (primary) hypertension: Secondary | ICD-10-CM

## 2016-08-26 DIAGNOSIS — E785 Hyperlipidemia, unspecified: Secondary | ICD-10-CM

## 2016-08-26 DIAGNOSIS — I4892 Unspecified atrial flutter: Secondary | ICD-10-CM | POA: Insufficient documentation

## 2016-08-26 NOTE — Patient Instructions (Signed)
Continue your current therapy  I will see you in 6 months.   

## 2016-11-04 ENCOUNTER — Encounter (HOSPITAL_COMMUNITY): Payer: Self-pay | Admitting: Internal Medicine

## 2016-11-04 NOTE — Progress Notes (Signed)
Mailed letter with [facility] to pt ... KJ  °

## 2016-12-29 ENCOUNTER — Other Ambulatory Visit: Payer: Self-pay | Admitting: Cardiology

## 2016-12-31 NOTE — Telephone Encounter (Signed)
REFILL 

## 2017-04-01 IMAGING — NM NM MISC PROCEDURE
6 series · 36 of 36 positions shown · non-contrast
Comparison: none

[Series 1: wbr rest · 6.40mm/px · 6 of 64 frames shown]
[frame 6/64]
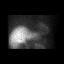
[frame 16/64]
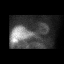
[frame 27/64]
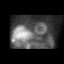
[frame 38/64]
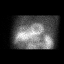
[frame 48/64]
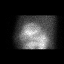
[frame 59/64]
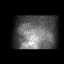

[Series 1: wbr_r-proj_st wbr rest · 6.40mm/px · 6 of 64 frames shown]
[frame 6/64]
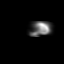
[frame 16/64]
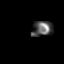
[frame 27/64]
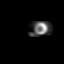
[frame 38/64]
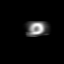
[frame 48/64]
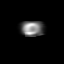
[frame 59/64]
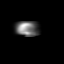

[Series 2: wbr stress-gsp · 6.40mm/px · 6 of 512 frames shown]
[frame 43/512]
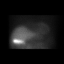
[frame 128/512]
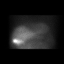
[frame 214/512]
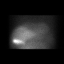
[frame 299/512]
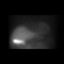
[frame 384/512]
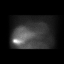
[frame 470/512]
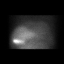

[Series 2: wbr_s-proj_st wbr stress-gsp · 6.40mm/px · 6 of 512 frames shown]
[frame 43/512]
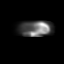
[frame 128/512]
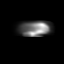
[frame 214/512]
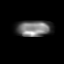
[frame 299/512]
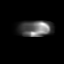
[frame 384/512]
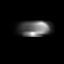
[frame 470/512]
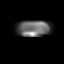

[Series 3: wbr stress-sum-em · 6.40mm/px · 6 of 64 frames shown]
[frame 6/64]
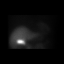
[frame 16/64]
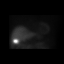
[frame 27/64]
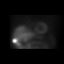
[frame 38/64]
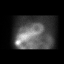
[frame 48/64]
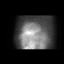
[frame 59/64]
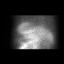

[Series 3: wbr_s-proj_st wbr stress-sum-em · 6.40mm/px · 6 of 64 frames shown]
[frame 6/64]
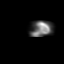
[frame 16/64]
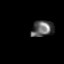
[frame 27/64]
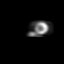
[frame 38/64]
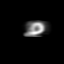
[frame 48/64]
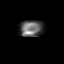
[frame 59/64]
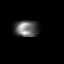

[36 of 36 positions shown; findings below may reference images not displayed]

Canned report from images found in remote index.

Refer to host system for actual result text.

## 2017-06-09 ENCOUNTER — Other Ambulatory Visit: Payer: Self-pay | Admitting: Cardiology

## 2017-07-13 ENCOUNTER — Ambulatory Visit: Payer: Managed Care, Other (non HMO) | Admitting: Family Medicine

## 2017-07-24 ENCOUNTER — Other Ambulatory Visit: Payer: Self-pay | Admitting: Cardiology

## 2017-08-03 ENCOUNTER — Ambulatory Visit (INDEPENDENT_AMBULATORY_CARE_PROVIDER_SITE_OTHER): Payer: 59 | Admitting: Family Medicine

## 2017-08-03 ENCOUNTER — Encounter: Payer: Self-pay | Admitting: Family Medicine

## 2017-08-03 VITALS — BP 138/90 | HR 77 | Ht 71.0 in | Wt 195.0 lb

## 2017-08-03 DIAGNOSIS — Z91199 Patient's noncompliance with other medical treatment and regimen due to unspecified reason: Secondary | ICD-10-CM

## 2017-08-03 DIAGNOSIS — Z5329 Procedure and treatment not carried out because of patient's decision for other reasons: Secondary | ICD-10-CM

## 2017-08-03 NOTE — Progress Notes (Signed)
Patient did not have new patient forms completed, patient will complete and reschedule. MPulliam, CMA/RT(R)

## 2017-08-09 ENCOUNTER — Telehealth: Payer: Self-pay | Admitting: *Deleted

## 2017-08-09 NOTE — Telephone Encounter (Signed)
Request for surgical clearance:  1. What type of surgery is being performed? Rt elbow EUA, olecranon spur excision, triceps tenolysis and repair and ulna nerve decompression  2. When is this surgery scheduled? 09/30/2017  3. Are there any medications that need to be held prior to surgery and how long? Eliquis - need instructions  4. Name of physician performing surgery? Grier Mitts, MD  5. What is your office phone and fax number? Ph: [phone]  fax: [phone]  ATTN: Orson Slick

## 2017-08-10 ENCOUNTER — Telehealth: Payer: Self-pay | Admitting: Physician Assistant

## 2017-08-10 NOTE — Telephone Encounter (Signed)
Follow Up:       Wife would like for you to call her please.

## 2017-08-10 NOTE — Telephone Encounter (Signed)
    Chart reviewed as part of pre-operative protocol coverage. Because of Dennis Lewis's past medical history and time since last visit, he/she will require a follow-up visit in order to better assess preoperative cardiovascular risk. Their primary cardiologist is Dr. [name].  The patient was last seen in 08/2016 with recommendation to follow-up in 6 months, which has not yet occurred. Do not see any upcoming appointments scheduled.  Pre-op covering staff: - Please schedule appointment and call patient to inform them. - Please contact requesting surgeon's office via preferred method (i.e, phone, fax) to inform them of need for appointment prior to surgery.  Charlie Pitter, PA-C  08/10/2017, 1:50 PM

## 2017-08-10 NOTE — Telephone Encounter (Signed)
Returned Cecille Rubin, pts' wife, DPR on file, call.  She has been explained why pt needed an appt before he can be cleared for his surgery. She thanked me for the call back.

## 2017-08-10 NOTE — Telephone Encounter (Signed)
Contacted pt re: cardiac clearance for elbow procedure. Pt understands that he needs an appt and has been scheduled to see Almyra Deforest, PA-C, 08/16/17. Pt thanked me for the help.

## 2017-08-16 ENCOUNTER — Ambulatory Visit (INDEPENDENT_AMBULATORY_CARE_PROVIDER_SITE_OTHER): Payer: 59 | Admitting: Physician Assistant

## 2017-08-16 ENCOUNTER — Encounter: Payer: Self-pay | Admitting: Physician Assistant

## 2017-08-16 VITALS — BP 122/84 | HR 74 | Ht 69.0 in | Wt 177.0 lb

## 2017-08-16 DIAGNOSIS — Z01810 Encounter for preprocedural cardiovascular examination: Secondary | ICD-10-CM

## 2017-08-16 DIAGNOSIS — E785 Hyperlipidemia, unspecified: Secondary | ICD-10-CM

## 2017-08-16 DIAGNOSIS — I447 Left bundle-branch block, unspecified: Secondary | ICD-10-CM | POA: Diagnosis not present

## 2017-08-16 DIAGNOSIS — I4892 Unspecified atrial flutter: Secondary | ICD-10-CM

## 2017-08-16 DIAGNOSIS — I428 Other cardiomyopathies: Secondary | ICD-10-CM

## 2017-08-16 DIAGNOSIS — I10 Essential (primary) hypertension: Secondary | ICD-10-CM

## 2017-08-16 NOTE — Patient Instructions (Signed)
Almyra Deforest, PA-C recommends that you continue on your current medications as directed. Please refer to the Current Medication list given to you today.  Your physician has requested that you have an echocardiogram. Echocardiography is a painless test that uses sound waves to create images of your heart. It provides your doctor with information about the size and shape of your heart and how well your heart's chambers and valves are working. This procedure takes approximately one hour. There are no restrictions for this procedure. >>This will be performed at our [facility] location - [address] [place], [place] recommends that you schedule a follow-up appointment in 6 months with Dr [name]. You will receive a reminder letter in the mail two months in advance. If you don't receive a letter, please call our office to schedule the follow-up appointment.  If you need a refill on your cardiac medications before your next appointment, please call your pharmacy.

## 2017-08-16 NOTE — Progress Notes (Signed)
Cardiology Office Note    Date:  08/17/2017   ID:  Dennis Lewis, DOB Mar 06, 1956, MRN [id number]  PCP:  [name], MD  Cardiologist:  Dr. [name]   Chief Complaint  Patient presents with  . Follow-up    preop clearance requested for R elbow surgery by Dr. Roseanne Kaufman   Preoperative clearance requested for right elbow surgery by Dr. Roseanne Kaufman   History of Present Illness:  Dennis Lewis is a 61 y.o. male with PMH of hypertension, GERD, hyperlipidemia, chronic LBBB, history of NSVT, prostate cancer s/p radiation therapy, persistent atrial flutter, and NICM with EF 35-40%. He had a cardiac cath in 1990s. Most recently, he was admitted for NSTEMI on 06/06/2016, he underwent emergent cardiac catheterization which only showed 30% proximal RCA disease, 25-35% ejection fraction, pattern of apical wall motion abnormality without severe CAD suggestive of Takotsubo cardiomyopathy. Surprisingly, his troponin peaked at 31.36 which is a lot higher than expected for stress-induced cardiomyopathy. Retrospectively, it is possible that he had an embolic MI. Given his allergy to ACE inhibitor, Imdur and hydralazine was added. Initial echo obtained in September showed ejection fraction 25-30%. He was also noted to have new atrial fibrillation, due to elevated CHA2DS2-Vasc score, he was started on eliquis. Repeat echocardiogram 6 weeks later on 07/20/2016 showed ejection fraction has improved to 76-72%, grade 3 diastolic dysfunction, paradoxical ventricular septal motion consistent with LBBB.   Patient presents today for preoperative clearance prior to right elbow and upper arm procedure.  He denies any chest pain, however occasionally still have some degree of shortness of breath.  His persistent atrial flutter is well rate controlled.  He does not have any lower extremity edema, orthopnea or paroxysmal nocturnal dyspnea.  I plan to obtain a repeat echocardiogram, if EF is stable, he would be a low risk  patient to proceed with surgery.   Past Medical History:  Diagnosis Date  . Anxiety   . Cardiomyopathy due to hypertension, without heart failure ([place])   . Congestive dilated cardiomyopathy ([place]) 04/23/2016  . GERD (gastroesophageal reflux disease)   . H/O cardiovascular stress test 2010   [name]  . H/O echocardiogram 2010    mild LVH with some septal dyssynergy, EF 45%, impaired relaxation  . Hyperlipidemia   . Hypertension   . Left bundle branch block   . NSVT (nonsustained ventricular tachycardia) ([place])   . Palpitations   . Prostate cancer ([place]) 04/02/14   Gleason 7, volume 30 gm  . S/P radiation therapy 06/26/2014 through 08/22/2014                                                      Prostate 7800 cGy in 40 sessions                        . Torn tendon    right shoulder    Past Surgical History:  Procedure Laterality Date  . BACK SURGERY  1993  . CARDIAC CATHETERIZATION  1990   Normal cors  . CARDIAC CATHETERIZATION N/A 06/06/2016   Procedure: Left Heart Cath and Coronary Angiography;  Surgeon: Jettie Booze, MD;  Location: [facility];  Service: Cardiovascular;  Laterality: N/A;  . PROSTATE BIOPSY  04/02/14   Gleason 7, vol 30 gm  Current Medications: Outpatient Medications Prior to Visit  Medication Sig Dispense Refill  . diazepam (VALIUM) 5 MG tablet Take 1 tablet by mouth daily as needed.    Marland Kitchen ELIQUIS 5 MG TABS tablet TAKE 1 TABLET BY MOUTH TWICE A DAY 180 tablet 1  . furosemide (LASIX) 20 MG tablet TAKE 2 TABLETS (40 MG TOTAL) BY MOUTH DAILY. 60 tablet 6  . isosorbide mononitrate (IMDUR) 30 MG 24 hr tablet TAKE 1 TABLET BY MOUTH EVERY DAY 30 tablet 6  . KLOR-CON 10 10 MEQ tablet TAKE 1 TABLET BY MOUTH TWICE A DAY 60 tablet 6  . metoprolol succinate (TOPROL-XL) 100 MG 24 hr tablet Take 1 tablet (100 mg total) by mouth daily. 30 tablet 9  . nitroGLYCERIN (NITROSTAT) 0.4 MG SL tablet Place 1 tablet under the tongue as needed. X 3 doses    .  valACYclovir (VALTREX) 1000 MG tablet Take 1 g by mouth daily as needed (for outbreaks).     Marland Kitchen dexlansoprazole (DEXILANT) 60 MG capsule Take 60 mg by mouth daily.     No facility-administered medications prior to visit.      Allergies:   Ace inhibitors; Chocolate; Other; Peanut-containing drug products; and Hydralazine   Social History   Socioeconomic History  . Marital status: Single    Spouse name: None  . Number of children: 2  . Years of education: None  . Highest education level: None  Social Needs  . Financial resource strain: None  . Food insecurity - worry: None  . Food insecurity - inability: None  . Transportation needs - medical: None  . Transportation needs - non-medical: None  Occupational History  . Occupation: Nurse, adult  Tobacco Use  . Smoking status: Former Smoker    Types: Cigarettes    Last attempt to quit: 02/22/1979    Years since quitting: 38.5  . Smokeless tobacco: Never Used  Substance and Sexual Activity  . Alcohol use: Yes    Comment: beer daily  . Drug use: No  . Sexual activity: None  Other Topics Concern  . None  Social History Narrative  . None     Family History:  The patient's family history includes Esophageal cancer in his father; Prostate cancer in his brother; Throat cancer (age of onset: 59) in his father.   ROS:   Please see the history of present illness.    ROS All other systems reviewed and are negative.   PHYSICAL EXAM:   VS:  BP 122/84   Pulse 74   Ht 5\' 9"  (1.753 m)   Wt 177 lb (80.3 kg)   BMI 26.14 kg/m    GEN: Well nourished, well developed, in no acute distress  HEENT: normal  Neck: no JVD, carotid bruits, or masses Cardiac: irregularly irregular; no murmurs, rubs, or gallops,no edema  Respiratory:  clear to auscultation bilaterally, normal work of breathing GI: soft, nontender, nondistended, + BS MS: no deformity or atrophy  Skin: warm and dry, no rash Neuro:  Alert and Oriented x 3, Strength and  sensation are intact Psych: euthymic mood, full affect  Wt Readings from Last 3 Encounters:  08/16/17 177 lb (80.3 kg)  08/03/17 195 lb (88.5 kg)  08/26/16 191 lb 9.6 oz (86.9 kg)      Studies/Labs Reviewed:   EKG:  EKG is ordered today.  The ekg ordered today demonstrates atrial fibrillation, left bundle branch block.  Recent Labs: No results found for requested labs within last 8760 hours.  Lipid Panel No results found for: CHOL, TRIG, HDL, CHOLHDL, VLDL, LDLCALC, LDLDIRECT  Additional studies/ records that were reviewed today include:    Cath 06/06/2016 Conclusion     Prox RCA lesion, 30 %stenosed at a bend.  The left ventricular ejection fraction is 25-35% by visual estimate. The pattern of apical wall motion abnormality without severe CAD is suggestive of Takotsubo cardiomyopathy.  LV end diastolic pressure is moderately elevated.  There is no aortic valve stenosis.   Continue with aggressive medical therapy. He is allergic to ace inhibitors. Will add isosorbide to his hydralazine. Continue beta blocker. 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 He states that he was lying in bed for about 3 hours very worried about what he would do. He then started to have chest discomfort. The history is also consistent with Takatsubo cardiomyopathy.  May need diuresis given moderately elevated LVEDP.      Echo 07/20/2016 LV EF: 40% -   45%  Study Conclusions  - Left ventricle: The cavity size was normal. Systolic function was   mildly to moderately reduced. The estimated ejection fraction was   in the range of 40% to 45%. Diffuse hypokinesis. There was a   reduced contribution of atrial contraction to ventricular   filling, due to increased ventricular diastolic pressure or   atrial contractile dysfunction. Doppler parameters are consistent   with a reversible restrictive pattern, indicative of  decreased   left ventricular diastolic compliance and/or increased left   atrial pressure (grade 3 diastolic dysfunction). - Ventricular septum: Septal motion showed moderate paradox. These   changes are consistent with intraventricular conduction delay. - Left atrium: The atrium was mildly dilated. - Pulmonary arteries: PA peak pressure: 40 mm Hg (S).  Impressions:  - The right ventricular systolic pressure was increased consistent   with mild pulmonary hypertension.    ASSESSMENT:    1. Preop cardiovascular exam   2. Nonischemic cardiomyopathy ([place])   3. Atrial flutter, unspecified type ([place])   4. Essential hypertension   5. Hyperlipidemia, unspecified hyperlipidemia type   6. LBBB (left bundle branch block)      PLAN:  In order of problems listed above:  1. Preoperative clearance: requested for right elbow surgery by Dr. Roseanne Kaufman.  Plan to obtain repeat echocardiogram, if EF is stable or improved, no further workup is needed.  2. NICM: Reviewed his cardiac catheterization only showed 30% RCA disease, repeat echocardiogram obtained on 07/20/2016 showed EF improved to 40-45%.  Plan for repeat echocardiogram  3. Chronic atrial flutter: on eliquis 5mg  BID. CHA2DS2-Vasc score 2 (HTN, HF).  Well rate controlled on Toprol-XL  4. Hypertension: Blood pressure well controlled  5. Hyperlipidemia: Not on a statin.  Annual lipid panel obtained by primary care provider.    Medication Adjustments/Labs and Tests Ordered: Current medicines are reviewed at length with the patient today.  Concerns regarding medicines are outlined above.  Medication changes, Labs and Tests ordered today are listed in the Patient Instructions below. Patient Instructions  Almyra Deforest, PA-C recommends that you continue on your current medications as directed. Please refer to the Current Medication list given to you today.  Your physician has requested that you have an echocardiogram. Echocardiography  is a painless test that uses sound waves to create images of your heart. It provides your doctor with information about the size and shape of your heart and how well your heart's chambers and valves are working.  This procedure takes approximately one hour. There are no restrictions for this procedure. >>This will be performed at our [facility] location - [address], [place], [place] recommends that you schedule a follow-up appointment in 6 months with Dr [name]. You will receive a reminder letter in the mail two months in advance. If you don't receive a letter, please call our office to schedule the follow-up appointment.  If you need a refill on your cardiac medications before your next appointment, please call your pharmacy.    Hilbert Corrigan, [place]  08/17/2017 11:57 PM    [facility] [place], [place], [place]  [address] Phone: [phone]; Fax: [phone]

## 2017-08-17 ENCOUNTER — Encounter: Payer: Self-pay | Admitting: Physician Assistant

## 2017-09-15 ENCOUNTER — Other Ambulatory Visit (HOSPITAL_COMMUNITY): Payer: 59

## 2017-10-04 ENCOUNTER — Other Ambulatory Visit (HOSPITAL_COMMUNITY): Payer: 59

## 2017-10-24 ENCOUNTER — Other Ambulatory Visit (HOSPITAL_COMMUNITY): Payer: 59

## 2017-12-06 ENCOUNTER — Other Ambulatory Visit: Payer: Self-pay | Admitting: Cardiology

## 2017-12-09 ENCOUNTER — Other Ambulatory Visit: Payer: Self-pay

## 2017-12-09 MED ORDER — ISOSORBIDE MONONITRATE ER 30 MG PO TB24: 30.0000 mg | ORAL_TABLET | Freq: Every day | ORAL | 1 refills | Status: DC

## 2017-12-16 ENCOUNTER — Other Ambulatory Visit (HOSPITAL_COMMUNITY): Payer: BLUE CROSS/BLUE SHIELD

## 2017-12-16 DIAGNOSIS — R0989 Other specified symptoms and signs involving the circulatory and respiratory systems: Secondary | ICD-10-CM

## 2017-12-19 IMAGING — CR DG CHEST 1V PORT
1 series · 1 of 1 positions shown · non-contrast
Comparison: None.

CLINICAL DATA: Epigastric pain starting around 9 a.m. today.
Nonsmoker.

EXAM:
PORTABLE CHEST 1 VIEW

[AP]
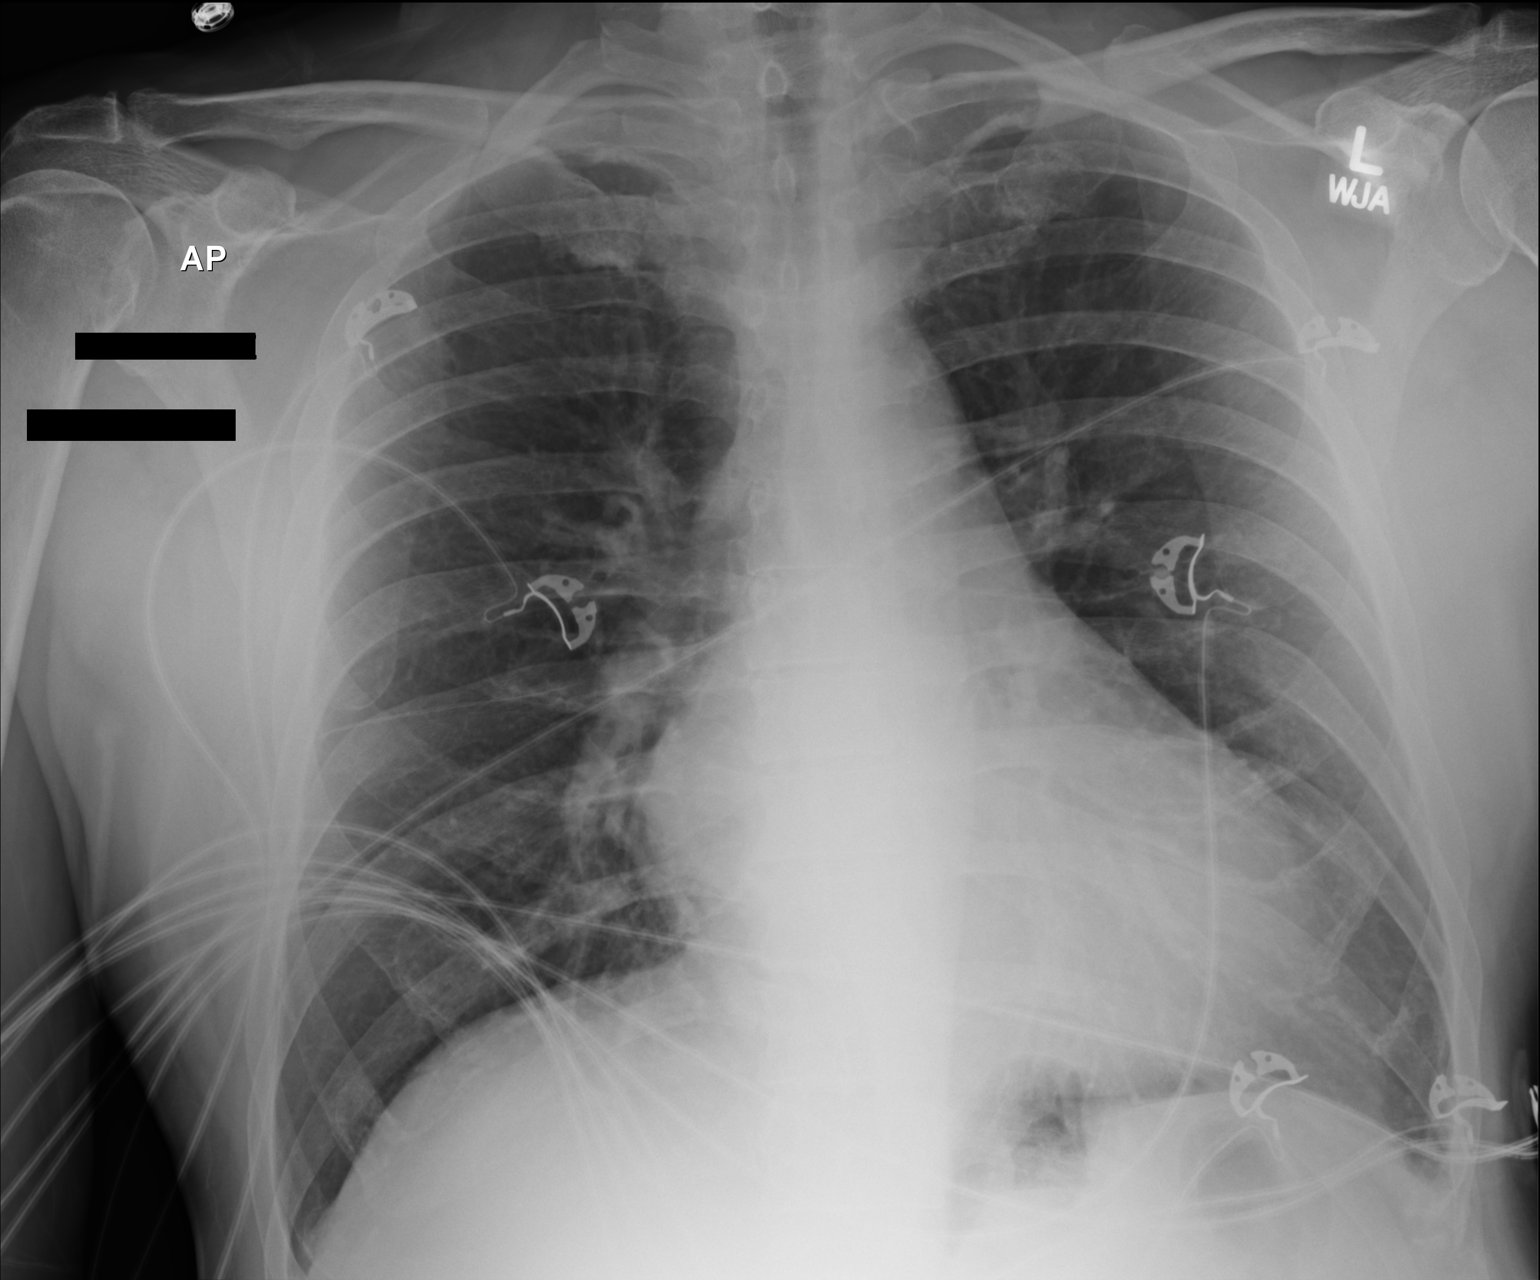

[1 of 1 positions shown; findings below may reference images not displayed]

FINDINGS: The heart size and mediastinal contours are within normal limits.
Both lungs are clear. The visualized skeletal structures are
unremarkable.
IMPRESSION: No active disease.

## 2018-01-10 DIAGNOSIS — Z8546 Personal history of malignant neoplasm of prostate: Secondary | ICD-10-CM | POA: Diagnosis not present

## 2018-01-13 ENCOUNTER — Other Ambulatory Visit (HOSPITAL_COMMUNITY): Payer: BLUE CROSS/BLUE SHIELD

## 2018-01-19 DIAGNOSIS — N5201 Erectile dysfunction due to arterial insufficiency: Secondary | ICD-10-CM | POA: Diagnosis not present

## 2018-01-19 DIAGNOSIS — C61 Malignant neoplasm of prostate: Secondary | ICD-10-CM | POA: Diagnosis not present

## 2018-01-20 ENCOUNTER — Telehealth: Payer: Self-pay

## 2018-01-20 NOTE — Telephone Encounter (Signed)
Pt takes Eliquis for afib with CHADS2VASc score of 3 (HTN, HF, CAD). Scr normal at 1 when checked at PCP in May 2018. Ok to hold Eliquis for 2-3 days prior to procedure as needed.

## 2018-01-20 NOTE — Telephone Encounter (Signed)
Pt saw Dennis Lewis, [place] in Nov 2018 for this surgery and to clear the pt we need repeat echo, pt has not yet had the echo.  It is scheduled for the 16 th now.  Please move this echo up to 5/7 or 8 --we will not clear until this is done.

## 2018-01-20 NOTE — Telephone Encounter (Signed)
Pt is scheduled for echo 02/02/18. Surgery scheduled for 03/03/18. Cecilie Kicks, NP thought surgery was 5/14 and asked for echo to be moved up. I explained surgery was not until 03/03/18. Per Cecilie Kicks, NP no need to move echo appt up. Per Cecilie Kicks, NP to forward pre op information to [facility], [place] as to he had ordered the echo. I will remove this from the pre op call back pool.

## 2018-01-20 NOTE — Telephone Encounter (Signed)
Will check with pharmacy as well.

## 2018-01-20 NOTE — Telephone Encounter (Signed)
Request for surgical clearance:  1. What type of surgery is being performed? Right elbow EUA,olecranon spur excision, triceps   2. When is this surgery scheduled? 03-03-18   3. What type of clearance is required (medical clearance vs. Pharmacy clearance to hold med vs. Both)? MEDICAL  4. Are there any medications that need to be held prior to surgery and how long?NON LISTED-BUT PT TAKES ELIQUIS   5. Practice name and name of physician performing surgery?  [facility]   6. What is your office phone number [phone] fx [phone]    7.  Anesthesia type (None, local, MAC, general):  SPINAL

## 2018-01-23 NOTE — Telephone Encounter (Signed)
I will followup on the echo result and clear the patient if it is good.   Dennis Lewis

## 2018-01-27 DIAGNOSIS — X32XXXA Exposure to sunlight, initial encounter: Secondary | ICD-10-CM | POA: Diagnosis not present

## 2018-01-27 DIAGNOSIS — C44319 Basal cell carcinoma of skin of other parts of face: Secondary | ICD-10-CM | POA: Diagnosis not present

## 2018-01-27 DIAGNOSIS — L570 Actinic keratosis: Secondary | ICD-10-CM | POA: Diagnosis not present

## 2018-01-27 NOTE — Telephone Encounter (Signed)
   Primary Cardiologist:Peter [name], MD  Chart reviewed as part of pre-operative protocol coverage. The patient was previously seen by Almyra Deforest PA-C in 07/2017 for pre-op evaluation at which time echo was recommended. This is still pending as the patient had not completed. It was rescheduled to 02/02/18. Almyra Deforest replied to the pool that he plans to f/u echo and will address clearance himself. Will send this note to [facility] via [employer] function as FYI that final recommendations are forthcoming from [place].   Charlie Pitter, PA-C 01/27/2018, 1:28 PM

## 2018-01-30 ENCOUNTER — Other Ambulatory Visit: Payer: Self-pay | Admitting: Cardiology

## 2018-02-02 ENCOUNTER — Other Ambulatory Visit (HOSPITAL_COMMUNITY): Payer: BLUE CROSS/BLUE SHIELD

## 2018-02-03 NOTE — Telephone Encounter (Signed)
Following up with echo report, it appears echo has been cancelled and rescheduled again for the 6th time.

## 2018-02-15 ENCOUNTER — Ambulatory Visit: Payer: BLUE CROSS/BLUE SHIELD | Admitting: Family Medicine

## 2018-03-05 ENCOUNTER — Other Ambulatory Visit: Payer: Self-pay | Admitting: Cardiology

## 2018-03-06 ENCOUNTER — Other Ambulatory Visit (HOSPITAL_COMMUNITY): Payer: BLUE CROSS/BLUE SHIELD

## 2018-03-06 ENCOUNTER — Other Ambulatory Visit: Payer: Self-pay | Admitting: Internal Medicine

## 2018-03-06 DIAGNOSIS — I429 Cardiomyopathy, unspecified: Secondary | ICD-10-CM

## 2018-03-28 ENCOUNTER — Ambulatory Visit: Payer: BLUE CROSS/BLUE SHIELD | Admitting: Family Medicine

## 2018-04-28 ENCOUNTER — Ambulatory Visit: Payer: BLUE CROSS/BLUE SHIELD | Admitting: Family Medicine

## 2018-05-01 ENCOUNTER — Other Ambulatory Visit: Payer: Self-pay | Admitting: Cardiology

## 2018-05-24 DIAGNOSIS — M7021 Olecranon bursitis, right elbow: Secondary | ICD-10-CM | POA: Diagnosis not present

## 2018-05-24 DIAGNOSIS — M25521 Pain in right elbow: Secondary | ICD-10-CM | POA: Diagnosis not present

## 2018-05-31 ENCOUNTER — Other Ambulatory Visit: Payer: Self-pay

## 2018-05-31 ENCOUNTER — Ambulatory Visit (HOSPITAL_COMMUNITY): Payer: BLUE CROSS/BLUE SHIELD | Attending: Cardiovascular Disease

## 2018-05-31 DIAGNOSIS — I119 Hypertensive heart disease without heart failure: Secondary | ICD-10-CM | POA: Insufficient documentation

## 2018-05-31 DIAGNOSIS — I472 Ventricular tachycardia: Secondary | ICD-10-CM | POA: Insufficient documentation

## 2018-05-31 DIAGNOSIS — I429 Cardiomyopathy, unspecified: Secondary | ICD-10-CM | POA: Insufficient documentation

## 2018-05-31 DIAGNOSIS — I4892 Unspecified atrial flutter: Secondary | ICD-10-CM | POA: Insufficient documentation

## 2018-05-31 DIAGNOSIS — I340 Nonrheumatic mitral (valve) insufficiency: Secondary | ICD-10-CM | POA: Diagnosis not present

## 2018-05-31 DIAGNOSIS — R002 Palpitations: Secondary | ICD-10-CM | POA: Insufficient documentation

## 2018-05-31 DIAGNOSIS — E785 Hyperlipidemia, unspecified: Secondary | ICD-10-CM | POA: Diagnosis not present

## 2018-06-07 ENCOUNTER — Encounter: Payer: Self-pay | Admitting: Family Medicine

## 2018-06-07 ENCOUNTER — Ambulatory Visit (INDEPENDENT_AMBULATORY_CARE_PROVIDER_SITE_OTHER): Payer: BLUE CROSS/BLUE SHIELD | Admitting: Family Medicine

## 2018-06-07 VITALS — BP 163/96 | HR 69 | Ht 69.0 in | Wt 195.0 lb

## 2018-06-07 DIAGNOSIS — C61 Malignant neoplasm of prostate: Secondary | ICD-10-CM | POA: Diagnosis not present

## 2018-06-07 DIAGNOSIS — I4892 Unspecified atrial flutter: Secondary | ICD-10-CM

## 2018-06-07 DIAGNOSIS — Z7689 Persons encountering health services in other specified circumstances: Secondary | ICD-10-CM

## 2018-06-07 DIAGNOSIS — Z789 Other specified health status: Secondary | ICD-10-CM

## 2018-06-07 DIAGNOSIS — Z923 Personal history of irradiation: Secondary | ICD-10-CM | POA: Insufficient documentation

## 2018-06-07 DIAGNOSIS — I119 Hypertensive heart disease without heart failure: Secondary | ICD-10-CM

## 2018-06-07 DIAGNOSIS — I420 Dilated cardiomyopathy: Secondary | ICD-10-CM | POA: Diagnosis not present

## 2018-06-07 DIAGNOSIS — K219 Gastro-esophageal reflux disease without esophagitis: Secondary | ICD-10-CM | POA: Insufficient documentation

## 2018-06-07 DIAGNOSIS — E785 Hyperlipidemia, unspecified: Secondary | ICD-10-CM | POA: Insufficient documentation

## 2018-06-07 NOTE — Patient Instructions (Addendum)
Please follow-up with cardiology in the very near future regarding your recent echocardiogram results.  You need to ask them whether or not you will need a medication change based on your last echo, and what your goal blood pressure should be.  Please make sure you are checking your blood pressures at home at least 3 times per week after sitting quietly for 15 to 20 minutes and then bring that those blood pressures into the cardiologist office as well as every time you come see me.  Until you are seen by cardiology and told something different, try to get your blood pressure in the 130s over 80s or less  We will get your medical records from [facility] however since you are due for a physical exam in the near future I want to make an appointment a yearly physical and come in for fasting blood work    Please realize, EXERCISE IS MEDICINE!  -  American Heart Association ( AHA) guidelines for exercise : If you are in good health, without any medical conditions, you should engage in 150 minutes of moderate intensity aerobic activity per week.  This means you should be huffing and puffing throughout your workout.   Engaging in regular exercise will improve brain function and memory, as well as improve mood, boost immune system and help with weight management.  As well as the other, more well-known effects of exercise such as decreasing blood sugar levels, decreasing blood pressure,  and decreasing bad cholesterol levels/ increasing good cholesterol levels.     -  The AHA strongly endorses consumption of a diet that contains a variety of foods from all the food categories with an emphasis on fruits and vegetables; fat-free and low-fat dairy products; cereal and grain products; legumes and nuts; and fish, poultry, and/or extra lean meats.    Excessive food intake, especially of foods high in saturated and trans fats, sugar, and salt, should be avoided.    Adequate water intake of roughly 1/2 of your  weight in pounds, should equal the ounces of water per day you should drink.  So for instance, if you're 200 pounds, that would be 100 ounces of water per day.         Mediterranean Diet  Why follow it? Research shows. . Those who follow the Mediterranean diet have a reduced risk of heart disease  . The diet is associated with a reduced incidence of Parkinson's and Alzheimer's diseases . People following the diet may have longer life expectancies and lower rates of chronic diseases  . The Dietary Guidelines for Americans recommends the Mediterranean diet as an eating plan to promote health and prevent disease  What Is the Mediterranean Diet?  . Healthy eating plan based on typical foods and recipes of Mediterranean-style cooking . The diet is primarily a plant based diet; these foods should make up a majority of meals   Starches - Plant based foods should make up a majority of meals - They are an important sources of vitamins, minerals, energy, antioxidants, and fiber - Choose whole grains, foods high in fiber and minimally processed items  - Typical grain sources include wheat, oats, barley, corn, brown rice, bulgar, farro, millet, polenta, couscous  - Various types of beans include chickpeas, lentils, fava beans, black beans, white beans   Fruits  Veggies - Large quantities of antioxidant rich fruits & veggies; 6 or more servings  - Vegetables can be eaten raw or lightly drizzled with oil and cooked  -  Vegetables common to the traditional Mediterranean Diet include: artichokes, arugula, beets, broccoli, brussel sprouts, cabbage, carrots, celery, collard greens, cucumbers, eggplant, kale, leeks, lemons, lettuce, mushrooms, okra, onions, peas, peppers, potatoes, pumpkin, radishes, rutabaga, shallots, spinach, sweet potatoes, turnips, zucchini - Fruits common to the Mediterranean Diet include: apples, apricots, avocados, cherries, clementines, dates, figs, grapefruits, grapes, melons,  nectarines, oranges, peaches, pears, pomegranates, strawberries, tangerines  Fats - Replace butter and margarine with healthy oils, such as olive oil, canola oil, and tahini  - Limit nuts to no more than a handful a day  - Nuts include walnuts, almonds, pecans, pistachios, pine nuts  - Limit or avoid candied, honey roasted or heavily salted nuts - Olives are central to the [employer] - can be eaten whole or used in a variety of dishes   Meats Protein - Limiting red meat: no more than a few times a month - When eating red meat: choose lean cuts and keep the portion to the size of deck of cards - Eggs: approx. 0 to 4 times a week  - Fish and lean poultry: at least 2 a week  - Healthy protein sources include, chicken, [place], lean beef, lamb - Increase intake of seafood such as tuna, salmon, trout, mackerel, shrimp, scallops - Avoid or limit high fat processed meats such as sausage and bacon  Dairy - Include moderate amounts of low fat dairy products  - Focus on healthy dairy such as fat free yogurt, skim milk, low or reduced fat cheese - Limit dairy products higher in fat such as whole or 2% milk, cheese, ice cream  Alcohol - Moderate amounts of red wine is ok  - No more than 5 oz daily for women (all ages) and men older than age 53  - No more than 10 oz of wine daily for men younger than 66  Other - Limit sweets and other desserts  - Use herbs and spices instead of salt to flavor foods  - Herbs and spices common to the traditional Mediterranean Diet include: basil, bay leaves, chives, cloves, cumin, fennel, garlic, lavender, marjoram, mint, oregano, parsley, pepper, rosemary, sage, savory, sumac, tarragon, thyme   It's not just a diet, it's a lifestyle:  . The Mediterranean diet includes lifestyle factors typical of those in the region  . Foods, drinks and meals are best eaten with others and savored . Daily physical activity is important for overall good health . This could be  strenuous exercise like running and aerobics . This could also be more leisurely activities such as walking, housework, yard-work, or taking the stairs . Moderation is the key; a balanced and healthy diet accommodates most foods and drinks . Consider portion sizes and frequency of consumption of certain foods   Meal Ideas & Options:  . Breakfast:  o Whole wheat toast or whole wheat English muffins with peanut butter & hard boiled egg o Steel cut oats topped with apples & cinnamon and skim milk  o Fresh fruit: banana, strawberries, melon, berries, peaches  o Smoothies: strawberries, bananas, greek yogurt, peanut butter o Low fat greek yogurt with blueberries and granola  o Egg white omelet with spinach and mushrooms o Breakfast couscous: whole wheat couscous, apricots, skim milk, cranberries  . Sandwiches:  o Hummus and grilled vegetables (peppers, zucchini, squash) on whole wheat bread   o Grilled chicken on whole wheat pita with lettuce, tomatoes, cucumbers or tzatziki  o Tuna salad on whole wheat bread: tuna salad made with greek yogurt,  olives, red peppers, capers, green onions o Garlic rosemary lamb pita: lamb sauted with garlic, rosemary, salt & pepper; add lettuce, cucumber, greek yogurt to pita - flavor with lemon juice and black pepper  . Seafood:  o Mediterranean grilled salmon, seasoned with garlic, basil, parsley, lemon juice and black pepper o Shrimp, lemon, and spinach whole-grain pasta salad made with low fat greek yogurt  o Seared scallops with lemon orzo  o Seared tuna steaks seasoned salt, pepper, coriander topped with tomato mixture of olives, tomatoes, olive oil, minced garlic, parsley, green onions and cappers  . Meats:  o Herbed greek chicken salad with kalamata olives, cucumber, feta  o Red bell peppers stuffed with spinach, bulgur, lean ground beef (or lentils) & topped with feta   o Kebabs: skewers of chicken, tomatoes, onions, zucchini, squash  o [place] burgers:  made with red onions, mint, dill, lemon juice, feta cheese topped with roasted red peppers . Vegetarian o Cucumber salad: cucumbers, artichoke hearts, celery, red onion, feta cheese, tossed in olive oil & lemon juice  o Hummus and whole grain pita points with a greek salad (lettuce, tomato, feta, olives, cucumbers, red onion) o Lentil soup with celery, carrots made with vegetable broth, garlic, salt and pepper  o Tabouli salad: parsley, bulgur, mint, scallions, cucumbers, tomato, radishes, lemon juice, olive oil, salt and pepper.

## 2018-06-09 ENCOUNTER — Telehealth: Payer: Self-pay

## 2018-06-09 NOTE — Telephone Encounter (Signed)
Spoke to patient I received walk in sheet you wanted 05/31/18 echo results.Dr.Jordan reviewed.He advised EF low 25 to 30 %.He advised schedule appointment to discuss.Appointment scheduled with Dr.Jordan 06/15/18 at 3:20 pm.

## 2018-06-12 ENCOUNTER — Other Ambulatory Visit: Payer: Self-pay | Admitting: Cardiology

## 2018-06-14 NOTE — Progress Notes (Signed)
Cardiology Office Note    Date:  06/15/2018   ID:  JOAL EAKLE, DOB [birth year], MRN [id number]  PCP:  [name], DO  Cardiologist:  Dr. Stony Stegmann [name]    History of Present Illness:  Dennis Lewis is a 62 y.o. male with PMH of hypertension, GERD, hyperlipidemia, chronic LBBB, history of NSVT, prostate cancer s/p radiation therapy, persistent atrial flutter, and NICM with EF 35-40%. He has a history of Etoh abuse.  He was admitted for NSTEMI on 06/06/2016, he underwent emergent cardiac catheterization which only showed 30% proximal RCA disease, 25-35% ejection fraction, pattern of apical wall motion abnormality.  Surprisingly, his troponin peaked at 31.36 which is a lot higher than expected for stress-induced cardiomyopathy. Retrospectively, it is possible that he had an embolic MI.  Imdur and hydralazine were added. Initial echo obtained in September showed ejection fraction 25-30%. He was also noted to have new atrial fibrillation, due to elevated CHA2DS2-Vasc score, he was started on eliquis. Repeat echocardiogram 6 weeks later on 07/20/2016 showed ejection fraction has improved to 41-66%, grade 3 diastolic dysfunction, paradoxical ventricular septal motion consistent with LBBB. He quit drinking for 11 months.   He was seen in May 2019  for preoperative clearance prior to right elbow and upper arm procedure. Repeat Echo was ordered but rescheduled multiple times and finally performed this month. This demonstrated reduced LV function with EF 25-30% .   He denies any chest pain. He has no SOB at rest, orthopnea, PND, palpitations. He does note he gives out of breath easily.   His persistent atrial fib/flutter is well rate controlled.  He did resume drinking last year but reports he quit again 2 weeks ago and he is done with drinking.    Past Medical History:  Diagnosis Date  . A-fib ([place])   . Anxiety   . Blood clot in vein 2017  . Cardiomyopathy due to hypertension, without heart failure  ([place])   . Congestive dilated cardiomyopathy ([place]) 04/23/2016  . GERD (gastroesophageal reflux disease)   . H/O cardiovascular stress test 2010   [name]  . H/O echocardiogram 2010    mild LVH with some septal dyssynergy, EF 45%, impaired relaxation  . History of prostate cancer   . Hyperlipidemia   . Hypertension   . Left bundle branch block   . NSVT (nonsustained ventricular tachycardia) ([place])   . Palpitations   . Prostate cancer ([place]) 04/02/14   Gleason 7, volume 30 gm  . S/P radiation therapy 06/26/2014 through 08/22/2014                                                      Prostate 7800 cGy in 40 sessions                        . Torn tendon    right shoulder    Past Surgical History:  Procedure Laterality Date  . BACK SURGERY  1993  . CARDIAC CATHETERIZATION  1990   Normal cors  . CARDIAC CATHETERIZATION N/A 06/06/2016   Procedure: Left Heart Cath and Coronary Angiography;  Surgeon: Jettie Booze, MD;  Location: [facility];  Service: Cardiovascular;  Laterality: N/A;  . PROSTATE BIOPSY  04/02/14   Gleason 7, vol 30 gm    Current  Medications: Outpatient Medications Prior to Visit  Medication Sig Dispense Refill  . ELIQUIS 5 MG TABS tablet TAKE 1 TABLET BY MOUTH TWICE DAILY 120 tablet 2  . furosemide (LASIX) 20 MG tablet Take 2 tablets (40 mg total) by mouth daily. KEEP OV. 180 tablet 0  . isosorbide mononitrate (IMDUR) 30 MG 24 hr tablet TAKE 1 TABLET BY MOUTH ONCE DAILY  30 tablet 1  . KLOR-CON 10 10 MEQ tablet TAKE 1 TABLET BY MOUTH TWICE A DAY 60 tablet 6  . metoprolol succinate (TOPROL-XL) 100 MG 24 hr tablet Take 1 tablet (100 mg total) by mouth daily. 30 tablet 9  . nitroGLYCERIN (NITROSTAT) 0.4 MG SL tablet Place 1 tablet under the tongue as needed. X 3 doses    . valACYclovir (VALTREX) 1000 MG tablet Take 1 g by mouth daily as needed (for outbreaks).     . diazepam (VALIUM) 5 MG tablet Take 1 tablet by mouth daily as needed.     No  facility-administered medications prior to visit.      Allergies:   Ace inhibitors; Chocolate; Other; Peanut-containing drug products; and Hydralazine   Social History   Socioeconomic History  . Marital status: Single    Spouse name: Not on file  . Number of children: 2  . Years of education: Not on file  . Highest education level: Not on file  Occupational History  . Occupation: Multimedia programmer  . Financial resource strain: Not on file  . Food insecurity:    Worry: Not on file    Inability: Not on file  . Transportation needs:    Medical: Not on file    Non-medical: Not on file  Tobacco Use  . Smoking status: Former Smoker    Types: Cigarettes    Last attempt to quit: 02/22/1979    Years since quitting: 39.3  . Smokeless tobacco: Never Used  Substance and Sexual Activity  . Alcohol use: Yes    Alcohol/week: 28.0 standard drinks    Types: 28 Cans of beer per week    Comment: beer daily - 4 daily  . Drug use: No  . Sexual activity: Not Currently  Lifestyle  . Physical activity:    Days per week: Not on file    Minutes per session: Not on file  . Stress: Not on file  Relationships  . Social connections:    Talks on phone: Not on file    Gets together: Not on file    Attends religious service: Not on file    Active member of club or organization: Not on file    Attends meetings of clubs or organizations: Not on file    Relationship status: Not on file  Other Topics Concern  . Not on file  Social History Narrative  . Not on file     Family History:  The patient's family history includes Esophageal cancer in his father; Prostate cancer in his brother; Throat cancer (age of onset: 62) in his father.   ROS:   Please see the history of present illness.    ROS All other systems reviewed and are negative.   PHYSICAL EXAM:   VS:  BP 138/82   Pulse 77   Ht 5\' 11"  (1.803 m)   Wt 194 lb 6.4 oz (88.2 kg)   BMI 27.11 kg/m    GENERAL:  Well  appearing HEENT:  PERRL, EOMI, sclera are clear. Oropharynx is clear. NECK:  No jugular venous distention, carotid  upstroke brisk and symmetric, no bruits, no thyromegaly or adenopathy LUNGS:  Clear to auscultation bilaterally CHEST:  Unremarkable HEART:  IRRR,  PMI not displaced or sustained,S1 and S2 within normal limits, no S3, no S4: no clicks, no rubs, no murmurs ABD:  Soft, nontender. BS +, no masses or bruits. No hepatomegaly, no splenomegaly EXT:  2 + pulses throughout, no edema, no cyanosis no clubbing SKIN:  Warm and dry.  No rashes NEURO:  Alert and oriented x 3. Cranial nerves II through XII intact. PSYCH:  Cognitively intact    Wt Readings from Last 3 Encounters:  06/15/18 194 lb 6.4 oz (88.2 kg)  06/07/18 195 lb (88.5 kg)  08/16/17 177 lb (80.3 kg)      Studies/Labs Reviewed:   EKG:  EKG is ordered today.  The ekg ordered today demonstrates atrial fibrillation/flutter rate 77, left bundle branch block. I have personally reviewed and interpreted this study.   Recent Labs: No results found for requested labs within last 8760 hours.   Lipid Panel No results found for: CHOL, TRIG, HDL, CHOLHDL, VLDL, LDLCALC, LDLDIRECT  Additional studies/ records that were reviewed today include:  Labs dated 02/16/17: cholesterol 255, triglycerides 291, HDL 44, LDL 153. Chemistries, CBC, TSH normal .  Cath 06/06/2016 Conclusion     Prox RCA lesion, 30 %stenosed at a bend.  The left ventricular ejection fraction is 25-35% by visual estimate. The pattern of apical wall motion abnormality without severe CAD is suggestive of Takotsubo cardiomyopathy.  LV end diastolic pressure is moderately elevated.  There is no aortic valve stenosis.   Continue with aggressive medical therapy. He is allergic to ace inhibitors. Will add isosorbide to his hydralazine. Continue beta blocker. Upon further questioning, the patient did have very stressful news early this morning regarding his  business. He states that he was lying in bed for about 3 hours very worried about what he would do. He then started to have chest discomfort. The history is also consistent with Takatsubo cardiomyopathy.  May need diuresis given moderately elevated LVEDP.      Echo 07/20/2016 LV EF: 40% -   45%  Study Conclusions  - Left ventricle: The cavity size was normal. Systolic function was   mildly to moderately reduced. The estimated ejection fraction was   in the range of 40% to 45%. Diffuse hypokinesis. There was a   reduced contribution of atrial contraction to ventricular   filling, due to increased ventricular diastolic pressure or   atrial contractile dysfunction. Doppler parameters are consistent   with a reversible restrictive pattern, indicative of decreased   left ventricular diastolic compliance and/or increased left   atrial pressure (grade 3 diastolic dysfunction). - Ventricular septum: Septal motion showed moderate paradox. These   changes are consistent with intraventricular conduction delay. - Left atrium: The atrium was mildly dilated. - Pulmonary arteries: PA peak pressure: 40 mm Hg (S).  Impressions:  - The right ventricular systolic pressure was increased consistent   with mild pulmonary hypertension.  Echo 05/31/18: Study Conclusions  - Left ventricle: Systolic function was severely reduced. The   estimated ejection fraction was in the range of 25% to 30%. - Mitral valve: There was mild regurgitation.  ASSESSMENT:    1. Chronic systolic CHF (congestive heart failure) ([place])   2. Nonischemic cardiomyopathy ([place])   3. LBBB (left bundle branch block)   4. Permanent atrial fibrillation   5. Essential hypertension   6. Hyperlipidemia, unspecified hyperlipidemia type  PLAN:  In order of problems listed above:  1. Preoperative clearance: requested for right elbow surgery by Dr. Roseanne Kaufman.  I think he is a suitable candidate for planned surgery. He  is not in overt CHF. No active angina. Afib/ flutter is controlled. He is at moderate risk for general anesthesia due to LV dysfunction. He may hold Eliquis 3 days prior to surgery without bridging.   2. Chronic systolic CHF. NICM: Reviewed his cardiac catheterization in 2017 only showed 30% RCA disease, repeat echocardiogram obtained on 07/20/2016 showed EF improved to 40-45%.  Now Echo with reduced EF 25-30%. LV dysfunction is multifactorial related to Etoh abuse, dyssynergy with LBBB, prior embolic event. Stressed importance of abstinence from [place]. We reviewed ? Allergy to ACEi. He states this was just cold sores and that he had no swelling of lips or tongue. I would like to initiate Entresto 24/26 mg bid. Will check baseline BMET. Consider adding aldactone if tolerated. Will reduce lasix to 20 mg daily. Stop Imdur. Will follow up with Pharm D for titration of meds. Plan to repeat Echo in 3-4 months after optimization of therapy. If EF remains low may be a candidate for CRT/ICD.   3. Chronic atrial fib/flutter: on eliquis 5mg  BID. CHA2DS2-Vasc score 2 (HTN, HF).  Well rate controlled on Toprol-XL  4. Hypertension: Blood pressure well controlled  5. Hyperlipidemia: Not on a statin. Is scheduled for follow up labs next month with primary care. If LDL still high would consider adding statin therapy.    Medication Adjustments/Labs and Tests Ordered: Current medicines are reviewed at length with the patient today.  Concerns regarding medicines are outlined above.  Medication changes, Labs and Tests ordered today are listed in the Patient Instructions below. Patient Instructions  We will check a renal panel today.  Reduce lasix to 20 mg daily  Stop Imdur   We will add Entresto 24/26 mg twice a day  We will have you follow up with our Pharm D to titrate medication  Follow up with me in 3 months        Signed, Cesareo Vickrey [name], MD  06/15/2018 4:58 PM    [facility]  [facility], [place], [place]  [address] Phone: [phone]; Fax: [phone]

## 2018-06-15 ENCOUNTER — Ambulatory Visit (INDEPENDENT_AMBULATORY_CARE_PROVIDER_SITE_OTHER): Payer: BLUE CROSS/BLUE SHIELD | Admitting: Cardiology

## 2018-06-15 ENCOUNTER — Encounter: Payer: Self-pay | Admitting: Cardiology

## 2018-06-15 VITALS — BP 138/82 | HR 77 | Ht 71.0 in | Wt 194.4 lb

## 2018-06-15 DIAGNOSIS — I428 Other cardiomyopathies: Secondary | ICD-10-CM | POA: Diagnosis not present

## 2018-06-15 DIAGNOSIS — I10 Essential (primary) hypertension: Secondary | ICD-10-CM

## 2018-06-15 DIAGNOSIS — I482 Chronic atrial fibrillation: Secondary | ICD-10-CM

## 2018-06-15 DIAGNOSIS — I447 Left bundle-branch block, unspecified: Secondary | ICD-10-CM

## 2018-06-15 DIAGNOSIS — I4821 Permanent atrial fibrillation: Secondary | ICD-10-CM

## 2018-06-15 DIAGNOSIS — I5022 Chronic systolic (congestive) heart failure: Secondary | ICD-10-CM

## 2018-06-15 DIAGNOSIS — E785 Hyperlipidemia, unspecified: Secondary | ICD-10-CM

## 2018-06-15 NOTE — Patient Instructions (Signed)
We will check a renal panel today.  Reduce lasix to 20 mg daily  Stop Imdur   We will add Entresto 24/26 mg twice a day  We will have you follow up with our Pharm D to titrate medication  Follow up with me in 3 months

## 2018-06-16 LAB — BASIC METABOLIC PANEL
BUN / CREAT RATIO: 22 (ref 10–24)
BUN: 24 mg/dL (ref 8–27)
CHLORIDE: 102 mmol/L (ref 96–106)
CO2: 23 mmol/L (ref 20–29)
CREATININE: 1.11 mg/dL (ref 0.76–1.27)
Calcium: 9.4 mg/dL (ref 8.6–10.2)
GFR calc Af Amer: 81 mL/min/{1.73_m2} (ref >59)
GFR calc non Af Amer: 70 mL/min/{1.73_m2} (ref >59)
GLUCOSE: 92 mg/dL (ref 65–99)
POTASSIUM: 4.9 mmol/L (ref 3.5–5.2)
SODIUM: 143 mmol/L (ref 134–144)

## 2018-06-19 ENCOUNTER — Other Ambulatory Visit: Payer: Self-pay

## 2018-06-19 DIAGNOSIS — I10 Essential (primary) hypertension: Secondary | ICD-10-CM

## 2018-06-19 DIAGNOSIS — I420 Dilated cardiomyopathy: Secondary | ICD-10-CM

## 2018-06-22 ENCOUNTER — Ambulatory Visit: Payer: BLUE CROSS/BLUE SHIELD

## 2018-06-22 DIAGNOSIS — I420 Dilated cardiomyopathy: Secondary | ICD-10-CM | POA: Diagnosis not present

## 2018-06-22 DIAGNOSIS — I10 Essential (primary) hypertension: Secondary | ICD-10-CM | POA: Diagnosis not present

## 2018-06-22 LAB — BASIC METABOLIC PANEL
BUN/Creatinine Ratio: 12 (ref 10–24)
BUN: 13 mg/dL (ref 8–27)
CALCIUM: 9.4 mg/dL (ref 8.6–10.2)
CO2: 22 mmol/L (ref 20–29)
Chloride: 104 mmol/L (ref 96–106)
Creatinine, Ser: 1.06 mg/dL (ref 0.76–1.27)
GFR calc Af Amer: 86 mL/min/{1.73_m2} (ref >59)
GFR calc non Af Amer: 74 mL/min/{1.73_m2} (ref >59)
GLUCOSE: 74 mg/dL (ref 65–99)
POTASSIUM: 5 mmol/L (ref 3.5–5.2)
SODIUM: 142 mmol/L (ref 134–144)

## 2018-06-22 NOTE — Progress Notes (Deleted)
Patient ID: Dennis Lewis                 DOB: 1956/07/12                      MRN: [id number]     HPI: Dennis Lewis is a 62 y.o. male referred by Dr. [name] to pharmacist clinic for Entrato titration and medication management. PMH includes hypertension, GERD, hyperlipidemia, chronic LBBB, history of NSVT, atrial flutter, hx of alcohol abuse, and EF of 25-30% per ECHO completed on 05/31/18.  Per Dr [name] recent note, plan is to STOP Imdur, decrease furosemide to 20mg  daily,  start Entresto 24/26 twice daily, and start spironolactone if tolerated.   Current HF/HTN meds:  Metoprolol succinate 100mg  daily Furosemide 40mg  daily isosorbide mononitrate 30mg  daily ??  BP goal: <130/80  Family History:   Social History:   Diet:   Exercise:   Home BP readings:   Wt Readings from Last 3 Encounters:  06/15/18 194 lb 6.4 oz (88.2 kg)  06/07/18 195 lb (88.5 kg)  08/16/17 177 lb (80.3 kg)   BP Readings from Last 3 Encounters:  06/15/18 138/82  06/07/18 (!) 163/96  08/16/17 122/84   Pulse Readings from Last 3 Encounters:  06/15/18 77  06/07/18 69  08/16/17 74    Renal function: Estimated Creatinine Clearance: 72.5 mL/min (by C-G formula based on SCr of 1.11 mg/dL).  Past Medical History:  Diagnosis Date  . A-fib ([place])   . Anxiety   . Blood clot in vein 2017  . Cardiomyopathy due to hypertension, without heart failure ([place])   . Congestive dilated cardiomyopathy ([place]) 04/23/2016  . GERD (gastroesophageal reflux disease)   . H/O cardiovascular stress test 2010   [name]  . H/O echocardiogram 2010    mild LVH with some septal dyssynergy, EF 45%, impaired relaxation  . History of prostate cancer   . Hyperlipidemia   . Hypertension   . Left bundle branch block   . NSVT (nonsustained ventricular tachycardia) ([place])   . Palpitations   . Prostate cancer ([name]) 04/02/14   Gleason 7, volume 30 gm  . S/P radiation therapy 06/26/2014 through 08/22/2014                                                       Prostate 7800 cGy in 40 sessions                        . Torn tendon    right shoulder    Current Outpatient Medications on File Prior to Visit  Medication Sig Dispense Refill  . ELIQUIS 5 MG TABS tablet TAKE 1 TABLET BY MOUTH TWICE DAILY 120 tablet 2  . furosemide (LASIX) 20 MG tablet Take 2 tablets (40 mg total) by mouth daily. KEEP OV. 180 tablet 0  . isosorbide mononitrate (IMDUR) 30 MG 24 hr tablet TAKE 1 TABLET BY MOUTH ONCE DAILY ***CALL  TO  SCHEDULE  FUTURE  APPOINTMENT*** 30 tablet 1  . KLOR-CON 10 10 MEQ tablet TAKE 1 TABLET BY MOUTH TWICE A DAY 60 tablet 6  . metoprolol succinate (TOPROL-XL) 100 MG 24 hr tablet Take 1 tablet (100 mg total) by mouth daily. 30 tablet 9  . nitroGLYCERIN (NITROSTAT) 0.4 MG SL tablet  Place 1 tablet under the tongue as needed. X 3 doses    . valACYclovir (VALTREX) 1000 MG tablet Take 1 g by mouth daily as needed (for outbreaks).      No current facility-administered medications on file prior to visit.     Allergies  Allergen Reactions  . Ace Inhibitors Other (See Comments)    Cold sores   . Chocolate Other (See Comments)    Blisters   . Other Other (See Comments)    Seed on bread causes lip blisters Anesthesia also, but does nor remember which one  . Peanut-Containing Drug Products Other (See Comments)    Blisters  . Hydralazine Palpitations    Irregular heartbeat, fluttering    There were no vitals taken for this visit.  No problem-specific Assessment & Plan notes found for this encounter.  Joshlyn Beadle Rodriguez-Guzman PharmD, BCPS, [place] [id number] 06/22/2018 9:54 AM

## 2018-06-23 ENCOUNTER — Telehealth: Payer: Self-pay | Admitting: Cardiology

## 2018-06-23 NOTE — Telephone Encounter (Signed)
Returned call to patient's wife she stated every time medications are changed he gets fever blisters.Stated he recently started taking entresto.He woke up this morning with 4 fever blisters.Stated in the past cipro works the best.She wanted to ask if we could prescribe cipro.Advised Dr.Jordan out of office. I will send message to our pharmacist for advice.

## 2018-06-23 NOTE — Telephone Encounter (Signed)
° °  Patient's spouse calling to request Dr [name] write  a prescription for Cipro. She states whenever the patients medications get changed he get cold sores. Please call

## 2018-06-23 NOTE — Telephone Encounter (Signed)
Returned call to patient's wife Raquel's recommendation given.

## 2018-06-23 NOTE — Telephone Encounter (Signed)
Cipro is not an appropriate medication to treat fever blisters and is not inidicated at this time.   Patient valtrex 1000mg  PRN on his medication list. This medication may be used for treatment severe blisters.    Otherwise, non-severe fever blisters should resolved without treatment. Cold compress and local anesthetics like Orajel may provide relive until resolution.

## 2018-06-29 ENCOUNTER — Other Ambulatory Visit: Payer: Self-pay

## 2018-06-29 DIAGNOSIS — I10 Essential (primary) hypertension: Secondary | ICD-10-CM | POA: Diagnosis not present

## 2018-06-29 DIAGNOSIS — I420 Dilated cardiomyopathy: Secondary | ICD-10-CM | POA: Diagnosis not present

## 2018-06-30 ENCOUNTER — Other Ambulatory Visit: Payer: Self-pay

## 2018-06-30 LAB — BASIC METABOLIC PANEL
BUN / CREAT RATIO: 12 (ref 10–24)
BUN: 11 mg/dL (ref 8–27)
CO2: 21 mmol/L (ref 20–29)
Calcium: 9.2 mg/dL (ref 8.6–10.2)
Chloride: 101 mmol/L (ref 96–106)
Creatinine, Ser: 0.92 mg/dL (ref 0.76–1.27)
GFR calc Af Amer: 102 mL/min/{1.73_m2} (ref >59)
GFR calc non Af Amer: 88 mL/min/{1.73_m2} (ref >59)
GLUCOSE: 84 mg/dL (ref 65–99)
POTASSIUM: 4.5 mmol/L (ref 3.5–5.2)
SODIUM: 143 mmol/L (ref 134–144)

## 2018-06-30 MED ORDER — SACUBITRIL-VALSARTAN 24-26 MG PO TABS: 1.0000 | ORAL_TABLET | Freq: Two times a day (BID) | ORAL | 0 refills | Status: DC

## 2018-06-30 NOTE — Telephone Encounter (Signed)
Patient's wife, Lattie Haw, walked into the office this morning requesting samples or a prescription for entresto sent to patient's pharmacy.   Called patient to verify correct pharmacy. Free 30 day supply was sent to [facility]  in [place] as requested.

## 2018-07-02 DIAGNOSIS — Z789 Other specified health status: Secondary | ICD-10-CM | POA: Insufficient documentation

## 2018-07-02 NOTE — Progress Notes (Signed)
NEW PT OFFICE VISIT   Impression and Recommendations:    1. Atrial flutter, unspecified type ([place])   2. Congestive dilated cardiomyopathy ([place])   3. Hypertensive heart disease without heart failure   4. Malignant neoplasm of prostate ([place])   5. S/P radiation therapy for prostate CA   6. 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 Establishing care with new doctor, encounter for    - H/O Embolic CM and A-Fib:   Please follow-up with cardiology in the very near future regarding your recent echocardiogram results.  You need to ask them whether or not you will need a medication change based on your last echo, and what your goal blood pressure should be.  Med Mgt per Cardiology in lieu of recent Echo results.   - Bp poorly controlled.  BP and HR mgt per Cardiology.  Recent Echo deteriorated for unknown reasons.  -  Please make sure you are checking your blood pressures at home at least 3 times per week after sitting quietly for 15 to 20 minutes and then bring that those blood pressures into the cardiologist office as well as every time you come see me.  Until you are seen by cardiology and told something different, try to get your blood pressure in the 130s over 80s or less  - We will get your medical records from [facility] - Dr Brigitte Pulse as well as any specialist's you see please-  however since you are due for a physical exam in the near future I want you to make an appointment for a yearly physical and come in for fasting blood work same [place]  - Strongly encouraged to cut back. Long d/c pt re: detrimental effects to mental and physical health, his heart etc.  Pt will think seriously about cutting back. Declines need for help/ counseling   Expresses verbal understanding and consents to current therapy and treatment regimen.  No barriers to understanding were identified.  Red flag symptoms and signs discussed in detail.  Patient expressed understanding regarding what to do in  case of emergency\urgent symptoms  Please see AVS handed out to patient at the end of our visit for further patient instructions/ counseling done pertaining to today's office visit.   Return for CPE/ yrly physical, come fasting next available.     Note:  This note was prepared with assistance of Dragon voice recognition software. Occasional wrong-word or sound-a-like substitutions may have occurred due to the inherent limitations of voice recognition software.      --------------------------------------------------------------------------------------------------------------------------------------------------------------------------------------------------------------------------------------------    Subjective:     HPI: Dennis Lewis is a 62 y.o. male who presents to [place] today for issues as discussed below.   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   Seen prior at [facility]-  Dr Brigitte Pulse - went maybe once a year but admits doesn't like doctors.     PMHx sign for prostate CA s/p radiation ( dr. Risa Grill he see's q 6 mo );   heavy ETOH use-  4-6 bud light beers/ day- more on wkends;  HTN- meds since '17, and per pt in 2017 admitted for "a heart attack" but told he "didn't really have one."   They thought it was "a  blood clot" that likely caused him to go into A-Fib and subsequently have cardiac CardioMyopathy / dec EF since that incident.   Pt denies h/o HLD, DM, or thyroid dx   Mom- died fatal car accident age 53,  Dad - throat CA age 49,  60 Bro's -30 w/ prostate CA and 1 sister  -  Sept 2017:  NSTEMI-   ( Per Cards:) emergent cath- which only showed 30% proximal RCA disease, 25-35% ejection fraction, pattern of apical wall motion abnormality without severe CAD suggestive  ofcardiomyopathy. However per cards, labs did not indicate stress-induced cardiomyopathy.   Initial echo obtained in September '17 showed ejection fraction 25-30%.  He was also noted to have new atrial fibrillation, due to elevated CHA2DS2-Vasc score,he was started on eliquis. Repeat echocardiogram 6 weeks later on 07/20/2016 showed ejection fraction has improved to 38-18%, grade 3 diastolic dysfunction, paradoxical ventricular septal motion consistent withLBBB.    recently pt had a Echo as part of a pre-op clearance w/up for R elbow/arm sx and has not followed up with Cards again yet to discuss findings of the Echo.    Admits to me today that he drinks pretty heavily and would like to quit. Wife worried it can have negative effect on heart.   Tells me his is allergic to wheat, chocolate, peanuts.    Wt Readings from Last 3 Encounters:  06/07/18 195 lb (88.5 kg)  08/16/17 177 lb (80.3 kg)   BP Readings from Last 3 Encounters:  06/07/18 (!) 163/96  08/16/17 122/84   Pulse Readings from Last 3 Encounters:  06/07/18 69  08/16/17 74   BMI Readings from Last 3 Encounters:  06/07/18 28.80 kg/m  08/16/17 26.14 kg/m    Patient Care Team    Relationship Specialty Notifications Start End  [name], DO PCP - General Family Medicine  06/07/18   [name], [name], [place] Physician Cardiology  03/20/15   Nahser, Wonda Cheng, MD Consulting Physician Cardiology  06/07/18   Rana Snare, MD Consulting Physician Urology  06/07/18   Allyn Kenner, MD  Dermatology  06/07/18      Patient Active Problem List   Diagnosis Date Noted  . 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 HLD (hyperlipidemia) 06/07/2018    Priority: High  . Atrial flutter ([place]) 08/26/2016    Priority: High  . Congestive dilated cardiomyopathy ([place]) 04/23/2016    Priority: High  . Essential hypertension 07/19/2013    Priority: High  . Malignant neoplasm of prostate ([place])  05/14/2014    Priority: Medium  . GERD (gastroesophageal reflux disease) 06/07/2018    Priority: Low  . S/P radiation therapy for prostate CA 06/07/2018    Priority: Low  . Left bundle branch block 07/19/2013    Priority: Low  . 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 Chest pain   . Elevated troponin   . Hypertensive heart disease 04/23/2016  . NSVT (nonsustained ventricular tachycardia) ([place]) 08/21/2015  . Palpitations 07/19/2013   Past Medical History:  Diagnosis Date  . A-fib ([place])   . Anxiety   . Blood clot in vein 2017  . Cardiomyopathy due to hypertension, without heart failure ([place])   . Congestive dilated cardiomyopathy ([place]) 04/23/2016  . GERD (gastroesophageal reflux disease)   . H/O cardiovascular stress test 2010   [name]  . H/O echocardiogram 2010    mild LVH with some septal dyssynergy, EF 45%, impaired relaxation  . History of prostate  cancer   . Hyperlipidemia   . Hypertension   . Left bundle branch block   . NSVT (nonsustained ventricular tachycardia) ([place])   . Palpitations   . Prostate cancer ([place]) 04/02/14   Gleason 7, volume 30 gm  . S/P radiation therapy 06/26/2014 through 08/22/2014                                                      Prostate 7800 cGy in 40 sessions                        . Torn tendon    right shoulder   Past Surgical History:  Procedure Laterality Date  . BACK SURGERY  1993  . CARDIAC CATHETERIZATION  1990   Normal cors  . CARDIAC CATHETERIZATION N/A 06/06/2016   Procedure: Left Heart Cath and Coronary Angiography;  Surgeon: Jettie Booze, MD;  Location: [facility];  Service: Cardiovascular;  Laterality: N/A;  . PROSTATE BIOPSY  04/02/14   Gleason 7, vol 30 gm    Family History  Problem Relation Age of Onset  . Throat cancer Father 59       smoker  . Esophageal cancer Father   . Prostate cancer Brother        surgery  . Colon cancer Neg Hx   . Diabetes Neg Hx   . Colon polyps Neg Hx      Social  History   Socioeconomic History  . Marital status: Single    Spouse name: Not on file  . Number of children: 2  . Years of education: Not on file  . Highest education level: Not on file  Occupational History  . Occupation: Multimedia programmer  . Financial resource strain: Not on file  . Food insecurity:    Worry: Not on file    Inability: Not on file  . Transportation needs:    Medical: Not on file    Non-medical: Not on file  Tobacco Use  . Smoking status: Former Smoker    Types: Cigarettes    Last attempt to quit: 02/22/1979    Years since quitting: 39.3  . Smokeless tobacco: Never Used  Substance and Sexual Activity  . Alcohol use: Yes    Alcohol/week: 28.0 standard drinks    Types: 28 Cans of beer per week    Comment: beer daily - 4 daily  . Drug use: No  . Sexual activity: Not Currently  Lifestyle  . Physical activity:    Days per week: Not on file    Minutes per session: Not on file  . Stress: Not on file  Relationships  . Social connections:    Talks on phone: Not on file    Gets together: Not on file    Attends religious service: Not on file    Active member of club or organization: Not on file    Attends meetings of clubs or organizations: Not on file    Relationship status: Not on file  . Intimate partner violence:    Fear of current or ex partner: Not on file    Emotionally abused: Not on file    Physically abused: Not on file    Forced sexual activity: Not on file  Other Topics Concern  . Not  on file  Social History Narrative  . Not on file    MEDS:  Pt not sure of his current meds- we will get from his pharmacy  Allergies:  Allergies  Allergen Reactions  . Ace Inhibitors Other (See Comments)    Cold sores   . Chocolate Other (See Comments)    Blisters   . Other Other (See Comments)    Seed on bread causes lip blisters Anesthesia also, but does nor remember which one  . Peanut-Containing Drug Products Other (See Comments)     Blisters  . Hydralazine Palpitations    Irregular heartbeat, fluttering     Review of Systems:  A fourteen system review of systems was performed and found to be positive as per HPI. Only + was he feels "fatigued."    Objective:   Blood pressure (!) 163/96, pulse 69, height 5\' 9"  (1.753 m), weight 195 lb (88.5 kg), SpO2 100 %. Body mass index is 28.8 kg/m. General:  Well Developed, well nourished, appropriate for stated age.  Neuro:  Alert and oriented,  extra-ocular muscles intact  HEENT:  Normocephalic, atraumatic, neck supple, no carotid bruits appreciated  Skin:  no gross rash, warm, pink. Cardiac:  RRR, S1 S2 Respiratory:  ECTA B/L and A/P, Not using accessory muscles, speaking in full sentences- unlabored. Vascular:  Ext warm, no cyanosis apprec.; cap RF less 2 sec. Psych:  No HI/SI, judgement and insight good, Euthymic mood. Full Affect.

## 2018-07-04 ENCOUNTER — Other Ambulatory Visit: Payer: Self-pay | Admitting: Physician Assistant

## 2018-07-06 ENCOUNTER — Ambulatory Visit (INDEPENDENT_AMBULATORY_CARE_PROVIDER_SITE_OTHER): Payer: BLUE CROSS/BLUE SHIELD | Admitting: Pharmacist Clinician (PhC)/ Clinical Pharmacy Specialist

## 2018-07-06 DIAGNOSIS — I420 Dilated cardiomyopathy: Secondary | ICD-10-CM

## 2018-07-06 NOTE — Progress Notes (Signed)
07/07/2018 Dennis Lewis [birth year] [id number]   HPI:  Dennis Lewis is a 62 y.o. male patient of Dr Dennis Lewis, with a [place] below who presents today for heart failure medication titration.  Patient has HFrEF with most recent echo at 35-40%.  He also has a history of DVT, hypertension, hyperlipidemia, LBBB and s/p radiation therapy for prostate cancer.  An echocardiogram last month showed his EF was at 25-30%.  It had been that low back in 2017, but improved to 40-45% in Oct 2017.  Around that time he was found to be in atrial fibrillation and started on Eliquis.  When he saw Dr. [name] last month he was started on Entresto 24/26 mg bid.  He was also asked to reduce the furosemide from 40 mg to 20 mg daily.   Labs drawn since starting show stable electrolytes and kidney function.  Today patient reports feeling well.  He had an outbreak of fever blisters a few days after starting the [place], but that has cleared.    Blood Pressure Goal:  130/80  Current Medications:  Metoprolol succ 100 mg - am  Furosemide 40 mg - am  Isosorbide mono - am  Potassium 10 mEq bid  Family Hx:  Father - cancer,   Mother - MVA  MGF - multiple MI's, other grandparents all died from old age  Social Hx:  Had stopped drinking, admits to previously drinking as much as a 12-pack of beer on weekend days              no tobacco since 1980;   Some decaf coffee, no caffeinated sodas  Diet:  Mostly home cooked meals; often banana and cheerios for dinner, sometimes salad (lettuce, carrots);   Exercise:  Walk 4 miles 4 days per week as weather allows  Home BP readings:  No home readings - checks at HF.  Recalls 117/78; highest around 759 systolic  Intolerances:   Peanuts, chocolate, wheat  - causes cold sores  Labs:  9/26:   Na 143, K 4.9, Glu 92, BUN 24, SCr 1.11  10/10: Na 143, K 4.5, Glu 84, BUN 11, SCr 0.92 Wt Readings from Last 3 Encounters:  06/15/18 194 lb 6.4 oz (88.2 kg)  06/07/18 195 lb (88.5  kg)  08/16/17 177 lb (80.3 kg)   BP Readings from Last 3 Encounters:  07/06/18 (!) 156/78  06/15/18 138/82  06/07/18 (!) 163/96   Pulse Readings from Last 3 Encounters:  07/06/18 70  06/15/18 77  06/07/18 69    Current Outpatient Medications  Medication Sig Dispense Refill  . ELIQUIS 5 MG TABS tablet TAKE 1 TABLET BY MOUTH TWICE DAILY 120 tablet 2  . furosemide (LASIX) 20 MG tablet Take 2 tablets (40 mg total) by mouth daily. KEEP OV. 180 tablet 0  . isosorbide mononitrate (IMDUR) 30 MG 24 hr tablet Take 1 tablet (30 mg total) by mouth daily. 90 tablet 3  . KLOR-CON 10 10 MEQ tablet TAKE 1 TABLET BY MOUTH TWICE A DAY 60 tablet 6  . metoprolol succinate (TOPROL-XL) 100 MG 24 hr tablet Take 1 tablet (100 mg total) by mouth daily. 30 tablet 9  . nitroGLYCERIN (NITROSTAT) 0.4 MG SL tablet Place 1 tablet under the tongue as needed. X 3 doses    . potassium chloride (K-DUR,KLOR-CON) 10 MEQ tablet TAKE 1 TABLET BY MOUTH TWICE DAILY 60 tablet 3  . sacubitril-valsartan (ENTRESTO) 24-26 MG Take 1 tablet by mouth 2 (two) times daily. [place]  tablet 0  . valACYclovir (VALTREX) 1000 MG tablet Take 1 g by mouth daily as needed (for outbreaks).      No current facility-administered medications for this visit.     Allergies  Allergen Reactions  . Ace Inhibitors Other (See Comments)    Cold sores   . Chocolate Other (See Comments)    Blisters   . Other Other (See Comments)    Seed on bread causes lip blisters Anesthesia also, but does nor remember which one  . Peanut-Containing Drug Products Other (See Comments)    Blisters  . Hydralazine Palpitations    Irregular heartbeat, fluttering    Past Medical History:  Diagnosis Date  . A-fib ([place])   . Anxiety   . Blood clot in vein 2017  . Cardiomyopathy due to hypertension, without heart failure ([place])   . Congestive dilated cardiomyopathy ([place]) 04/23/2016  . GERD (gastroesophageal reflux disease)   . H/O cardiovascular stress test 2010    [name]  . H/O echocardiogram 2010    mild LVH with some septal dyssynergy, EF 45%, impaired relaxation  . History of prostate cancer   . Hyperlipidemia   . Hypertension   . Left bundle branch block   . NSVT (nonsustained ventricular tachycardia) ([place])   . Palpitations   . Prostate cancer ([place]) 04/02/14   Gleason 7, volume 30 gm  . S/P radiation therapy 06/26/2014 through 08/22/2014                                                      Prostate 7800 cGy in 40 sessions                        . Torn tendon    right shoulder    Blood pressure (!) 156/78, pulse 70.  Congestive dilated cardiomyopathy ([place]) Patient with BP still elevated in office on Entresto 24/26 mg.  Will increase his dose to 49/51 mg twice daily.  He was asked to record home blood pressure readings and return in 3 weeks for follow up.  He can take 2 of the 24/26 mg tablets twice daily until that supply is gone.  At next visit can again up titrate Entresto as allowed and also consider starting spironolactone should his BP tolerate.    Tommy Medal PharmD CPP [facility] [address] [place], [place] [address] [phone]

## 2018-07-06 NOTE — Patient Instructions (Signed)
Return for a a follow up appointment in 3 weeks  Your blood pressure today is 156/78  Check your blood pressure at home as you are able and keep record of the readings.  Take your BP meds as follows:  Increase Entresto to 49/51 mg twice daily.  You can take 2 of the 24/26 mg tablets twice daily until those are gone  Bring all of your meds, your BP cuff and your record of home blood pressures to your next appointment.  Exercise as you're able, try to walk approximately 30 minutes per day.  Keep salt intake to a minimum, especially watch canned and prepared boxed foods.  Eat more fresh fruits and vegetables and fewer canned items.  Avoid eating in fast food restaurants.    HOW TO TAKE YOUR BLOOD PRESSURE: . Rest 5 minutes before taking your blood pressure. .  Don't smoke or drink caffeinated beverages for at least 30 minutes before. . Take your blood pressure before (not after) you eat. . Sit comfortably with your back supported and both feet on the floor (don't cross your legs). . Elevate your arm to heart level on a table or a desk. . Use the proper sized cuff. It should fit smoothly and snugly around your bare upper arm. There should be enough room to slip a fingertip under the cuff. The bottom edge of the cuff should be 1 inch above the crease of the elbow. . Ideally, take 3 measurements at one sitting and record the average.

## 2018-07-07 ENCOUNTER — Encounter: Payer: Self-pay | Admitting: Pharmacist Clinician (PhC)/ Clinical Pharmacy Specialist

## 2018-07-07 ENCOUNTER — Telehealth: Payer: Self-pay | Admitting: Family Medicine

## 2018-07-07 MED ORDER — ISOSORBIDE MONONITRATE ER 30 MG PO TB24: 30.0000 mg | ORAL_TABLET | Freq: Every day | ORAL | 3 refills | Status: DC

## 2018-07-07 NOTE — Telephone Encounter (Signed)
Patient is requesting a refill of his Valtrex, this will Dr. Hershal Coria first time prescribing med since patient establishing with out clinic. Please advise and place order to [facility] on [address] if approved.

## 2018-07-07 NOTE — Assessment & Plan Note (Signed)
Patient with BP still elevated in office on Entresto 24/26 mg.  Will increase his dose to 49/51 mg twice daily.  He was asked to record home blood pressure readings and return in 3 weeks for follow up.  He can take 2 of the 24/26 mg tablets twice daily until that supply is gone.  At next visit can again up titrate Entresto as allowed and also consider starting spironolactone should his BP tolerate.

## 2018-07-10 ENCOUNTER — Other Ambulatory Visit: Payer: Self-pay

## 2018-07-10 MED ORDER — VALACYCLOVIR HCL 1 G PO TABS: ORAL_TABLET | ORAL | 1 refills | Status: DC

## 2018-07-10 NOTE — Telephone Encounter (Signed)
Patient request refill on Valtrex for outbreak of cold sores/fever blisters.  Medication was last filled by pervious provider.  LOV 06/07/2018.  Please review and advise. MPulliam, CMA/RT(R)

## 2018-07-19 ENCOUNTER — Other Ambulatory Visit: Payer: BLUE CROSS/BLUE SHIELD

## 2018-07-19 ENCOUNTER — Ambulatory Visit (INDEPENDENT_AMBULATORY_CARE_PROVIDER_SITE_OTHER): Payer: BLUE CROSS/BLUE SHIELD | Admitting: Family Medicine

## 2018-07-19 ENCOUNTER — Encounter: Payer: Self-pay | Admitting: Family Medicine

## 2018-07-19 ENCOUNTER — Other Ambulatory Visit: Payer: Self-pay

## 2018-07-19 VITALS — BP 128/86 | HR 75 | Resp 17 | Ht 68.5 in | Wt 197.0 lb

## 2018-07-19 DIAGNOSIS — I10 Essential (primary) hypertension: Secondary | ICD-10-CM | POA: Diagnosis not present

## 2018-07-19 DIAGNOSIS — Z0000 Encounter for general adult medical examination without abnormal findings: Secondary | ICD-10-CM | POA: Diagnosis not present

## 2018-07-19 DIAGNOSIS — E785 Hyperlipidemia, unspecified: Secondary | ICD-10-CM

## 2018-07-19 DIAGNOSIS — Z719 Counseling, unspecified: Secondary | ICD-10-CM

## 2018-07-19 MED ORDER — TERBINAFINE HCL 1 % EX CREA: TOPICAL_CREAM | CUTANEOUS | 1 refills | Status: DC
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
## 2018-07-19 NOTE — Patient Instructions (Signed)

## 2018-07-20 LAB — CBC WITH DIFFERENTIAL/PLATELET
Basophils Absolute: 0.1 10*3/uL (ref 0.0–0.2)
Basos: 1 %
EOS (ABSOLUTE): 0.2 10*3/uL (ref 0.0–0.4)
Eos: 4 %
HEMATOCRIT: 45.1 % (ref 37.5–51.0)
Hemoglobin: 15.1 g/dL (ref 13.0–17.7)
IMMATURE GRANULOCYTES: 0 %
Immature Grans (Abs): 0 10*3/uL (ref 0.0–0.1)
LYMPHS ABS: 1.7 10*3/uL (ref 0.7–3.1)
Lymphs: 34 %
MCH: 31.6 pg (ref 26.6–33.0)
MCHC: 33.5 g/dL (ref 31.5–35.7)
MCV: 94 fL (ref 79–97)
MONOS ABS: 0.6 10*3/uL (ref 0.1–0.9)
Monocytes: 12 %
NEUTROS PCT: 49 %
Neutrophils Absolute: 2.4 10*3/uL (ref 1.4–7.0)
Platelets: 194 10*3/uL (ref 150–450)
RBC: 4.78 x10E6/uL (ref 4.14–5.80)
RDW: 13.1 % (ref 12.3–15.4)
WBC: 4.9 10*3/uL (ref 3.4–10.8)

## 2018-07-20 LAB — COMPREHENSIVE METABOLIC PANEL
A/G RATIO: 1.8 (ref 1.2–2.2)
ALK PHOS: 72 IU/L (ref 39–117)
ALT: 19 IU/L (ref 0–44)
AST: 18 IU/L (ref 0–40)
Albumin: 4.4 g/dL (ref 3.6–4.8)
BILIRUBIN TOTAL: 0.5 mg/dL (ref 0.0–1.2)
BUN/Creatinine Ratio: 19 (ref 10–24)
BUN: 16 mg/dL (ref 8–27)
CO2: 24 mmol/L (ref 20–29)
Calcium: 9.2 mg/dL (ref 8.6–10.2)
Chloride: 101 mmol/L (ref 96–106)
Creatinine, Ser: 0.85 mg/dL (ref 0.76–1.27)
GFR calc Af Amer: 107 mL/min/{1.73_m2} (ref >59)
GFR calc non Af Amer: 93 mL/min/{1.73_m2} (ref >59)
GLOBULIN, TOTAL: 2.5 g/dL (ref 1.5–4.5)
Glucose: 98 mg/dL (ref 65–99)
POTASSIUM: 4.6 mmol/L (ref 3.5–5.2)
SODIUM: 139 mmol/L (ref 134–144)
Total Protein: 6.9 g/dL (ref 6.0–8.5)

## 2018-07-20 LAB — T4, FREE: Free T4: 1.67 ng/dL (ref 0.82–1.77)

## 2018-07-20 LAB — LIPID PANEL
Chol/HDL Ratio: 4.9 ratio (ref 0.0–5.0)
Cholesterol, Total: 245 mg/dL — ABNORMAL HIGH (ref 100–199)
HDL: 50 mg/dL (ref >39)
LDL CALC: 155 mg/dL — AB (ref 0–99)
Triglycerides: 198 mg/dL — ABNORMAL HIGH (ref 0–149)
VLDL CHOLESTEROL CAL: 40 mg/dL (ref 5–40)

## 2018-07-20 LAB — HEMOGLOBIN A1C
Est. average glucose Bld gHb Est-mCnc: 117 mg/dL
HEMOGLOBIN A1C: 5.7 % — AB (ref 4.8–5.6)

## 2018-07-20 LAB — VITAMIN D 25 HYDROXY (VIT D DEFICIENCY, FRACTURES): Vit D, 25-Hydroxy: 39.9 ng/mL (ref 30.0–100.0)

## 2018-07-20 LAB — TSH: TSH: 1.06 u[IU]/mL (ref 0.450–4.500)

## 2018-07-27 ENCOUNTER — Telehealth: Payer: Self-pay

## 2018-07-27 ENCOUNTER — Encounter: Payer: Self-pay | Admitting: Family Medicine

## 2018-07-27 ENCOUNTER — Ambulatory Visit: Payer: BLUE CROSS/BLUE SHIELD

## 2018-07-27 DIAGNOSIS — E785 Hyperlipidemia, unspecified: Secondary | ICD-10-CM

## 2018-07-27 DIAGNOSIS — I4892 Unspecified atrial flutter: Secondary | ICD-10-CM

## 2018-07-27 DIAGNOSIS — I10 Essential (primary) hypertension: Secondary | ICD-10-CM

## 2018-07-27 DIAGNOSIS — M7662 Achilles tendinitis, left leg: Secondary | ICD-10-CM | POA: Diagnosis not present

## 2018-07-27 MED ORDER — ROSUVASTATIN CALCIUM 10 MG PO TABS: 10.0000 mg | ORAL_TABLET | Freq: Every day | ORAL | 6 refills | Status: DC

## 2018-07-27 NOTE — Progress Notes (Deleted)
07/27/2018 Dennis Lewis 1955-11-29 [id number]   HPI:  Dennis Lewis is a 62 y.o. male patient of Dr [name], with a [place] below who presents today for heart failure medication titration.  Patient has HFrEF with most recent echo at 35-40%.  He also has a history of DVT, hypertension, hyperlipidemia, LBBB and s/p radiation therapy for prostate cancer.  An echocardiogram last month showed his EF was at 25-30%.  It had been that low back in 2017, but improved to 40-45% in Oct 2017.  Around that time he was found to be in atrial fibrillation and started on Eliquis.  When he saw Dr. [name] last month he was started on Entresto 24/26 mg bid.  He was also asked to reduce the furosemide from 40 mg to 20 mg daily.   Labs drawn since starting show stable electrolytes and kidney function.  Today patient reports feeling well.  He had an outbreak of fever blisters a few days after starting the [place], but that has cleared.    Blood Pressure Goal:  130/80  Current Medications:  Entresto 49/51 mg bid  Metoprolol succ 100 mg - am  Furosemide 40 mg - am  Isosorbide mono - am  Potassium 10 mEq bid  Family Hx:  Father - cancer,   Mother - MVA  MGF - multiple MI's, other grandparents all died from old age  Social Hx:  Had stopped drinking, admits to previously drinking as much as a 12-pack of beer on weekend days              no tobacco since 1980;   Some decaf coffee, no caffeinated sodas  Diet:  Mostly home cooked meals; often banana and cheerios for dinner, sometimes salad (lettuce, carrots);   Exercise:  Walk 4 miles 4 days per week as weather allows  Home BP readings:  No home readings - checks at HF.  Recalls 117/78; highest around 497 systolic  Intolerances:   Peanuts, chocolate, wheat  - causes cold sores  Labs:  9/26:   Na 143, K 4.9, Glu 92, BUN 24, SCr 1.11  10/10: Na 143, K 4.5, Glu 84, BUN 11, SCr 0.92 Wt Readings from Last 3 Encounters:  07/19/18 197 lb (89.4 kg)  06/15/18  194 lb 6.4 oz (88.2 kg)  06/07/18 195 lb (88.5 kg)   BP Readings from Last 3 Encounters:  07/19/18 128/86  07/06/18 (!) 156/78  06/15/18 138/82   Pulse Readings from Last 3 Encounters:  07/19/18 75  07/06/18 70  06/15/18 77    Current Outpatient Medications  Medication Sig Dispense Refill  . ELIQUIS 5 MG TABS tablet TAKE 1 TABLET BY MOUTH TWICE DAILY 120 tablet 2  . furosemide (LASIX) 20 MG tablet Take 2 tablets (40 mg total) by mouth daily. KEEP OV. 180 tablet 0  . isosorbide mononitrate (IMDUR) 30 MG 24 hr tablet Take 1 tablet (30 mg total) by mouth daily. 90 tablet 3  . KLOR-CON 10 10 MEQ tablet TAKE 1 TABLET BY MOUTH TWICE A DAY 60 tablet 6  . metoprolol succinate (TOPROL-XL) 100 MG 24 hr tablet Take 1 tablet (100 mg total) by mouth daily. 30 tablet 9  . nitroGLYCERIN (NITROSTAT) 0.4 MG SL tablet Place 1 tablet under the tongue as needed. X 3 doses    . potassium chloride (K-DUR,KLOR-CON) 10 MEQ tablet TAKE 1 TABLET BY MOUTH TWICE DAILY 60 tablet 3  . sacubitril-valsartan (ENTRESTO) 24-26 MG Take 1 tablet by mouth 2 (  two) times daily. 60 tablet 0  . terbinafine (LAMISIL) 1 % cream Apply to affected area twice daily until rash gone, then apply 2 more days. (for tinea corporis) 30 g 1  . valACYclovir (VALTREX) 1000 MG tablet At first onset symptoms take 1 tab p.o. daily x3 days 9 tablet 1   No current facility-administered medications for this visit.     Allergies  Allergen Reactions  . Ace Inhibitors Other (See Comments)    Cold sores   . Chocolate Other (See Comments)    Blisters   . Other Other (See Comments)    Seed on bread causes lip blisters Anesthesia also, but does nor remember which one  . Peanut-Containing Drug Products Other (See Comments)    Blisters  . Hydralazine Palpitations    Irregular heartbeat, fluttering    Past Medical History:  Diagnosis Date  . A-fib ([place])   . Anxiety   . Blood clot in vein 2017  . Cardiomyopathy due to hypertension,  without heart failure ([place])   . Congestive dilated cardiomyopathy ([place]) 04/23/2016  . GERD (gastroesophageal reflux disease)   . H/O cardiovascular stress test 2010   [name]  . H/O echocardiogram 2010    mild LVH with some septal dyssynergy, EF 45%, impaired relaxation  . History of prostate cancer   . Hyperlipidemia   . Hypertension   . Left bundle branch block   . NSVT (nonsustained ventricular tachycardia) ([place])   . Palpitations   . Prostate cancer ([place]) 04/02/14   Gleason 7, volume 30 gm  . S/P radiation therapy 06/26/2014 through 08/22/2014                                                      Prostate 7800 cGy in 40 sessions                        . Torn tendon    right shoulder    There were no vitals taken for this visit.  No problem-specific Assessment & Plan notes found for this encounter.   Tommy Medal PharmD CPP [facility] [address] [place], [place] [address] [phone]

## 2018-07-27 NOTE — Telephone Encounter (Signed)
Dr.Jordan reviewed labs from PCP.Advised to start Crestor 10 mg daily.Repeat fasting lipid and hepatic panels in 3 months.Lab orders mailed.

## 2018-08-01 ENCOUNTER — Encounter: Payer: Self-pay | Admitting: Pharmacist Clinician (PhC)/ Clinical Pharmacy Specialist

## 2018-08-01 ENCOUNTER — Ambulatory Visit (INDEPENDENT_AMBULATORY_CARE_PROVIDER_SITE_OTHER): Payer: BLUE CROSS/BLUE SHIELD | Admitting: Pharmacist Clinician (PhC)/ Clinical Pharmacy Specialist

## 2018-08-01 DIAGNOSIS — I420 Dilated cardiomyopathy: Secondary | ICD-10-CM | POA: Diagnosis not present

## 2018-08-01 MED ORDER — SACUBITRIL-VALSARTAN 97-103 MG PO TABS: 1.0000 | ORAL_TABLET | Freq: Two times a day (BID) | ORAL | 5 refills | Status: DC

## 2018-08-01 MED ORDER — POTASSIUM CHLORIDE ER 10 MEQ PO TBCR: 10.0000 meq | EXTENDED_RELEASE_TABLET | Freq: Every day | ORAL | 3 refills | Status: DC

## 2018-08-01 NOTE — Progress Notes (Signed)
08/01/2018 Dennis Lewis [birth year] [id number]   HPI:  Dennis Lewis is a 61 y.o. male patient of Dr [name], with a [place] below who presents today for heart failure medication titration.  Patient has HFrEF with most recent echo at 35-40%.  He also has a history of DVT, hypertension, hyperlipidemia, LBBB and s/p radiation therapy for prostate cancer.  An echocardiogram last month showed his EF was again low at 25-30%.  It had been that low back in 2017, but improved to 40-45% in Oct 2017.  Around that time he was found to be in atrial fibrillation and started on Eliquis.  When he saw Dr. [name] last month he was started on Entresto 24/26 mg bid.  He was also asked to reduce the furosemide from 40 mg to 20 mg daily.   Labs drawn since starting show stable electrolytes and kidney function.  He was then increased to 49/51 mg twice daily and is here today for follow up.   Again he reports to feeling well.  Does not have a home BP cuff, but has been checking a few times a week at his local [employer] store (see below).  His only concern today is a problem with his Achilles tendon and he comes in limping.  It is causing him some pain, but he is managing it with just acetaminophen at this time.  No complaints of dizziness, lightheadedness, chest pain, shortness of breath or edema.    Blood Pressure Goal:  130/80  Current Medications:  Entresto 49/51 mg bid  Metoprolol succ 100 mg - am  Furosemide 40 mg - am  Isosorbide mono - am  Potassium 10 mEq bid  Family Hx:  Father - cancer,   Mother - MVA  MGF - multiple MI's, other grandparents all died from old age  Social Hx:  Had stopped drinking, admits to previously drinking as much as a 12-pack of beer on weekend days              no tobacco since 1980;   Some decaf coffee, no caffeinated sodas  Diet:  Mostly home cooked meals; often banana and cheerios for dinner, sometimes salad (lettuce, carrots);   Exercise:  Walk 4 miles 4 days per  week as weather allows (unable this week due to Achilles tendon problem)  Home BP readings:  Did not bring list of readings.  States he had been taking pictures of the results, but recently deleted them from his phone.  Recalls one reading as low as 98 systolic, but mostly 213-086 range.    Intolerances:   Peanuts, chocolate, wheat  - causes cold sores  Labs:  9/26:   Na 143, K 4.9, Glu 92, BUN 24, SCr 1.11  10/10: Na 143, K 4.5, Glu 84, BUN 11, SCr 0.92 Wt Readings from Last 3 Encounters:  07/19/18 197 lb (89.4 kg)  06/15/18 194 lb 6.4 oz (88.2 kg)  06/07/18 195 lb (88.5 kg)   BP Readings from Last 3 Encounters:  08/01/18 (!) 144/90  07/19/18 128/86  07/06/18 (!) 156/78   Pulse Readings from Last 3 Encounters:  08/01/18 80  07/19/18 75  07/06/18 70    Current Outpatient Medications  Medication Sig Dispense Refill  . ELIQUIS 5 MG TABS tablet TAKE 1 TABLET BY MOUTH TWICE DAILY 120 tablet 2  . furosemide (LASIX) 20 MG tablet Take 2 tablets (40 mg total) by mouth daily. KEEP OV. 180 tablet 0  . isosorbide mononitrate (IMDUR)  30 MG 24 hr tablet Take 1 tablet (30 mg total) by mouth daily. 90 tablet 3  . metoprolol succinate (TOPROL-XL) 100 MG 24 hr tablet Take 1 tablet (100 mg total) by mouth daily. 30 tablet 9  . nitroGLYCERIN (NITROSTAT) 0.4 MG SL tablet Place 1 tablet under the tongue as needed. X 3 doses    . potassium chloride (K-DUR) 10 MEQ tablet Take 1 tablet (10 mEq total) by mouth daily. 90 tablet 3  . potassium chloride (K-DUR,KLOR-CON) 10 MEQ tablet TAKE 1 TABLET BY MOUTH TWICE DAILY 60 tablet 3  . rosuvastatin (CRESTOR) 10 MG tablet Take 1 tablet (10 mg total) by mouth daily. 30 tablet 6  . sacubitril-valsartan (ENTRESTO) 97-103 MG Take 1 tablet by mouth 2 (two) times daily. 60 tablet 5  . terbinafine (LAMISIL) 1 % cream Apply to affected area twice daily until rash gone, then apply 2 more days. (for tinea corporis) 30 g 1  . valACYclovir (VALTREX) 1000 MG tablet At  first onset symptoms take 1 tab p.o. daily x3 days 9 tablet 1   No current facility-administered medications for this visit.     Allergies  Allergen Reactions  . Ace Inhibitors Other (See Comments)    Cold sores   . Chocolate Other (See Comments)    Blisters   . Other Other (See Comments)    Seed on bread causes lip blisters Anesthesia also, but does nor remember which one  . Peanut-Containing Drug Products Other (See Comments)    Blisters  . Hydralazine Palpitations    Irregular heartbeat, fluttering    Past Medical History:  Diagnosis Date  . A-fib ([place])   . Anxiety   . Blood clot in vein 2017  . Cardiomyopathy due to hypertension, without heart failure ([place])   . 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 GERD (gastroesophageal reflux disease)   . H/O cardiovascular stress test 2010   [name]  . H/O echocardiogram 2010    mild LVH with some septal dyssynergy, EF 45%, impaired relaxation  . History of prostate cancer   . Hyperlipidemia   . Hypertension   . Left bundle branch block   . NSVT (nonsustained ventricular tachycardia) ([place])   . Palpitations   . Prostate cancer ([place]) 04/02/14   Gleason 7, volume 30 gm  . S/P radiation therapy 06/26/2014 through 08/22/2014                                                      Prostate 7800 cGy in 40 sessions                        . Torn tendon    right shoulder    Blood pressure (!) 144/90, pulse 80.  Congestive dilated cardiomyopathy ([place]) Patient with HFrEF, doing well on Entresto.  Although his out of office blood pressure readings are apparently lower that what we are getting in the office, I am going to increase the Entresto to the 97/103 mg dose.  He was given a 30 day free trial card, as well as a copay card for future fills.  I have asked that he continue to monitor his BP and should he develop any hypotensive symptoms he should call us right away.  I have also decreased his potassium to once daily  ,  as on his last visit we decreased the furosemide dose.  We may still be able to add spironolactone at a future visit.  We will see him again in one month, he was asked to write down his BP readings and bring them to the office.    Tommy Medal PharmD CPP [facility] [address] [place], [place] [address] [phone]

## 2018-08-01 NOTE — Patient Instructions (Addendum)
Return for a a follow up appointment in one month  Your blood pressure today is 144/90  Check your blood pressure at home as able and keep record of the readings.  Take your BP meds as follows:  Increase Entresto to 97/103 twice daily.  Decrease potassium 10 mEq to once daily  Continue with all other medication  Bring all of your meds, your BP cuff and your record of home blood pressures to your next appointment.  Exercise as you're able, try to walk approximately 30 minutes per day.  Keep salt intake to a minimum, especially watch canned and prepared boxed foods.  Eat more fresh fruits and vegetables and fewer canned items.  Avoid eating in fast food restaurants.    HOW TO TAKE YOUR BLOOD PRESSURE: . Rest 5 minutes before taking your blood pressure. .  Don't smoke or drink caffeinated beverages for at least 30 minutes before. . Take your blood pressure before (not after) you eat. . Sit comfortably with your back supported and both feet on the floor (don't cross your legs). . Elevate your arm to heart level on a table or a desk. . Use the proper sized cuff. It should fit smoothly and snugly around your bare upper arm. There should be enough room to slip a fingertip under the cuff. The bottom edge of the cuff should be 1 inch above the crease of the elbow. . Ideally, take 3 measurements at one sitting and record the average.

## 2018-08-01 NOTE — Assessment & Plan Note (Signed)
Patient with HFrEF, doing well on Entresto.  Although his out of office blood pressure readings are apparently lower that what we are getting in the office, I am going to increase the Entresto to the 97/103 mg dose.  He was given a 30 day free trial card, as well as a copay card for future fills.  I have asked that he continue to monitor his BP and should he develop any hypotensive symptoms he should call us right away.  I have also decreased his potassium to once daily , as on his last visit we decreased the furosemide dose.  We may still be able to add spironolactone at a future visit.  We will see him again in one month, he was asked to write down his BP readings and bring them to the office.

## 2018-08-02 ENCOUNTER — Encounter: Payer: Self-pay | Admitting: Cardiology

## 2018-08-02 ENCOUNTER — Telehealth: Payer: Self-pay

## 2018-08-02 MED ORDER — SACUBITRIL-VALSARTAN 97-103 MG PO TABS: 1.0000 | ORAL_TABLET | Freq: Two times a day (BID) | ORAL | 5 refills | Status: DC

## 2018-08-02 NOTE — Telephone Encounter (Signed)
Patient's wife walked in office wanting samples of Entresto 97/103 mg.Advised they do not sample 97/103 mg tablet.[facility] not [facility] by husband's [facility].Sav[employer] card was given to wife and prescription sent to [facility] at [facility].

## 2018-08-10 DIAGNOSIS — M7661 Achilles tendinitis, right leg: Secondary | ICD-10-CM | POA: Diagnosis not present

## 2018-08-10 DIAGNOSIS — M79672 Pain in left foot: Secondary | ICD-10-CM | POA: Diagnosis not present

## 2018-09-03 ENCOUNTER — Other Ambulatory Visit: Payer: Self-pay | Admitting: Cardiology

## 2018-09-07 ENCOUNTER — Ambulatory Visit: Payer: BLUE CROSS/BLUE SHIELD

## 2018-09-11 ENCOUNTER — Telehealth: Payer: Self-pay | Admitting: Cardiology

## 2018-09-11 ENCOUNTER — Other Ambulatory Visit: Payer: Self-pay | Admitting: *Deleted

## 2018-09-11 DIAGNOSIS — R002 Palpitations: Secondary | ICD-10-CM

## 2018-09-11 MED ORDER — METOPROLOL SUCCINATE ER 100 MG PO TB24: 100.0000 mg | ORAL_TABLET | Freq: Every day | ORAL | 1 refills | Status: DC

## 2018-09-11 MED ORDER — METOPROLOL SUCCINATE ER 100 MG PO TB24: 100.0000 mg | ORAL_TABLET | Freq: Every day | ORAL | 6 refills | Status: DC

## 2018-09-11 NOTE — Telephone Encounter (Signed)
New Message    *STAT* If patient is at the pharmacy, call can be transferred to refill team.   1. Which medications need to be refilled? (please list name of each medication and dose if known) Metoprolol  2. Which pharmacy/location (including street and city if local pharmacy) is medication to be sent to? [facility] [address] in [place]  3. Do they need a 30 day or 90 day supply? 90 Day

## 2018-09-16 ENCOUNTER — Observation Stay (HOSPITAL_COMMUNITY)
Admission: EM | Admit: 2018-09-16 | Discharge: 2018-09-17 | Disposition: A | Discharge status: Home / Self Care | Payer: BLUE CROSS/BLUE SHIELD | Attending: Family Medicine | Admitting: Family Medicine

## 2018-09-16 ENCOUNTER — Emergency Department (HOSPITAL_COMMUNITY): Payer: BLUE CROSS/BLUE SHIELD

## 2018-09-16 ENCOUNTER — Other Ambulatory Visit: Payer: Self-pay

## 2018-09-16 DIAGNOSIS — I4892 Unspecified atrial flutter: Secondary | ICD-10-CM | POA: Diagnosis not present

## 2018-09-16 DIAGNOSIS — I110 Hypertensive heart disease with heart failure: Secondary | ICD-10-CM | POA: Insufficient documentation

## 2018-09-16 DIAGNOSIS — J90 Pleural effusion, not elsewhere classified: Secondary | ICD-10-CM | POA: Insufficient documentation

## 2018-09-16 DIAGNOSIS — I10 Essential (primary) hypertension: Secondary | ICD-10-CM | POA: Diagnosis present

## 2018-09-16 DIAGNOSIS — K219 Gastro-esophageal reflux disease without esophagitis: Secondary | ICD-10-CM | POA: Insufficient documentation

## 2018-09-16 DIAGNOSIS — F419 Anxiety disorder, unspecified: Secondary | ICD-10-CM | POA: Diagnosis not present

## 2018-09-16 DIAGNOSIS — Z87891 Personal history of nicotine dependence: Secondary | ICD-10-CM | POA: Insufficient documentation

## 2018-09-16 DIAGNOSIS — I5023 Acute on chronic systolic (congestive) heart failure: Secondary | ICD-10-CM | POA: Diagnosis present

## 2018-09-16 DIAGNOSIS — E785 Hyperlipidemia, unspecified: Secondary | ICD-10-CM | POA: Insufficient documentation

## 2018-09-16 DIAGNOSIS — Z789 Other specified health status: Secondary | ICD-10-CM

## 2018-09-16 DIAGNOSIS — I4891 Unspecified atrial fibrillation: Principal | ICD-10-CM | POA: Diagnosis present

## 2018-09-16 DIAGNOSIS — I420 Dilated cardiomyopathy: Secondary | ICD-10-CM | POA: Diagnosis present

## 2018-09-16 DIAGNOSIS — Z923 Personal history of irradiation: Secondary | ICD-10-CM | POA: Diagnosis not present

## 2018-09-16 DIAGNOSIS — I447 Left bundle-branch block, unspecified: Secondary | ICD-10-CM | POA: Diagnosis not present

## 2018-09-16 DIAGNOSIS — Z7901 Long term (current) use of anticoagulants: Secondary | ICD-10-CM | POA: Insufficient documentation

## 2018-09-16 DIAGNOSIS — R0602 Shortness of breath: Secondary | ICD-10-CM | POA: Diagnosis not present

## 2018-09-16 DIAGNOSIS — M109 Gout, unspecified: Secondary | ICD-10-CM | POA: Diagnosis not present

## 2018-09-16 DIAGNOSIS — I5043 Acute on chronic combined systolic (congestive) and diastolic (congestive) heart failure: Secondary | ICD-10-CM | POA: Insufficient documentation

## 2018-09-16 LAB — BASIC METABOLIC PANEL
Anion gap: 9 (ref 5–15)
BUN: 14 mg/dL (ref 8–23)
CO2: 27 mmol/L (ref 22–32)
CREATININE: 1.01 mg/dL (ref 0.61–1.24)
Calcium: 9.2 mg/dL (ref 8.9–10.3)
Chloride: 104 mmol/L (ref 98–111)
GFR calc Af Amer: >60 mL/min (ref >60)
GFR calc non Af Amer: >60 mL/min (ref >60)
Glucose, Bld: 111 mg/dL — ABNORMAL HIGH (ref 70–99)
Potassium: 4.4 mmol/L (ref 3.5–5.1)
Sodium: 140 mmol/L (ref 135–145)

## 2018-09-16 LAB — CBC
HCT: 46 % (ref 39.0–52.0)
Hemoglobin: 15 g/dL (ref 13.0–17.0)
MCH: 31.4 pg (ref 26.0–34.0)
MCHC: 32.6 g/dL (ref 30.0–36.0)
MCV: 96.4 fL (ref 80.0–100.0)
Platelets: 203 10*3/uL (ref 150–400)
RBC: 4.77 MIL/uL (ref 4.22–5.81)
RDW: 13.9 % (ref 11.5–15.5)
WBC: 8.2 10*3/uL (ref 4.0–10.5)
nRBC: 0 % (ref 0.0–0.2)

## 2018-09-16 LAB — I-STAT TROPONIN, ED: Troponin i, poc: 0 ng/mL (ref 0.00–0.08)

## 2018-09-16 LAB — BRAIN NATRIURETIC PEPTIDE: B Natriuretic Peptide: 1651.2 pg/mL — ABNORMAL HIGH (ref 0.0–100.0)

## 2018-09-16 MED ORDER — FUROSEMIDE 10 MG/ML IJ SOLN
40.0000 mg | Freq: Once | INTRAMUSCULAR | Status: AC
  Administered 2018-09-16: 40 mg via INTRAVENOUS
  Filled 2018-09-16: qty 4

## 2018-09-16 MED ORDER — DILTIAZEM HCL-DEXTROSE 100-5 MG/100ML-% IV SOLN (PREMIX)
5.0000 mg/h | INTRAVENOUS | Status: DC
  Administered 2018-09-16: 5 mg/h via INTRAVENOUS
  Filled 2018-09-16: qty 100

## 2018-09-16 MED ORDER — DILTIAZEM LOAD VIA INFUSION
15.0000 mg | Freq: Once | INTRAVENOUS | Status: AC
  Administered 2018-09-16: 15 mg via INTRAVENOUS
  Filled 2018-09-16: qty 15

## 2018-09-16 NOTE — ED Triage Notes (Signed)
Patient c/o SOB that began last night, worsening today; c/o a bloated feeling.

## 2018-09-16 NOTE — ED Provider Notes (Signed)
[facility] EMERGENCY DEPARTMENT Provider Note   CSN: [id number] Arrival date & time: 09/16/18  2038     History   Chief Complaint Chief Complaint  Patient presents with  . Shortness of Breath    HPI Dennis Lewis is a 62 y.o. male.  HPI   62 year old male with history of A. fib, systolic heart failure, here with shortness of breath and palpitations.  The patient states that he was in his usual state of health until this afternoon.  He began to notice abdominal bloating and sensation his heart was beating quickly and irregularly.  He then began to develop shortness of breath and mild, dry, nonproductive cough.  He has had associated severe dyspnea with any kind of exertion.  He said palpitations.  Feels like his heart is beating very quickly.  Denies any preceding symptoms.  No chest pain.  He does note that he has been on multiple courses of prednisone recently for gout, for at least several weeks.  He just completed this approximately a week ago.  No other complaints. No alleviating factors.  Past Medical History:  Diagnosis Date  . A-fib ([place])   . Anxiety   . Blood clot in vein 2017  . Cardiomyopathy due to hypertension, without heart failure ([place])   . 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 GERD (gastroesophageal reflux disease)   . H/O cardiovascular stress test 2010   [name]  . H/O echocardiogram 2010    mild LVH with some septal dyssynergy, EF 45%, impaired relaxation  . History of prostate cancer   . Hyperlipidemia   . Hypertension   . Left bundle branch block   . NSVT (nonsustained ventricular tachycardia) ([place])   . Palpitations   . Prostate cancer ([place]) 04/02/14   Gleason 7, volume 30 gm  . S/P radiation therapy 06/26/2014 through 08/22/2014                                                      Prostate 7800 cGy in 40 sessions                        . Torn tendon    right shoulder    Patient Active Problem List   Diagnosis Date Noted  . Alcohol consumption heavy- 4-6 beer/d many yrs 07/02/2018  . HLD (hyperlipidemia) 06/07/2018  . GERD (gastroesophageal reflux disease) 06/07/2018  . S/P radiation therapy for prostate CA 06/07/2018  . Atrial flutter ([place]) 08/26/2016  . 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 Chest pain   . Elevated troponin   . 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 Hypertensive heart disease 04/23/2016  . NSVT (nonsustained ventricular tachycardia) ([place]) 08/21/2015  . Malignant neoplasm of prostate ([place]) 05/14/2014  . Palpitations 07/19/2013  . Left bundle branch block 07/19/2013  . Essential hypertension 07/19/2013    Past Surgical History:  Procedure Laterality Date  . BACK SURGERY  1993  . CARDIAC CATHETERIZATION  1990   Normal cors  . CARDIAC CATHETERIZATION N/A 06/06/2016   Procedure: Left Heart Cath and Coronary Angiography;  Surgeon: Jettie Booze, MD;  Location: [facility];  Service: Cardiovascular;  Laterality: N/A;  . PROSTATE BIOPSY  04/02/14   Gleason 7, vol 30 gm        Home  Medications    Prior to Admission medications   Medication Sig Start Date End Date Taking? Authorizing Provider  ELIQUIS 5 MG TABS tablet TAKE 1 TABLET BY MOUTH TWICE DAILY Patient taking differently: Take 5 mg by mouth 2 (two) times daily.  05/02/18  Yes [name], [name], MD  furosemide (LASIX) 20 MG tablet Take 2 tablets (40 mg total) by mouth daily. KEEP OV. Patient taking differently: Take 20 mg by mouth daily.  06/12/18  Yes [name], [name], MD  isosorbide mononitrate (IMDUR) 30 MG 24 hr tablet Take 1 tablet (30 mg total) by mouth daily. ** DO NOT CRUSH ** 09/04/18  Yes [name], [name], MD  metoprolol succinate (TOPROL-XL) 100 MG 24 hr tablet Take 1 tablet (100 mg total) by mouth daily. 09/11/18  Yes [name], [name], MD  nitroGLYCERIN (NITROSTAT) 0.4 MG SL tablet Place 1 tablet under the tongue every 5 (five) minutes as needed for chest pain. X 3 doses  07/05/16  Yes [provider]  potassium chloride (K-DUR) 10 MEQ tablet Take 1 tablet (10 mEq total) by mouth daily. 08/01/18 10/30/18 Yes [name], [name], MD  sacubitril-valsartan (ENTRESTO) 97-103 MG Take 1 tablet by mouth 2 (two) times daily. 08/02/18  Yes [name], [name], MD  potassium chloride (K-DUR,KLOR-CON) 10 MEQ tablet TAKE 1 TABLET BY MOUTH TWICE DAILY Patient not taking: Reported on 09/17/2018 07/05/18   [name], [name], MD  rosuvastatin (CRESTOR) 10 MG tablet Take 1 tablet (10 mg total) by mouth daily. Patient not taking: Reported on 09/17/2018 07/27/18 10/25/18  [name], [name], MD  terbinafine (LAMISIL) 1 % cream Apply to affected area twice daily until rash gone, then apply 2 more days. (for tinea corporis) Patient not taking: Reported on 09/17/2018 07/19/18   [name], DO  valACYclovir (VALTREX) 1000 MG tablet At first onset symptoms take 1 tab p.o. daily x3 days Patient not taking: Reported on 09/17/2018 07/10/18   [name], DO    Family History Family History  Problem Relation Age of Onset  . Throat cancer Father 73       smoker  . Esophageal cancer Father   . Prostate cancer Brother        surgery  . Colon cancer Neg Hx   . Diabetes Neg Hx   . Colon polyps Neg Hx     Social History Social History   Tobacco Use  . Smoking status: Former Smoker    Types: Cigarettes    Last attempt to quit: 02/22/1979    Years since quitting: 39.5  . Smokeless tobacco: Never Used  Substance Use Topics  . Alcohol use: Yes    Alcohol/week: 28.0 standard drinks    Types: 28 Cans of beer per week    Comment: beer daily - 4 daily  . Drug use: No     Allergies   Ace inhibitors; Chocolate; Other; Peanut-containing drug products; and Hydralazine   Review of Systems Review of Systems  Constitutional: Positive for fatigue. Negative for chills and fever.  HENT: Negative for congestion and rhinorrhea.   Eyes: Negative for visual disturbance.  Respiratory:  Positive for cough and shortness of breath. Negative for wheezing.   Cardiovascular: Positive for palpitations. Negative for chest pain and leg swelling.  Gastrointestinal: Positive for abdominal distention. Negative for abdominal pain, diarrhea, nausea and vomiting.  Genitourinary: Negative for dysuria and flank pain.  Musculoskeletal: Negative for neck pain and neck stiffness.  Skin: Negative for rash and wound.  Allergic/Immunologic: Negative for  immunocompromised state.  Neurological: Negative for syncope, weakness and headaches.  All other systems reviewed and are negative.    Physical Exam Updated Vital Signs BP (!) 150/95   Pulse (!) 112   Temp 97.7 F (36.5 C)   Resp (!) 25   Ht 5\' 9"  (1.753 m)   Wt 84.8 kg   SpO2 98%   BMI 27.62 kg/m   Physical Exam Vitals signs and nursing note reviewed.  Constitutional:      General: He is not in acute distress.    Appearance: He is well-developed.  HENT:     Head: Normocephalic and atraumatic.  Eyes:     Conjunctiva/sclera: Conjunctivae normal.  Neck:     Musculoskeletal: Neck supple.  Cardiovascular:     Rate and Rhythm: Tachycardia present. Rhythm irregularly irregular.     Heart sounds: Normal heart sounds. No murmur. No friction rub.  Pulmonary:     Effort: Pulmonary effort is normal. Tachypnea present. No respiratory distress.     Breath sounds: Examination of the right-lower field reveals rales. Examination of the left-lower field reveals rales. Rales present. No wheezing.  Abdominal:     General: There is no distension.     Palpations: Abdomen is soft.     Tenderness: There is no abdominal tenderness.  Skin:    General: Skin is warm.     Capillary Refill: Capillary refill takes less than 2 seconds.  Neurological:     Mental Status: He is alert and oriented to person, place, and time.     Motor: No abnormal muscle tone.      ED Treatments / Results  Labs (all labs ordered are listed, but only abnormal  results are displayed) Labs Reviewed  BASIC METABOLIC PANEL - Abnormal; Notable for the following components:      Result Value   Glucose, Bld 111 (*)    All other components within normal limits  BRAIN NATRIURETIC PEPTIDE - Abnormal; Notable for the following components:   B Natriuretic Peptide 1,651.2 (*)    All other components within normal limits  CBC  I-STAT TROPONIN, ED    EKG None  Radiology Dg Chest 2 View  Result Date: 09/16/2018 CLINICAL DATA:  Shortness of breath starting last night EXAM: CHEST - 2 VIEW COMPARISON:  June 06, 2016 FINDINGS: The mediastinal contour is normal. Heart size is mildly enlarged. Increased pulmonary interstitium is identified bilaterally. There is no focal pneumonia. Minimal left pleural effusion is noted. The visualized skeletal structures are stable. IMPRESSION: Mild cardiomegaly with mild interstitial edema. Electronically Signed   By: Abelardo Diesel M.D.   On: 09/16/2018 21:53    Procedures Procedures (including critical care time)  Medications Ordered in ED Medications  diltiazem (CARDIZEM) 1 mg/mL load via infusion 15 mg (15 mg Intravenous Bolus from Bag 09/16/18 2354)    And  diltiazem (CARDIZEM) 100 mg in dextrose 5% 16mL (1 mg/mL) infusion (5 mg/hr Intravenous New Bag/Given 09/16/18 2350)  furosemide (LASIX) injection 40 mg (40 mg Intravenous Given 09/16/18 2347)     Initial Impression / Assessment and Plan / ED Course  I have reviewed the triage vital signs and the nursing notes.  Pertinent labs & imaging results that were available during my care of the patient were reviewed by me and considered in my medical decision making (see chart for details).     62 yo M here w/ SOB. On exam, he is in AFib RVR with bilateral rales concerning for CHF. I suspect  this is acute on chronic CHF, possibly 2/2 Afib in setting of prednisone use. He has no CP, no signs of ischemia clinically. Pt given IV Lasix and started on dilt gtt. Admit  to medicine. He does admit he has not been taking his anticoag regularly so will hold on cardioversion.  Final Clinical Impressions(s) / ED Diagnoses   Final diagnoses:  Acute on chronic systolic congestive heart failure (HCC)  Atrial fibrillation with rapid ventricular response [facility])    ED Discharge Orders    None       Duffy Bruce, MD 09/17/18 [phone]

## 2018-09-17 ENCOUNTER — Observation Stay (HOSPITAL_BASED_OUTPATIENT_CLINIC_OR_DEPARTMENT_OTHER): Payer: BLUE CROSS/BLUE SHIELD

## 2018-09-17 DIAGNOSIS — I5023 Acute on chronic systolic (congestive) heart failure: Secondary | ICD-10-CM | POA: Diagnosis present

## 2018-09-17 DIAGNOSIS — I4891 Unspecified atrial fibrillation: Secondary | ICD-10-CM | POA: Diagnosis not present

## 2018-09-17 DIAGNOSIS — I361 Nonrheumatic tricuspid (valve) insufficiency: Secondary | ICD-10-CM

## 2018-09-17 LAB — BASIC METABOLIC PANEL
Anion gap: 10 (ref 5–15)
BUN: 12 mg/dL (ref 8–23)
CO2: 25 mmol/L (ref 22–32)
Calcium: 9.3 mg/dL (ref 8.9–10.3)
Chloride: 106 mmol/L (ref 98–111)
Creatinine, Ser: 0.87 mg/dL (ref 0.61–1.24)
GFR calc Af Amer: >60 mL/min (ref >60)
GFR calc non Af Amer: >60 mL/min (ref >60)
Glucose, Bld: 110 mg/dL — ABNORMAL HIGH (ref 70–99)
POTASSIUM: 3.9 mmol/L (ref 3.5–5.1)
Sodium: 141 mmol/L (ref 135–145)

## 2018-09-17 LAB — ECHOCARDIOGRAM COMPLETE
Height: 69 in
Weight: 2992 oz

## 2018-09-17 MED ORDER — LORAZEPAM 2 MG/ML IJ SOLN: 1.0000 mg | Freq: Four times a day (QID) | INTRAMUSCULAR | Status: DC | PRN

## 2018-09-17 MED ORDER — ACETAMINOPHEN 325 MG PO TABS: 650.0000 mg | ORAL_TABLET | ORAL | Status: DC | PRN

## 2018-09-17 MED ORDER — THIAMINE HCL 100 MG/ML IJ SOLN
100.0000 mg | Freq: Every day | INTRAMUSCULAR | Status: DC
  Filled 2018-09-17: qty 2

## 2018-09-17 MED ORDER — METOPROLOL SUCCINATE ER 100 MG PO TB24
100.0000 mg | ORAL_TABLET | Freq: Every day | ORAL | Status: DC
  Administered 2018-09-17: 100 mg via ORAL
  Filled 2018-09-17: qty 1

## 2018-09-17 MED ORDER — ADULT MULTIVITAMIN W/MINERALS CH
1.0000 | ORAL_TABLET | Freq: Every day | ORAL | Status: DC
  Administered 2018-09-17: 1 via ORAL
  Filled 2018-09-17: qty 1

## 2018-09-17 MED ORDER — ONDANSETRON HCL 4 MG/2ML IJ SOLN: 4.0000 mg | Freq: Four times a day (QID) | INTRAMUSCULAR | Status: DC | PRN

## 2018-09-17 MED ORDER — LORAZEPAM 1 MG PO TABS: 1.0000 mg | ORAL_TABLET | Freq: Four times a day (QID) | ORAL | Status: DC | PRN

## 2018-09-17 MED ORDER — FUROSEMIDE 10 MG/ML IJ SOLN
40.0000 mg | Freq: Every day | INTRAMUSCULAR | Status: DC
  Administered 2018-09-17: 40 mg via INTRAVENOUS
  Filled 2018-09-17: qty 4

## 2018-09-17 MED ORDER — FOLIC ACID 1 MG PO TABS
1.0000 mg | ORAL_TABLET | Freq: Every day | ORAL | Status: DC
  Administered 2018-09-17: 1 mg via ORAL
  Filled 2018-09-17: qty 1

## 2018-09-17 MED ORDER — FUROSEMIDE 20 MG PO TABS: 20.0000 mg | ORAL_TABLET | Freq: Every day | ORAL | Status: DC

## 2018-09-17 MED ORDER — APIXABAN 5 MG PO TABS
5.0000 mg | ORAL_TABLET | Freq: Two times a day (BID) | ORAL | Status: DC
  Administered 2018-09-17: 5 mg via ORAL
  Filled 2018-09-17 (×2): qty 1

## 2018-09-17 MED ORDER — VITAMIN B-1 100 MG PO TABS
100.0000 mg | ORAL_TABLET | Freq: Every day | ORAL | Status: DC
  Administered 2018-09-17: 100 mg via ORAL
  Filled 2018-09-17: qty 1

## 2018-09-17 MED ORDER — ISOSORBIDE MONONITRATE ER 30 MG PO TB24
30.0000 mg | ORAL_TABLET | Freq: Every day | ORAL | Status: DC
  Administered 2018-09-17: 30 mg via ORAL
  Filled 2018-09-17: qty 1

## 2018-09-17 MED ORDER — POTASSIUM CHLORIDE CRYS ER 10 MEQ PO TBCR
10.0000 meq | EXTENDED_RELEASE_TABLET | Freq: Every day | ORAL | Status: DC
  Administered 2018-09-17: 10 meq via ORAL
  Filled 2018-09-17: qty 1

## 2018-09-17 MED ORDER — SACUBITRIL-VALSARTAN 97-103 MG PO TABS
1.0000 | ORAL_TABLET | Freq: Two times a day (BID) | ORAL | Status: DC
  Administered 2018-09-17: 1 via ORAL
  Filled 2018-09-17 (×2): qty 1

## 2018-09-17 NOTE — Discharge Instructions (Signed)

## 2018-09-17 NOTE — Progress Notes (Signed)
Per order set cardizem drip discontinued pt tolerating well no chest pain.

## 2018-09-17 NOTE — Progress Notes (Signed)
Patient given discharge instructions and follow up appointment details. Patient being discharged home with wife. IV was removed and intact upon removal.

## 2018-09-17 NOTE — Progress Notes (Signed)
  Echocardiogram 2D Echocardiogram has been performed.  Dennis Lewis 09/17/2018, 8:48 AM

## 2018-09-17 NOTE — H&P (Signed)
History and Physical    RIGGINS CISEK WUX:[id number] DOB: March 22, 1956 DOA: 09/16/2018  PCP: [name], DO  Patient coming from: Home  I have personally briefly reviewed patient's old medical records in [place]  Chief Complaint: SOB  HPI: Dennis Lewis is a 62 y.o. male with medical history significant of A.Fib on eliquis, systolic CHF with EF 25-36%, NICDM, LBBB.  Patient presents to the ED with c/o SOB and palpitations.  Was in usual state of health until this afternoon.  Noticed abd bloating and palpitations.  Then developed SOB.  Mild, dry, non-productive cough.  No CP.  Has been on 2 courses of steroids recently for gout, just finished with this 1 week ago.   ED Course: A.Fib RVR, mild pulm edema on CXR, BNP 1600, trop neg.   Review of Systems: As per HPI otherwise 10 point review of systems negative.   Past Medical History:  Diagnosis Date  . A-fib ([place])   . Anxiety   . Blood clot in vein 2017  . Cardiomyopathy due to hypertension, without heart failure ([place])   . Congestive dilated cardiomyopathy ([place]) 04/23/2016  . GERD (gastroesophageal reflux disease)   . H/O cardiovascular stress test 2010   [name]  . H/O echocardiogram 2010    mild LVH with some septal dyssynergy, EF 45%, impaired relaxation  . History of prostate cancer   . Hyperlipidemia   . Hypertension   . Left bundle branch block   . NSVT (nonsustained ventricular tachycardia) ([place])   . Palpitations   . Prostate cancer ([place]) 04/02/14   Gleason 7, volume 30 gm  . S/P radiation therapy 06/26/2014 through 08/22/2014                                                      Prostate 7800 cGy in 40 sessions                        . Torn tendon    right shoulder    Past Surgical History:  Procedure Laterality Date  . BACK SURGERY  1993  . CARDIAC CATHETERIZATION  1990   Normal cors  . CARDIAC CATHETERIZATION N/A 06/06/2016   Procedure: Left Heart Cath and Coronary Angiography;  Surgeon:  Jettie Booze, MD;  Location: [facility];  Service: Cardiovascular;  Laterality: N/A;  . PROSTATE BIOPSY  04/02/14   Gleason 7, vol 30 gm     reports that he quit smoking about 39 years ago. His smoking use included cigarettes. He has never used smokeless tobacco. He reports current alcohol use of about 28.0 standard drinks of alcohol per week. He reports that he does not use drugs.  Allergies  Allergen Reactions  . Ace Inhibitors Other (See Comments)    Cold sores   . Chocolate Other (See Comments)    Blisters   . Other Other (See Comments)    Seed on bread causes lip blisters Anesthesia also, but does nor remember which one  . Peanut-Containing Drug Products Other (See Comments)    Blisters  . Hydralazine Palpitations    Irregular heartbeat, fluttering    Family History  Problem Relation Age of Onset  . Throat cancer Father 71       smoker  . Esophageal cancer Father   .  Prostate cancer Brother        surgery  . Colon cancer Neg Hx   . Diabetes Neg Hx   . Colon polyps Neg Hx      Prior to Admission medications   Medication Sig Start Date End Date Taking? Authorizing Provider  ELIQUIS 5 MG TABS tablet TAKE 1 TABLET BY MOUTH TWICE DAILY Patient taking differently: Take 5 mg by mouth 2 (two) times daily.  05/02/18  Yes [name], [name], MD  furosemide (LASIX) 20 MG tablet Take 2 tablets (40 mg total) by mouth daily. KEEP OV. Patient taking differently: Take 20 mg by mouth daily.  06/12/18  Yes [name], [name], MD  isosorbide mononitrate (IMDUR) 30 MG 24 hr tablet Take 1 tablet (30 mg total) by mouth daily. ** DO NOT CRUSH ** 09/04/18  Yes [name], [name], MD  metoprolol succinate (TOPROL-XL) 100 MG 24 hr tablet Take 1 tablet (100 mg total) by mouth daily. 09/11/18  Yes [name], [name], MD  nitroGLYCERIN (NITROSTAT) 0.4 MG SL tablet Place 1 tablet under the tongue every 5 (five) minutes as needed for chest pain. X 3 doses 07/05/16  Yes [provider]    potassium chloride (K-DUR) 10 MEQ tablet Take 1 tablet (10 mEq total) by mouth daily. 08/01/18 10/30/18 Yes [name], [name], MD  sacubitril-valsartan (ENTRESTO) 97-103 MG Take 1 tablet by mouth 2 (two) times daily. 08/02/18  Yes [name], [name], MD  potassium chloride (K-DUR,KLOR-CON) 10 MEQ tablet TAKE 1 TABLET BY MOUTH TWICE DAILY Patient not taking: Reported on 09/17/2018 07/05/18   [name], [name], MD  rosuvastatin (CRESTOR) 10 MG tablet Take 1 tablet (10 mg total) by mouth daily. Patient not taking: Reported on 09/17/2018 07/27/18 10/25/18  [name], [name], MD  terbinafine (LAMISIL) 1 % cream Apply to affected area twice daily until rash gone, then apply 2 more days. (for tinea corporis) Patient not taking: Reported on 09/17/2018 07/19/18   [name], DO  valACYclovir (VALTREX) 1000 MG tablet At first onset symptoms take 1 tab p.o. daily x3 days Patient not taking: Reported on 09/17/2018 07/10/18   [name], DO    Physical Exam: Vitals:   09/17/18 0000 09/17/18 0015 09/17/18 0030 09/17/18 0045  BP: (!) 144/91 126/76 (!) 148/87 117/71  Pulse: 89 93 (!) 58 92  Resp: 19 15 (!) 23 14  Temp:      SpO2: 97% 97% 97% 97%  Weight:      Height:        Constitutional: NAD, calm, comfortable Eyes: PERRL, lids and conjunctivae normal ENMT: Mucous membranes are moist. Posterior pharynx clear of any exudate or lesions.Normal dentition.  Neck: normal, supple, no masses, no thyromegaly Respiratory: clear to auscultation bilaterally, no wheezing, no crackles. Normal respiratory effort. No accessory muscle use.  Cardiovascular: IRR, IRR Abdomen: no tenderness, no masses palpated. No hepatosplenomegaly. Bowel sounds positive.  Musculoskeletal: no clubbing / cyanosis. No joint deformity upper and lower extremities. Good ROM, no contractures. Normal muscle tone.  Skin: no rashes, lesions, ulcers. No induration Neurologic: CN 2-12 grossly intact. Sensation intact, DTR normal. Strength 5/5  in all 4.  Psychiatric: Normal judgment and insight. Alert and oriented x 3. Normal mood.    Labs on Admission: I have personally reviewed following labs and imaging studies  CBC: Recent Labs  Lab 09/16/18 2106  WBC 8.2  HGB 15.0  HCT 46.0  MCV 96.4  PLT 935   Basic Metabolic Panel: Recent Labs  Lab 09/16/18  2106  NA 140  K 4.4  CL 104  CO2 27  GLUCOSE 111*  BUN 14  CREATININE 1.01  CALCIUM 9.2   GFR: Estimated Creatinine Clearance: 74.9 mL/min (by C-G formula based on SCr of 1.01 mg/dL). Liver Function Tests: No results for input(s): AST, ALT, ALKPHOS, BILITOT, PROT, ALBUMIN in the last 168 hours. No results for input(s): LIPASE, AMYLASE in the last 168 hours. No results for input(s): AMMONIA in the last 168 hours. Coagulation Profile: No results for input(s): INR, PROTIME in the last 168 hours. Cardiac Enzymes: No results for input(s): CKTOTAL, CKMB, CKMBINDEX, TROPONINI in the last 168 hours. BNP (last 3 results) No results for input(s): PROBNP in the last 8760 hours. HbA1C: No results for input(s): HGBA1C in the last 72 hours. CBG: No results for input(s): GLUCAP in the last 168 hours. Lipid Profile: No results for input(s): CHOL, HDL, LDLCALC, TRIG, CHOLHDL, LDLDIRECT in the last 72 hours. Thyroid Function Tests: No results for input(s): TSH, T4TOTAL, FREET4, T3FREE, THYROIDAB in the last 72 hours. Anemia Panel: No results for input(s): VITAMINB12, FOLATE, FERRITIN, TIBC, IRON, RETICCTPCT in the last 72 hours. Urine analysis: No results found for: COLORURINE, APPEARANCEUR, [place], [place], [place], [place], [place], [place], [place], [place], NITRITE, LEUKOCYTESUR  Radiological Exams on Admission: Dg Chest 2 View  Result Date: 09/16/2018 CLINICAL DATA:  Shortness of breath starting last night EXAM: CHEST - 2 VIEW COMPARISON:  June 06, 2016 FINDINGS: The mediastinal contour is normal. Heart size is mildly enlarged. Increased pulmonary  interstitium is identified bilaterally. There is no focal pneumonia. Minimal left pleural effusion is noted. The visualized skeletal structures are stable. IMPRESSION: Mild cardiomegaly with mild interstitial edema. Electronically Signed   By: Abelardo Diesel M.D.   On: 09/16/2018 21:53    EKG: Independently reviewed.  Assessment/Plan Principal Problem:   Atrial fibrillation with RVR (HCC) Active Problems:   Left bundle branch block   Essential hypertension   Congestive dilated cardiomyopathy ([facility])   Alcohol consumption heavy- 4-6 beer/d many yrs   Acute on chronic systolic CHF (congestive heart failure) ([place])    1. A.fib RVR - 1. Cardizem gtt for rate control, currently controlled with Rate 90-100 in ED 2. Cont home metoprolol 3. Cont eliquis 2. Acute on chronic diastolic CHF - likely rate related due to A.fib RVR, some fluid retention due to 2x recent prednisone courses also likely. 1. CHF pathway 2. Got 40 lasix in ED, cont 40mg  IV daily for now 3. Update 2d echo, last done in Sept 4. BMP daily 5. Strict intake and output 3. HTN - 1. Cont home meds 4. H/o heavy EtOH consumption - CIWA  DVT prophylaxis: Eliquis Code Status: Full Family Communication: Family at bedside Disposition Plan: Home after admit Consults called: None Admission status: Place in [place], [place] Pager [phone] Only works nights!  If 7AM-7PM, please contact the primary day team physician taking care of patient  [email or web address] Password [email or web address]  09/17/2018, 1:05 AM

## 2018-09-17 NOTE — Discharge Summary (Signed)
Physician Discharge Summary  KEYVIN RISON [id number]:[id number] DOB: Feb 26, 1956 DOA: 09/16/2018  PCP: [name], DO  Admit date: 09/16/2018 Discharge date: 09/17/2018  Time spent: 45* minutes  Recommendations for Outpatient Follow-up:  1. Follow up cardiology on Monday 09/18/18, patient has an appointment   Discharge Diagnoses:  Principal Problem:   Atrial fibrillation with RVR ([place]) Active Problems:   Left bundle branch block   Essential hypertension   Congestive dilated cardiomyopathy ([place])   Alcohol consumption heavy- 4-6 beer/d many yrs   Acute on chronic systolic CHF (congestive heart failure) ([place])   Discharge Condition: Stable  Diet recommendation: heart healthy diet  Filed Weights   09/16/18 2058 09/17/18 0923  Weight: 84.8 kg 92 kg    History of present illness:  62 y.o. male with medical history significant of A.Fib on eliquis, systolic CHF with EF 78-24%, NICDM, LBBB.  Patient presents to the ED with c/o SOB and palpitations.  Was in usual state of health until this afternoon.  Noticed abd bloating and palpitations.  Then developed SOB.  Mild, dry, non-productive cough.  No CP.  Has been on 2 courses of steroids recently for gout, just finished with this 1 week ago.  Hospital Course:   Atrial fibrillation with RVR-heart rate is much better controlled after started on IV Cardizem which has been weaned off.  Patient restarted on metoprolol 100 mg daily.  Continue anticoagulation with Eliquis.  Acute on chronic diastolic CHF-patient says that recently his dose of Lasix was changed from 40 mg to 20 mg daily.  He also took prednisone for past 2 weeks for gout exacerbation.  Patient received IV Lasix in the hospital.  And is currently back to baseline.  He has an appointment to see cardiology in a.m.  We will continue with Lasix 20 mg p.o. daily.  Cardiology to adjust his dose. Patient also takes Entresto  Hypertension-blood pressures controlled, continue Entresto,  metoprolol.  Procedures:    Consultations:    Discharge Exam: Vitals:   09/17/18 1500 09/17/18 1530  BP:  129/79  Pulse: 63 (!) 34  Resp: 17 13  Temp:  97.9 F (36.6 C)  SpO2: 97% 98%    General: Appears in no acute distress Cardiovascular: S1-S2, irregular Respiratory: Clear to auscultation bilaterally.  Discharge Instructions   Discharge Instructions    Diet - low sodium heart healthy   Complete by:  As directed    Increase activity slowly   Complete by:  As directed      Allergies as of 09/17/2018      Reactions   Ace Inhibitors Other (See Comments)   Cold sores   Chocolate Other (See Comments)   Blisters   Other Other (See Comments)   Seed on bread causes lip blisters Anesthesia also, but does nor remember which one   Peanut-containing Drug Products Other (See Comments)   Blisters   Hydralazine Palpitations   Irregular heartbeat, fluttering      Medication List    STOP taking these medications   potassium chloride 10 MEQ tablet Commonly known as:  K-DUR,KLOR-CON     TAKE these medications   ELIQUIS 5 MG Tabs tablet Generic drug:  apixaban TAKE 1 TABLET BY MOUTH TWICE DAILY What changed:  how much to take   furosemide 20 MG tablet Commonly known as:  LASIX Take 1 tablet (20 mg total) by mouth daily. KEEP OV. What changed:  how much to take   isosorbide mononitrate 30 MG 24 hr tablet Commonly  known as:  IMDUR Take 1 tablet (30 mg total) by mouth daily. ** DO NOT CRUSH **   metoprolol succinate 100 MG 24 hr tablet Commonly known as:  TOPROL-XL Take 1 tablet (100 mg total) by mouth daily.   nitroGLYCERIN 0.4 MG SL tablet Commonly known as:  NITROSTAT Place 1 tablet under the tongue every 5 (five) minutes as needed for chest pain. X 3 doses   potassium chloride 10 MEQ tablet Commonly known as:  K-DUR Take 1 tablet (10 mEq total) by mouth daily.   rosuvastatin 10 MG tablet Commonly known as:  CRESTOR Take 1 tablet (10 mg total) by  mouth daily.   sacubitril-valsartan 97-103 MG Commonly known as:  ENTRESTO Take 1 tablet by mouth 2 (two) times daily.   terbinafine 1 % cream Commonly known as:  LAMISIL Apply to affected area twice daily until rash gone, then apply 2 more days. (for tinea corporis)   valACYclovir 1000 MG tablet Commonly known as:  VALTREX At first onset symptoms take 1 tab p.o. daily x3 days      Allergies  Allergen Reactions  . Ace Inhibitors Other (See Comments)    Cold sores   . Chocolate Other (See Comments)    Blisters   . Other Other (See Comments)    Seed on bread causes lip blisters Anesthesia also, but does nor remember which one  . Peanut-Containing Drug Products Other (See Comments)    Blisters  . Hydralazine Palpitations    Irregular heartbeat, fluttering      The results of significant diagnostics from this hospitalization (including imaging, microbiology, ancillary and laboratory) are listed below for reference.    Significant Diagnostic Studies: Dg Chest 2 View  Result Date: 09/16/2018 CLINICAL DATA:  Shortness of breath starting last night EXAM: CHEST - 2 VIEW COMPARISON:  June 06, 2016 FINDINGS: The mediastinal contour is normal. Heart size is mildly enlarged. Increased pulmonary interstitium is identified bilaterally. There is no focal pneumonia. Minimal left pleural effusion is noted. The visualized skeletal structures are stable. IMPRESSION: Mild cardiomegaly with mild interstitial edema. Electronically Signed   By: Abelardo Diesel M.D.   On: 09/16/2018 21:53    Microbiology: No results found for this or any previous visit (from the past 240 hour(s)).   Labs: Basic Metabolic Panel: Recent Labs  Lab 09/16/18 2106 09/17/18 1354  NA 140 141  K 4.4 3.9  CL 104 106  CO2 27 25  GLUCOSE 111* 110*  BUN 14 12  CREATININE 1.01 0.87  CALCIUM 9.2 9.3   Liver Function Tests: No results for input(s): AST, ALT, ALKPHOS, BILITOT, PROT, ALBUMIN in the last 168  hours. No results for input(s): LIPASE, AMYLASE in the last 168 hours. No results for input(s): AMMONIA in the last 168 hours. CBC: Recent Labs  Lab 09/16/18 2106  WBC 8.2  HGB 15.0  HCT 46.0  MCV 96.4  PLT 203   Cardiac Enzymes: No results for input(s): CKTOTAL, CKMB, CKMBINDEX, TROPONINI in the last 168 hours. BNP: BNP (last 3 results) Recent Labs    09/16/18 2107  BNP 1,651.2*       Signed:  Oswald Hillock MD.  [facility] 09/17/2018, 3:40 PM

## 2018-09-17 NOTE — Progress Notes (Signed)
Cardiology Office Note    Date:  09/18/2018   ID:  Dennis Lewis, DOB 1956-05-04, MRN [id number]  PCP:  [name], DO  Cardiologist:  Dr.  [name]    History of Present Illness:  Dennis Lewis is a 62 y.o. male seen for follow up CHF. He has a PMH of hypertension, GERD, hyperlipidemia, chronic LBBB, history of NSVT, prostate cancer s/p radiation therapy, persistent atrial flutter, and NICM with EF 35-40%. He has a history of Etoh abuse.  He was admitted for NSTEMI on 06/06/2016, he underwent emergent cardiac catheterization which only showed 30% proximal RCA disease, 25-35% ejection fraction, pattern of apical wall motion abnormality.  Surprisingly, his troponin peaked at 31.36 which is a lot higher than expected for stress-induced cardiomyopathy. Retrospectively, it is possible that he had an embolic MI.  Imdur and hydralazine were added. Initial echo obtained in September showed ejection fraction 25-30%. He was also noted to have new atrial fibrillation, due to elevated CHA2DS2-Vasc score, he was started on eliquis. Repeat echocardiogram 6 weeks later on 07/20/2016 showed ejection fraction has improved to 17-00%, grade 3 diastolic dysfunction, paradoxical ventricular septal motion consistent with LBBB.   He was seen in May 2019  for preoperative clearance prior to right elbow and upper arm procedure. Repeat Echo was ordered but rescheduled multiple times and finally performed in September. This demonstrated reduced LV function with EF 25-30% .  His persistent atrial fib/flutter  rate was controlled. He was started on Entresto and dose titrated by Pharm D. Recently admitted overnight with CHF exacerbation on 09/16/18. Lasix had been recently reduced due to gout. Afib rate was elevated. He notes he has a significant sodium load over the holidays.   He has not yet started  on Crestor for hypercholesterolemia. Thinks he can control this with diet.   Since hospital DC he has felt well with no  dyspnea or edema. No chest pain. Still has significant gouty pain in his right foot. Has not been able to exercise because of this. He remains abstinent from Etoh.    Past Medical History:  Diagnosis Date  . A-fib ([facility])   . Anxiety   . Blood clot in vein 2017  . Cardiomyopathy due to hypertension, without heart failure ([place])   . Congestive dilated cardiomyopathy ([place]) 04/23/2016  . GERD (gastroesophageal reflux disease)   . H/O cardiovascular stress test 2010   [name]  . H/O echocardiogram 2010    mild LVH with some septal dyssynergy, EF 45%, impaired relaxation  . History of prostate cancer   . Hyperlipidemia   . Hypertension   . Left bundle branch block   . NSVT (nonsustained ventricular tachycardia) ([place])   . Palpitations   . Prostate cancer ([place]) 04/02/14   Gleason 7, volume 30 gm  . S/P radiation therapy 06/26/2014 through 08/22/2014                                                      Prostate 7800 cGy in 40 sessions                        . Torn tendon    right shoulder    Past Surgical History:  Procedure Laterality Date  . BACK SURGERY  1993  . CARDIAC CATHETERIZATION  1990   Normal cors  . CARDIAC CATHETERIZATION N/A 06/06/2016   Procedure: Left Heart Cath and Coronary Angiography;  Surgeon: Jettie Booze, MD;  Location: [facility];  Service: Cardiovascular;  Laterality: N/A;  . PROSTATE BIOPSY  04/02/14   Gleason 7, vol 30 gm    Current Medications: Allergies as of 09/18/2018      Reactions   Ace Inhibitors Other (See Comments)   Cold sores   Chocolate Other (See Comments)   Blisters   Other Other (See Comments)   Seed on bread causes lip blisters Anesthesia also, but does nor remember which one   Peanut-containing Drug Products Other (See Comments)   Blisters   Hydralazine Palpitations   Irregular heartbeat, fluttering      Medication List       Accurate as of September 18, 2018  5:07 PM. Always use your most recent med list.          colchicine 0.6 MG tablet Take 0.6 mg twice a day for 1 week then take 0.6 mg daily   ELIQUIS 5 MG Tabs tablet Generic drug:  apixaban TAKE 1 TABLET BY MOUTH TWICE DAILY   furosemide 20 MG tablet Commonly known as:  LASIX Take 1 tablet (20 mg total) by mouth daily. KEEP OV.   isosorbide mononitrate 30 MG 24 hr tablet Commonly known as:  IMDUR Take 1 tablet (30 mg total) by mouth daily. ** DO NOT CRUSH **   metoprolol succinate 100 MG 24 hr tablet Commonly known as:  TOPROL-XL Take 100 mg every morning   metoprolol succinate 50 MG 24 hr tablet Commonly known as:  TOPROL-XL Take 50 mg every afternoon   nitroGLYCERIN 0.4 MG SL tablet Commonly known as:  NITROSTAT Place 1 tablet under the tongue every 5 (five) minutes as needed for chest pain. X 3 doses   potassium chloride 10 MEQ tablet Commonly known as:  K-DUR Take 1 tablet (10 mEq total) by mouth daily.   sacubitril-valsartan 97-103 MG Commonly known as:  ENTRESTO Take 1 tablet by mouth 2 (two) times daily.   spironolactone 25 MG tablet Commonly known as:  ALDACTONE Take 1/2 tablet ( 12.5 mg ) every morning   valACYclovir 1000 MG tablet Commonly known as:  VALTREX At first onset symptoms take 1 tab p.o. daily x3 days       Allergies:   Ace inhibitors; Chocolate; Other; Peanut-containing drug products; and Hydralazine   Social History   Socioeconomic History  . Marital status: Single    Spouse name: Not on file  . Number of children: 2  . Years of education: Not on file  . Highest education level: Not on file  Occupational History  . Occupation: Multimedia programmer  . Financial resource strain: Not on file  . Food insecurity:    Worry: Not on file    Inability: Not on file  . Transportation needs:    Medical: Not on file    Non-medical: Not on file  Tobacco Use  . Smoking status: Former Smoker    Types: Cigarettes    Last attempt to quit: 02/22/1979    Years since quitting: 39.5   . Smokeless tobacco: Never Used  Substance and Sexual Activity  . Alcohol use: Yes    Alcohol/week: 28.0 standard drinks    Types: 28 Cans of beer per week    Comment: beer daily - 4 daily  . Drug use: No  . Sexual activity: Not  Currently  Lifestyle  . Physical activity:    Days per week: Not on file    Minutes per session: Not on file  . Stress: Not on file  Relationships  . Social connections:    Talks on phone: Not on file    Gets together: Not on file    Attends religious service: Not on file    Active member of club or organization: Not on file    Attends meetings of clubs or organizations: Not on file    Relationship status: Not on file  Other Topics Concern  . Not on file  Social History Narrative  . Not on file     Family History:  The patient's family history includes Esophageal cancer in his father; Prostate cancer in his brother; Throat cancer (age of onset: 28) in his father.   ROS:   Please see the history of present illness.    ROSd All other systems reviewed and are negative.   PHYSICAL EXAM:   VS:  BP 138/78   Pulse 87   Ht 5\' 9"  (1.753 m)   Wt 191 lb 6.4 oz (86.8 kg)   BMI 28.26 kg/m   I get a pulse rate of 120 bpm.  GENERAL:  Well appearing WM in NAD HEENT:  PERRL, EOMI, sclera are clear. Oropharynx is clear. NECK:  No jugular venous distention, carotid upstroke brisk and symmetric, no bruits, no thyromegaly or adenopathy LUNGS:  Clear to auscultation bilaterally CHEST:  Unremarkable HEART:  IRRR,  PMI not displaced or sustained,S1 and S2 within normal limits, no S3, no S4: no clicks, no rubs, no murmurs ABD:  Soft, nontender. BS +, no masses or bruits. No hepatomegaly, no splenomegaly EXT:  2 + pulses throughout, no edema, no cyanosis no clubbing SKIN:  Warm and dry.  No rashes NEURO:  Alert and oriented x 3. Cranial nerves II through XII intact. PSYCH:  Cognitively intact    Wt Readings from Last 3 Encounters:  09/18/18 191 lb 6.4 oz (86.8  kg)  09/17/18 202 lb 13.2 oz (92 kg)  07/19/18 197 lb (89.4 kg)      Studies/Labs Reviewed:   EKG:  EKG is not ordered today.     Recent Labs: 07/19/2018: ALT 19; TSH 1.060 09/16/2018: B Natriuretic Peptide 1,651.2; Hemoglobin 15.0; Platelets 203 09/17/2018: BUN 12; Creatinine, Ser 0.87; Potassium 3.9; Sodium 141   Lipid Panel    Component Value Date/Time   CHOL 245 (H) 07/19/2018 0858   TRIG 198 (H) 07/19/2018 0858   HDL 50 07/19/2018 0858   CHOLHDL 4.9 07/19/2018 0858   LDLCALC 155 (H) 07/19/2018 0858    Additional studies/ records that were reviewed today include:  Labs dated 02/16/17: cholesterol 255, triglycerides 291, HDL 44, LDL 153. Chemistries, CBC, TSH normal.   Cath 06/06/2016 Conclusion     Prox RCA lesion, 30 %stenosed at a bend.  The left ventricular ejection fraction is 25-35% by visual estimate. The pattern of apical wall motion abnormality without severe CAD is suggestive of Takotsubo cardiomyopathy.  LV end diastolic pressure is moderately elevated.  There is no aortic valve stenosis.   Continue with aggressive medical therapy. He is allergic to ace inhibitors. Will add isosorbide to his hydralazine. Continue beta blocker. 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 He states that he was lying in bed for about 3 hours very worried about what he would do. He then started to have chest  discomfort. The history is also consistent with Takatsubo cardiomyopathy.  May need diuresis given moderately elevated LVEDP.      Echo 07/20/2016 LV EF: 40% -   45%  Study Conclusions  - Left ventricle: The cavity size was normal. Systolic function was   mildly to moderately reduced. The estimated ejection fraction was   in the range of 40% to 45%. Diffuse hypokinesis. There was a   reduced contribution of atrial contraction to ventricular   filling, due to increased ventricular diastolic pressure  or   atrial contractile dysfunction. Doppler parameters are consistent   with a reversible restrictive pattern, indicative of decreased   left ventricular diastolic compliance and/or increased left   atrial pressure (grade 3 diastolic dysfunction). - Ventricular septum: Septal motion showed moderate paradox. These   changes are consistent with intraventricular conduction delay. - Left atrium: The atrium was mildly dilated. - Pulmonary arteries: PA peak pressure: 40 mm Hg (S).  Impressions:  - The right ventricular systolic pressure was increased consistent   with mild pulmonary hypertension.  Echo 05/31/18: Study Conclusions  - Left ventricle: Systolic function was severely reduced. The   estimated ejection fraction was in the range of 25% to 30%. - Mitral valve: There was mild regurgitation.  ASSESSMENT:    1. Acute on chronic combined systolic and diastolic CHF (congestive heart failure) ([place])   2. Atrial fibrillation with RVR ([place])   3. Hyperlipidemia, unspecified hyperlipidemia type   4. Essential hypertension   5. LBBB (left bundle branch block)      PLAN:  In order of problems listed above:   1. Acute on Chronic systolic CHF. NICM:  Hospitalized yesterday overnight for same. Likely related to large sodium load. Now on maximal Entresto dose. Recommend increasing Toprol XL to 100 mg in the am and 50 mg in the pm for better rate control. Continue lasix 20 mg daily. Add aldactone 12.5 mg daily. Check BMET later this week. Unfortunately EF not improved on recent admit Echo but HR was still quite high. Will consider repeating Echo once HR and CHF meds ideal.  LV dysfunction is multifactorial related to prior Etoh abuse, dyssynergy with LBBB, prior embolic event, Afib with uncontrolled rate. Stressed importance of abstinence from [place] and sodium restriction.  Will follow up with Pharm D in 2-3 weeks for titration of meds. I will see in 2 months.  If EF remains low may be a  candidate for CRT/ICD.   2. Chronic atrial fib/flutter: on eliquis 5mg  BID. CHA2DS2-Vasc score 2 (HTN, HF).  Rate is poorly controlled. Increase Toprol XL to 100 mg in the am and 50 mg in the pm.  3. Hypertension:   4. Hyperlipidemia: Not on a statin. Thinks he can lower it with diet. Will repeat lipid panel in 2 months. If no better will add statin.   5.   Gout. Will give colchicine 0.6 mg bid for one week then daily.  Medication Adjustments/Labs and Tests Ordered: Current medicines are reviewed at length with the patient today.  Concerns regarding medicines are outlined above.  Medication changes, Labs and Tests ordered today are listed in the Patient Instructions below. Patient Instructions  Medication Instructions:   Start Colchicine 0.6 mg twice a day for 1 week then 0.6 mg daily for gout  Increase Toprol 100 mg in am and 50 mg in pm  Start Aldactone 12.5 mg daily  Lasix 20 mg daily  If you need a refill on your cardiac medications before your  next appointment, please call your pharmacy.   Lab work:  Bmet Thursday 09/21/18 If you have labs (blood work) drawn today and your tests are completely normal, you will receive your results only by: Marland Kitchen MyChart Message (if you have MyChart) OR . A paper copy in the mail If you have any lab test that is abnormal or we need to change your treatment, we will call you to review the results.  Testing/Procedures:  None ordered  Follow-Up: At [facility], you and your health needs are our priority.  As part of our continuing mission to provide you with exceptional heart care, we have created designated Provider Care Teams.  These Care Teams include your primary Cardiologist (physician) and Advanced Practice Providers (APPs -  Physician Assistants and Nurse Practitioners) who all work together to provide you with the care you need, when you need it.   Marland Kitchen Appointment scheduled with Pharmacist Thursday 10/05/18 at 11:00 am          Appointment scheduled with Dr.  Thursday 11/16/18 3:40 pm      Signed,  [name], MD  09/18/2018 5:06 PM    [facility] [address], [place], [place]  [address] Phone: [phone]; Fax: [phone]

## 2018-09-17 NOTE — ED Notes (Signed)
Attempted to call report to floor 

## 2018-09-18 ENCOUNTER — Ambulatory Visit (INDEPENDENT_AMBULATORY_CARE_PROVIDER_SITE_OTHER): Payer: BLUE CROSS/BLUE SHIELD | Admitting: Cardiology

## 2018-09-18 ENCOUNTER — Encounter: Payer: Self-pay | Admitting: Cardiology

## 2018-09-18 VITALS — BP 138/78 | HR 87 | Ht 69.0 in | Wt 191.4 lb

## 2018-09-18 DIAGNOSIS — I10 Essential (primary) hypertension: Secondary | ICD-10-CM

## 2018-09-18 DIAGNOSIS — I4891 Unspecified atrial fibrillation: Secondary | ICD-10-CM | POA: Diagnosis not present

## 2018-09-18 DIAGNOSIS — I447 Left bundle-branch block, unspecified: Secondary | ICD-10-CM

## 2018-09-18 DIAGNOSIS — E785 Hyperlipidemia, unspecified: Secondary | ICD-10-CM | POA: Diagnosis not present

## 2018-09-18 DIAGNOSIS — I5043 Acute on chronic combined systolic (congestive) and diastolic (congestive) heart failure: Secondary | ICD-10-CM | POA: Diagnosis not present

## 2018-09-18 DIAGNOSIS — M109 Gout, unspecified: Secondary | ICD-10-CM

## 2018-09-18 LAB — HIV ANTIBODY (ROUTINE TESTING W REFLEX): HIV Screen 4th Generation wRfx: NONREACTIVE

## 2018-09-18 MED ORDER — COLCHICINE 0.6 MG PO TABS: ORAL_TABLET | ORAL | 3 refills | Status: DC

## 2018-09-18 MED ORDER — METOPROLOL SUCCINATE ER 50 MG PO TB24: ORAL_TABLET | ORAL | 3 refills | Status: DC

## 2018-09-18 MED ORDER — SPIRONOLACTONE 25 MG PO TABS: ORAL_TABLET | ORAL | 3 refills | Status: DC

## 2018-09-18 NOTE — Patient Instructions (Addendum)
Medication Instructions:   Start Colchicine 0.6 mg twice a day for 1 week then 0.6 mg daily for gout  Increase Toprol 100 mg in am and 50 mg in pm  Start Aldactone 12.5 mg daily  Lasix 20 mg daily  If you need a refill on your cardiac medications before your next appointment, please call your pharmacy.   Lab work:  Bmet Thursday 09/21/18 If you have labs (blood work) drawn today and your tests are completely normal, you will receive your results only by: Marland Kitchen MyChart Message (if you have MyChart) OR . A paper copy in the mail If you have any lab test that is abnormal or we need to change your treatment, we will call you to review the results.  Testing/Procedures:  None ordered  Follow-Up: At [facility], you and your health needs are our priority.  As part of our continuing mission to provide you with exceptional heart care, we have created designated Provider Care Teams.  These Care Teams include your primary Cardiologist (physician) and Advanced Practice Providers (APPs -  Physician Assistants and Nurse Practitioners) who all work together to provide you with the care you need, when you need it.   Marland Kitchen Appointment scheduled with Pharmacist Thursday 10/05/18 at 11:00 am         Appointment scheduled with Dr.Jordan  Thursday 11/16/18 3:40 pm

## 2018-09-19 ENCOUNTER — Telehealth: Payer: Self-pay | Admitting: Cardiology

## 2018-09-19 ENCOUNTER — Ambulatory Visit: Payer: BLUE CROSS/BLUE SHIELD

## 2018-09-19 MED ORDER — COLCHICINE 0.6 MG PO TABS: ORAL_TABLET | ORAL | 3 refills | Status: DC

## 2018-09-19 NOTE — Telephone Encounter (Signed)
New Message    Patient states Dr. [name] wanted him to have an echo done but doesn't think the Dr. [place] Mink he had one at the hospital.  Patient wants to know is he should have another one done.

## 2018-09-19 NOTE — Telephone Encounter (Signed)
I don't want another Echo done now but maybe later once all meds are optimized. Will let him know.  Antoniette Peake [name] MD, [facility]

## 2018-09-19 NOTE — Telephone Encounter (Signed)
Last echo completed and resulted on 09/17/2018; routed to Dr. [name]

## 2018-09-21 ENCOUNTER — Other Ambulatory Visit: Payer: Self-pay

## 2018-09-21 MED ORDER — COLCHICINE 0.6 MG PO CAPS: ORAL_CAPSULE | ORAL | 3 refills | Status: DC

## 2018-09-21 NOTE — Telephone Encounter (Signed)
Spoke to patient 09/19/18 Dr.Jordan's recommendation given.

## 2018-09-22 DIAGNOSIS — I5043 Acute on chronic combined systolic (congestive) and diastolic (congestive) heart failure: Secondary | ICD-10-CM | POA: Diagnosis not present

## 2018-09-22 DIAGNOSIS — I10 Essential (primary) hypertension: Secondary | ICD-10-CM | POA: Diagnosis not present

## 2018-09-22 DIAGNOSIS — E785 Hyperlipidemia, unspecified: Secondary | ICD-10-CM | POA: Diagnosis not present

## 2018-09-22 DIAGNOSIS — I4891 Unspecified atrial fibrillation: Secondary | ICD-10-CM | POA: Diagnosis not present

## 2018-09-23 LAB — BASIC METABOLIC PANEL
BUN/Creatinine Ratio: 14 (ref 10–24)
BUN: 12 mg/dL (ref 8–27)
CO2: 22 mmol/L (ref 20–29)
Calcium: 9.6 mg/dL (ref 8.6–10.2)
Chloride: 103 mmol/L (ref 96–106)
Creatinine, Ser: 0.84 mg/dL (ref 0.76–1.27)
GFR calc Af Amer: 108 mL/min/{1.73_m2} (ref >59)
GFR calc non Af Amer: 93 mL/min/{1.73_m2} (ref >59)
Glucose: 84 mg/dL (ref 65–99)
Potassium: 5.1 mmol/L (ref 3.5–5.2)
Sodium: 143 mmol/L (ref 134–144)

## 2018-09-23 LAB — LIPID PANEL
Chol/HDL Ratio: 5.2 ratio — ABNORMAL HIGH (ref 0.0–5.0)
Cholesterol, Total: 217 mg/dL — ABNORMAL HIGH (ref 100–199)
HDL: 42 mg/dL (ref >39)
LDL CALC: 128 mg/dL — AB (ref 0–99)
Triglycerides: 236 mg/dL — ABNORMAL HIGH (ref 0–149)
VLDL Cholesterol Cal: 47 mg/dL — ABNORMAL HIGH (ref 5–40)

## 2018-09-25 ENCOUNTER — Other Ambulatory Visit: Payer: Self-pay

## 2018-09-25 DIAGNOSIS — I5023 Acute on chronic systolic (congestive) heart failure: Secondary | ICD-10-CM

## 2018-09-25 DIAGNOSIS — E785 Hyperlipidemia, unspecified: Secondary | ICD-10-CM

## 2018-10-05 ENCOUNTER — Ambulatory Visit: Payer: BLUE CROSS/BLUE SHIELD

## 2018-10-10 ENCOUNTER — Ambulatory Visit (INDEPENDENT_AMBULATORY_CARE_PROVIDER_SITE_OTHER): Payer: BLUE CROSS/BLUE SHIELD | Admitting: Pharmacist

## 2018-10-10 VITALS — BP 128/80 | HR 79 | Resp 14 | Ht 69.0 in | Wt 193.4 lb

## 2018-10-10 DIAGNOSIS — I10 Essential (primary) hypertension: Secondary | ICD-10-CM | POA: Diagnosis not present

## 2018-10-10 MED ORDER — SPIRONOLACTONE 25 MG PO TABS: ORAL_TABLET | ORAL | 1 refills | Status: DC

## 2018-10-10 NOTE — Patient Instructions (Addendum)
Return for a  follow up appointment in as needed  Go to the lab in 1 week  Check your blood pressure at home daily (if able) and keep record of the readings.  Take your BP meds as follows: *INCREASE spironolactone to 25mg  daily* *CONTINUE all other medication as prescribed*  Bring all of your meds, your BP cuff and your record of home blood pressures to your next appointment.  Exercise as you're able, try to walk approximately 30 minutes per day.  Keep salt intake to a minimum, especially watch canned and prepared boxed foods.  Eat more fresh fruits and vegetables and fewer canned items.  Avoid eating in fast food restaurants.    HOW TO TAKE YOUR BLOOD PRESSURE: . Rest 5 minutes before taking your blood pressure. .  Don't smoke or drink caffeinated beverages for at least 30 minutes before. . Take your blood pressure before (not after) you eat. . Sit comfortably with your back supported and both feet on the floor (don't cross your legs). . Elevate your arm to heart level on a table or a desk. . Use the proper sized cuff. It should fit smoothly and snugly around your bare upper arm. There should be enough room to slip a fingertip under the cuff. The bottom edge of the cuff should be 1 inch above the crease of the elbow. . Ideally, take 3 measurements at one sitting and record the average.

## 2018-10-10 NOTE — Progress Notes (Signed)
HPI:  Dennis Lewis is a 63 y.o. male patient of Dr [name], with a [place] below who presents today for heart failure medication titration.  Patient has HFrEF with most recent echo at 35-40%.  He also has a history of DVT, hypertension, hyperlipidemia, LBBB and s/p radiation therapy for prostate cancer.  An echocardiogram last month showed his EF was again low at 25-30%.  It had been that low back in 2017, but improved to 40-45% in Oct 2017.  Around that time he was found to be in atrial fibrillation and started on Eliquis.     Blood Pressure Goal:  130/80  Current Medications:  Entresto 97/103 mg twice daily  Metoprolol succ 100 mg in AM and 50 mg on PM  Furosemide 20 mg daily  Spironolactone 12.5mg  daily   Family Hx:  Father - cancer,   Mother - MVA  MGF - multiple MI's, other grandparents all died from old age  Social Hx:  Had stopped drinking, admits to previously drinking as much as a 12-pack of beer on weekend days              no tobacco since 1980;   Some decaf coffee, no caffeinated sodas  Diet:  Mostly home cooked meals; often banana and cheerios for dinner, sometimes salad (lettuce, carrots);   Exercise:  Walk 4 miles 4 days per week as weather allows (unable this week due to Achilles tendon problem)  Home BP readings:  Did not bring list of readings.  States he had been taking pictures of the results, but recently deleted them from his phone.  Recalls one reading as low as 98 systolic, but mostly 322-025 range.    Intolerances:   Peanuts, chocolate, wheat  - causes cold sores  Labs:  09/22/2018:  Na 143, K 5.1, Glu 84, BUN 12, Scr 0.84  06/29/2018: Na 143, K 4.5, Glu 84, BUN 11, SCr 0.92  Wt Readings from Last 3 Encounters:  10/10/18 193 lb 6.4 oz (87.7 kg)  09/18/18 191 lb 6.4 oz (86.8 kg)  09/17/18 202 lb 13.2 oz (92 kg)   BP Readings from Last 3 Encounters:  10/10/18 128/80  09/18/18 138/78  09/17/18 129/79   Pulse Readings from Last 3 Encounters:    10/10/18 79  09/18/18 87  09/17/18 (!) 34    Current Outpatient Medications  Medication Sig Dispense Refill  . Colchicine (MITIGARE) 0.6 MG CAPS Take 0.6 mg twice a day for 1 week then take 0.6 mg daily 97 capsule 3  . ELIQUIS 5 MG TABS tablet TAKE 1 TABLET BY MOUTH TWICE DAILY (Patient taking differently: Take 5 mg by mouth 2 (two) times daily. ) 120 tablet 2  . furosemide (LASIX) 20 MG tablet Take 1 tablet (20 mg total) by mouth daily. KEEP OV.    . isosorbide mononitrate (IMDUR) 30 MG 24 hr tablet Take 1 tablet (30 mg total) by mouth daily. ** DO NOT CRUSH ** 30 tablet 6  . metoprolol succinate (TOPROL-XL) 100 MG 24 hr tablet Take 100 mg every morning 90 tablet 3  . metoprolol succinate (TOPROL-XL) 50 MG 24 hr tablet Take 50 mg every afternoon 90 tablet 3  . nitroGLYCERIN (NITROSTAT) 0.4 MG SL tablet Place 1 tablet under the tongue every 5 (five) minutes as needed for chest pain. X 3 doses    . potassium chloride (K-DUR) 10 MEQ tablet Take 1 tablet (10 mEq total) by mouth daily. 90 tablet 3  . rosuvastatin (  CRESTOR) 10 MG tablet Take 1 tablet (10 mg total) by mouth daily. 90 tablet 3  . sacubitril-valsartan (ENTRESTO) 97-103 MG Take 1 tablet by mouth 2 (two) times daily. 60 tablet 5  . spironolactone (ALDACTONE) 25 MG tablet Take 1 tablet daily 90 tablet 1  . valACYclovir (VALTREX) 1000 MG tablet At first onset symptoms take 1 tab p.o. daily x3 days 9 tablet 1   No current facility-administered medications for this visit.     Allergies  Allergen Reactions  . Ace Inhibitors Other (See Comments)    Cold sores   . Chocolate Other (See Comments)    Blisters   . Other Other (See Comments)    Seed on bread causes lip blisters Anesthesia also, but does nor remember which one  . Peanut-Containing Drug Products Other (See Comments)    Blisters  . Hydralazine Palpitations    Irregular heartbeat, fluttering    Past Medical History:  Diagnosis Date  . A-fib ([place])   . Anxiety   .  Blood clot in vein 2017  . Cardiomyopathy due to hypertension, without heart failure ([place])   . Congestive dilated cardiomyopathy ([place]) 04/23/2016  . GERD (gastroesophageal reflux disease)   . H/O cardiovascular stress test 2010   [name]  . H/O echocardiogram 2010    mild LVH with some septal dyssynergy, EF 45%, impaired relaxation  . History of prostate cancer   . Hyperlipidemia   . Hypertension   . Left bundle branch block   . NSVT (nonsustained ventricular tachycardia) ([place])   . Palpitations   . Prostate cancer ([place]) 04/02/14   Gleason 7, volume 30 gm  . S/P radiation therapy 06/26/2014 through 08/22/2014                                                      Prostate 7800 cGy in 40 sessions                        . Torn tendon    right shoulder    Blood pressure 128/80, pulse 79, resp. rate 14, height 5\' 9"  (1.753 m), weight 193 lb 6.4 oz (87.7 kg).  Essential hypertension BP well controlled today and stable to further titratrate HF medication. Patient denies problems with current therapy and is already on max dose of Entresto. Will increase spironolactone to 25mg  daily, repeat BMET in 1 week, and follow up with Dr [name] in 4 weeks as previously scheduled.   Bhavya Grand Rodriguez-Guzman PharmD, BCPS, [place] [facility] [address] 10/18/2018 8:34 AM

## 2018-10-18 DIAGNOSIS — I10 Essential (primary) hypertension: Secondary | ICD-10-CM | POA: Diagnosis not present

## 2018-10-18 DIAGNOSIS — E785 Hyperlipidemia, unspecified: Secondary | ICD-10-CM | POA: Diagnosis not present

## 2018-10-18 DIAGNOSIS — I5023 Acute on chronic systolic (congestive) heart failure: Secondary | ICD-10-CM | POA: Diagnosis not present

## 2018-10-18 LAB — BASIC METABOLIC PANEL
BUN/Creatinine Ratio: 17 (ref 10–24)
BUN: 16 mg/dL (ref 8–27)
CO2: 21 mmol/L (ref 20–29)
Calcium: 9.4 mg/dL (ref 8.6–10.2)
Chloride: 103 mmol/L (ref 96–106)
Creatinine, Ser: 0.94 mg/dL (ref 0.76–1.27)
GFR calc Af Amer: 99 mL/min/{1.73_m2} (ref >59)
GFR calc non Af Amer: 86 mL/min/{1.73_m2} (ref >59)
Glucose: 95 mg/dL (ref 65–99)
Potassium: 5.3 mmol/L — ABNORMAL HIGH (ref 3.5–5.2)
Sodium: 141 mmol/L (ref 134–144)

## 2018-10-18 LAB — LIPID PANEL
Chol/HDL Ratio: 8.2 ratio — ABNORMAL HIGH (ref 0.0–5.0)
Cholesterol, Total: 302 mg/dL — ABNORMAL HIGH (ref 100–199)
HDL: 37 mg/dL — ABNORMAL LOW (ref >39)
LDL Calculated: 197 mg/dL — ABNORMAL HIGH (ref 0–99)
Triglycerides: 338 mg/dL — ABNORMAL HIGH (ref 0–149)
VLDL Cholesterol Cal: 68 mg/dL — ABNORMAL HIGH (ref 5–40)

## 2018-10-18 LAB — HEPATIC FUNCTION PANEL
ALT: 53 IU/L — ABNORMAL HIGH (ref 0–44)
AST: 28 IU/L (ref 0–40)
Albumin: 4.4 g/dL (ref 3.8–4.8)
Alkaline Phosphatase: 68 IU/L (ref 39–117)
BILIRUBIN, DIRECT: 0.09 mg/dL (ref 0.00–0.40)
Bilirubin Total: 0.3 mg/dL (ref 0.0–1.2)
Total Protein: 6.9 g/dL (ref 6.0–8.5)

## 2018-10-18 NOTE — Assessment & Plan Note (Signed)
BP well controlled today and stable to further titratrate HF medication. Patient denies problems with current therapy and is already on max dose of Entresto. Will increase spironolactone to 25mg  daily, repeat BMET in 1 week, and follow up with Dr [name] in 4 weeks as previously scheduled.

## 2018-10-19 ENCOUNTER — Other Ambulatory Visit: Payer: Self-pay

## 2018-10-19 DIAGNOSIS — E785 Hyperlipidemia, unspecified: Secondary | ICD-10-CM

## 2018-10-19 DIAGNOSIS — I10 Essential (primary) hypertension: Secondary | ICD-10-CM

## 2018-10-19 DIAGNOSIS — I4891 Unspecified atrial fibrillation: Secondary | ICD-10-CM

## 2018-10-19 MED ORDER — ROSUVASTATIN CALCIUM 40 MG PO TABS: 40.0000 mg | ORAL_TABLET | Freq: Every day | ORAL | 6 refills | Status: DC

## 2018-11-13 NOTE — Progress Notes (Deleted)
Cardiology Office Note    Date:  11/13/2018   ID:  Dennis Lewis, DOB Dec 30, 1955, MRN [id number]  PCP:  Dennis Dance, DO  Cardiologist:  [name]    History of Present Illness:  Dennis Lewis is a 63 y.o. male seen for follow up CHF. He has a PMH of hypertension, GERD, hyperlipidemia, chronic LBBB, history of NSVT, prostate cancer s/p radiation therapy, persistent atrial flutter, and NICM with EF 35-40%. He has a history of Etoh abuse.  He was admitted for NSTEMI on 06/06/2016, he underwent emergent cardiac catheterization which only showed 30% proximal RCA disease, 25-35% ejection fraction, pattern of apical wall motion abnormality.  Surprisingly, his troponin peaked at 31.36 which is a lot higher than expected for stress-induced cardiomyopathy. Retrospectively, it is possible that he had an embolic MI.  Imdur and hydralazine were added. Initial echo obtained in September showed ejection fraction 25-30%. He was also noted to have new atrial fibrillation, due to elevated CHA2DS2-Vasc score, he was started on eliquis. Repeat echocardiogram 6 weeks later on 07/20/2016 showed ejection fraction has improved to 37-85%, grade 3 diastolic dysfunction, paradoxical ventricular septal motion consistent with LBBB.   He was seen in May 2019  for preoperative clearance prior to right elbow and upper arm procedure. Repeat Echo was ordered but rescheduled multiple times and finally performed in September. This demonstrated reduced LV function with EF 25-30% .  His persistent atrial fib/flutter  rate was controlled. He was started on Entresto and CHF meds titrated by Pharm D. He was admitted overnight with CHF exacerbation on 09/16/18. Lasix had been recently reduced due to gout. Afib rate was elevated. He notes he has a significant sodium load over the holidays.   He has not yet started  on Crestor for hypercholesterolemia. Thinks he can control this with diet.   Since hospital DC he has felt well with no  dyspnea or edema. No chest pain. Still has significant gouty pain in his right foot. Has not been able to exercise because of this. He remains abstinent from Etoh.    Past Medical History:  Diagnosis Date  . A-fib ([place])   . Anxiety   . Blood clot in vein 2017  . Cardiomyopathy due to hypertension, without heart failure ([place])   . Congestive dilated cardiomyopathy ([place]) 04/23/2016  . GERD (gastroesophageal reflux disease)   . H/O cardiovascular stress test 2010   [name]  . H/O echocardiogram 2010    mild LVH with some septal dyssynergy, EF 45%, impaired relaxation  . History of prostate cancer   . Hyperlipidemia   . Hypertension   . Left bundle branch block   . NSVT (nonsustained ventricular tachycardia) ([place])   . Palpitations   . Prostate cancer ([place]) 04/02/14   Gleason 7, volume 30 gm  . S/P radiation therapy 06/26/2014 through 08/22/2014                                                      Prostate 7800 cGy in 40 sessions                        . Torn tendon    right shoulder    Past Surgical History:  Procedure Laterality Date  . BACK SURGERY  1993  . CARDIAC  CATHETERIZATION  1990   Normal cors  . CARDIAC CATHETERIZATION N/A 06/06/2016   Procedure: Left Heart Cath and Coronary Angiography;  Surgeon: Jettie Booze, MD;  Location: [facility];  Service: Cardiovascular;  Laterality: N/A;  . PROSTATE BIOPSY  04/02/14   Gleason 7, vol 30 gm    Current Medications: Allergies as of 11/16/2018      Reactions   Ace Inhibitors Other (See Comments)   Cold sores   Chocolate Other (See Comments)   Blisters   Other Other (See Comments)   Seed on bread causes lip blisters Anesthesia also, but does nor remember which one   Peanut-containing Drug Products Other (See Comments)   Blisters   Hydralazine Palpitations   Irregular heartbeat, fluttering      Medication List       Accurate as of November 13, 2018  7:49 AM. Always use your most recent med list.         Colchicine 0.6 MG Caps Commonly known as:  MITIGARE Take 0.6 mg twice a day for 1 week then take 0.6 mg daily   ELIQUIS 5 MG Tabs tablet Generic drug:  apixaban TAKE 1 TABLET BY MOUTH TWICE DAILY   furosemide 20 MG tablet Commonly known as:  LASIX Take 1 tablet (20 mg total) by mouth daily. KEEP OV.   isosorbide mononitrate 30 MG 24 hr tablet Commonly known as:  IMDUR Take 1 tablet (30 mg total) by mouth daily. ** DO NOT CRUSH **   metoprolol succinate 100 MG 24 hr tablet Commonly known as:  TOPROL-XL Take 100 mg every morning   metoprolol succinate 50 MG 24 hr tablet Commonly known as:  TOPROL-XL Take 50 mg every afternoon   nitroGLYCERIN 0.4 MG SL tablet Commonly known as:  NITROSTAT Place 1 tablet under the tongue every 5 (five) minutes as needed for chest pain. X 3 doses   potassium chloride 10 MEQ tablet Commonly known as:  K-DUR Take 1 tablet (10 mEq total) by mouth daily.   rosuvastatin 40 MG tablet Commonly known as:  CRESTOR Take 1 tablet (40 mg total) by mouth daily.   sacubitril-valsartan 97-103 MG Commonly known as:  ENTRESTO Take 1 tablet by mouth 2 (two) times daily.   spironolactone 25 MG tablet Commonly known as:  ALDACTONE Take 1/2 tablet ( 12.5 mg ) daily   valACYclovir 1000 MG tablet Commonly known as:  VALTREX At first onset symptoms take 1 tab p.o. daily x3 days       Allergies:   Ace inhibitors; Chocolate; Other; Peanut-containing drug products; and Hydralazine   Social History   Socioeconomic History  . Marital status: Single    Spouse name: Not on file  . Number of children: 2  . Years of education: Not on file  . Highest education level: Not on file  Occupational History  . Occupation: Multimedia programmer  . Financial resource strain: Not on file  . Food insecurity:    Worry: Not on file    Inability: Not on file  . Transportation needs:    Medical: Not on file    Non-medical: Not on file  Tobacco Use    . Smoking status: Former Smoker    Types: Cigarettes    Last attempt to quit: 02/22/1979    Years since quitting: 39.7  . Smokeless tobacco: Never Used  Substance and Sexual Activity  . Alcohol use: Yes    Alcohol/week: 28.0 standard drinks  Types: 28 Cans of beer per week    Comment: beer daily - 4 daily  . Drug use: No  . Sexual activity: Not Currently  Lifestyle  . Physical activity:    Days per week: Not on file    Minutes per session: Not on file  . Stress: Not on file  Relationships  . Social connections:    Talks on phone: Not on file    Gets together: Not on file    Attends religious service: Not on file    Active member of club or organization: Not on file    Attends meetings of clubs or organizations: Not on file    Relationship status: Not on file  Other Topics Concern  . Not on file  Social History Narrative  . Not on file     Family History:  The patient's family history includes Esophageal cancer in his father; Prostate cancer in his brother; Throat cancer (age of onset: 27) in his father.   ROS:   Please see the history of present illness.    ROSd All other systems reviewed and are negative.   PHYSICAL EXAM:   VS:  There were no vitals taken for this visit.  I get a pulse rate of 120 bpm.  GENERAL:  Well appearing WM in NAD HEENT:  PERRL, EOMI, sclera are clear. Oropharynx is clear. NECK:  No jugular venous distention, carotid upstroke brisk and symmetric, no bruits, no thyromegaly or adenopathy LUNGS:  Clear to auscultation bilaterally CHEST:  Unremarkable HEART:  IRRR,  PMI not displaced or sustained,S1 and S2 within normal limits, no S3, no S4: no clicks, no rubs, no murmurs ABD:  Soft, nontender. BS +, no masses or bruits. No hepatomegaly, no splenomegaly EXT:  2 + pulses throughout, no edema, no cyanosis no clubbing SKIN:  Warm and dry.  No rashes NEURO:  Alert and oriented x 3. Cranial nerves II through XII intact. PSYCH:  Cognitively  intact    Wt Readings from Last 3 Encounters:  10/10/18 193 lb 6.4 oz (87.7 kg)  09/18/18 191 lb 6.4 oz (86.8 kg)  09/17/18 202 lb 13.2 oz (92 kg)      Studies/Labs Reviewed:   EKG:  EKG is not ordered today.     Recent Labs: 07/19/2018: TSH 1.060 09/16/2018: B Natriuretic Peptide 1,651.2; Hemoglobin 15.0; Platelets 203 10/18/2018: ALT 53; BUN 16; Creatinine, Ser 0.94; Potassium 5.3; Sodium 141   Lipid Panel    Component Value Date/Time   CHOL 302 (H) 10/18/2018 0953   TRIG 338 (H) 10/18/2018 0953   HDL 37 (L) 10/18/2018 0953   CHOLHDL 8.2 (H) 10/18/2018 0953   LDLCALC 197 (H) 10/18/2018 1517    Additional studies/ records that were reviewed today include:  Labs dated 02/16/17: cholesterol 255, triglycerides 291, HDL 44, LDL 153. Chemistries, CBC, TSH normal.   Cath 06/06/2016 Conclusion     Prox RCA lesion, 30 %stenosed at a bend.  The left ventricular ejection fraction is 25-35% by visual estimate. The pattern of apical wall motion abnormality without severe CAD is suggestive of Takotsubo cardiomyopathy.  LV end diastolic pressure is moderately elevated.  There is no aortic valve stenosis.   Continue with aggressive medical therapy. He is allergic to ace inhibitors. Will add isosorbide to his hydralazine. Continue beta blocker. 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 He states that he was lying in bed for about 3 hours very worried about  what he would do. He then started to have chest discomfort. The history is also consistent with Takatsubo cardiomyopathy.  May need diuresis given moderately elevated LVEDP.      Echo 07/20/2016 LV EF: 40% -   45%  Study Conclusions  - Left ventricle: The cavity size was normal. Systolic function was   mildly to moderately reduced. The estimated ejection fraction was   in the range of 40% to 45%. Diffuse hypokinesis. There was a   reduced contribution  of atrial contraction to ventricular   filling, due to increased ventricular diastolic pressure or   atrial contractile dysfunction. Doppler parameters are consistent   with a reversible restrictive pattern, indicative of decreased   left ventricular diastolic compliance and/or increased left   atrial pressure (grade 3 diastolic dysfunction). - Ventricular septum: Septal motion showed moderate paradox. These   changes are consistent with intraventricular conduction delay. - Left atrium: The atrium was mildly dilated. - Pulmonary arteries: PA peak pressure: 40 mm Hg (S).  Impressions:  - The right ventricular systolic pressure was increased consistent   with mild pulmonary hypertension.  Echo 05/31/18: Study Conclusions  - Left ventricle: Systolic function was severely reduced. The   estimated ejection fraction was in the range of 25% to 30%. - Mitral valve: There was mild regurgitation.  Echo 09/17/18: Study Conclusions  - Left ventricle: The cavity size was mildly dilated. Systolic   function was severely reduced. The estimated ejection fraction   was in the range of 25% to 30%. There is severe hypokinesis of   the inferoseptal myocardium. There is severe hypokinesis of the   inferolateral and inferior myocardium. The study was not   technically sufficient to allow evaluation of LV diastolic   dysfunction due to atrial fibrillation. Doppler parameters are   consistent with elevated ventricular end-diastolic filling   pressure. - Aortic valve: Trileaflet; mildly thickened, mildly calcified   leaflets. There was trivial regurgitation. Valve area (VTI): 2.49   cm^2. Valve area (Vmax): 3.2 cm^2. Valve area (Vmean): 2.66 cm^2. - Mitral valve: Calcified annulus. There was mild regurgitation. - Left atrium: The atrium was moderately dilated. - Tricuspid valve: There was mild regurgitation. - Pulmonic valve: There was mild regurgitation. - Pulmonary arteries: PA peak pressure: 35  mm Hg (S). - Pericardium, extracardiac: A trivial, free-flowing pericardial   effusion was identified along the right ventricular free wall.   The fluid had no internal echoes.  ASSESSMENT:    No diagnosis found.   PLAN:  In order of problems listed above:   1. Acute on Chronic systolic CHF. NICM:  Hospitalized yesterday overnight for same. Likely related to large sodium load. Now on maximal Entresto dose. Recommend increasing Toprol XL to 100 mg in the am and 50 mg in the pm for better rate control. Continue lasix 20 mg daily. Add aldactone 12.5 mg daily. Check BMET later this week. Unfortunately EF not improved on recent admit Echo but HR was still quite high. Will consider repeating Echo once HR and CHF meds ideal.  LV dysfunction is multifactorial related to prior Etoh abuse, dyssynergy with LBBB, prior embolic event, Afib with uncontrolled rate. Stressed importance of abstinence from [place] and sodium restriction.  Will follow up with Pharm D in 2-3 weeks for titration of meds. I will see in 2 months.  If EF remains low may be a candidate for CRT/ICD.   2. Chronic atrial fib/flutter: on eliquis 5mg  BID. CHA2DS2-Vasc score 2 (HTN, HF).  Rate is  poorly controlled. Increase Toprol XL to 100 mg in the am and 50 mg in the pm.  3. Hypertension:   4. Hyperlipidemia: Not on a statin and LDL 196. Recommended he start Crestor 40 mg daily. .  5.   Gout. Will give colchicine 0.6 mg bid for one week then daily.  Medication Adjustments/Labs and Tests Ordered: Current medicines are reviewed at length with the patient today.  Concerns regarding medicines are outlined above.  Medication changes, Labs and Tests ordered today are listed in the Patient Instructions below. There are no Patient Instructions on file for this visit.   Signed, Dennis Scadden [name], MD  11/13/2018 7:49 AM    [facility] [address], [place], [place]  [address] Phone: [phone]; Fax: [phone]

## 2018-11-16 ENCOUNTER — Ambulatory Visit: Payer: BLUE CROSS/BLUE SHIELD | Admitting: Cardiology

## 2018-12-07 ENCOUNTER — Other Ambulatory Visit: Payer: Self-pay | Admitting: Cardiology

## 2018-12-07 ENCOUNTER — Encounter: Payer: Self-pay | Admitting: Family Medicine

## 2018-12-07 ENCOUNTER — Other Ambulatory Visit: Payer: Self-pay

## 2018-12-07 MED ORDER — VALACYCLOVIR HCL 1 G PO TABS: ORAL_TABLET | ORAL | 1 refills | Status: DC

## 2018-12-14 ENCOUNTER — Encounter: Payer: Self-pay | Admitting: Family Medicine

## 2018-12-14 ENCOUNTER — Other Ambulatory Visit: Payer: Self-pay

## 2018-12-14 ENCOUNTER — Telehealth: Payer: Self-pay

## 2018-12-14 MED ORDER — METOPROLOL SUCCINATE ER 100 MG PO TB24: ORAL_TABLET | ORAL | 3 refills | Status: DC

## 2018-12-14 MED ORDER — APIXABAN 5 MG PO TABS: 5.0000 mg | ORAL_TABLET | Freq: Two times a day (BID) | ORAL | 0 refills | Status: DC

## 2018-12-14 MED ORDER — COLCHICINE 0.6 MG PO CAPS: ORAL_CAPSULE | ORAL | 3 refills | Status: DC

## 2018-12-14 MED ORDER — METOPROLOL SUCCINATE ER 50 MG PO TB24: ORAL_TABLET | ORAL | 3 refills | Status: DC

## 2018-12-14 MED ORDER — VALACYCLOVIR HCL 500 MG PO TABS: ORAL_TABLET | ORAL | 0 refills | Status: DC

## 2018-12-14 NOTE — Telephone Encounter (Signed)
Spoke to patient about email sent today.Advised I sent refills for colchicine 0.6 mg capsules,metoprolol 50 mg tablet and 100 mg tablet to [facility] on [address].

## 2018-12-14 NOTE — Telephone Encounter (Signed)
This was just filled by Dr Morrison [facility] office, needs to be a three month fill, amount dispensed was changed and resent.

## 2018-12-15 ENCOUNTER — Ambulatory Visit: Payer: BLUE CROSS/BLUE SHIELD | Admitting: Physician Assistant

## 2018-12-15 MED ORDER — COLCHICINE 0.6 MG PO CAPS: ORAL_CAPSULE | ORAL | 3 refills | Status: DC

## 2018-12-15 MED ORDER — METOPROLOL SUCCINATE ER 100 MG PO TB24: ORAL_TABLET | ORAL | 3 refills | Status: DC

## 2018-12-18 ENCOUNTER — Telehealth: Payer: Self-pay | Admitting: Cardiology

## 2018-12-18 ENCOUNTER — Telehealth: Payer: BLUE CROSS/BLUE SHIELD | Admitting: Family Medicine

## 2018-12-18 MED ORDER — COLCHICINE 0.6 MG PO TABS: 0.6000 mg | ORAL_TABLET | Freq: Every day | ORAL | 3 refills | Status: DC

## 2018-12-18 NOTE — Telephone Encounter (Signed)
Insurance denied Colchicine capsules.I sent in Colchicine 0.6 mg tablets to pharmacy.

## 2018-12-18 NOTE — Telephone Encounter (Signed)
Fax received from [facility]/ CoverMyMeds staing Colchicine No flowsheet data found.  mg has been rejected.   [name] Patient Last Name: Dennis Lewis DOB: 01-Jan-1956  If clinically appropriate, change the prescription.  Routing to Dr. [name] as Juluis Rainier and Pierce, LPN.

## 2019-01-03 DIAGNOSIS — L570 Actinic keratosis: Secondary | ICD-10-CM | POA: Diagnosis not present

## 2019-01-03 DIAGNOSIS — X32XXXD Exposure to sunlight, subsequent encounter: Secondary | ICD-10-CM | POA: Diagnosis not present

## 2019-01-03 DIAGNOSIS — L7211 Pilar cyst: Secondary | ICD-10-CM | POA: Diagnosis not present

## 2019-01-03 DIAGNOSIS — C44319 Basal cell carcinoma of skin of other parts of face: Secondary | ICD-10-CM | POA: Diagnosis not present

## 2019-01-29 ENCOUNTER — Other Ambulatory Visit: Payer: Self-pay | Admitting: Cardiology

## 2019-01-29 ENCOUNTER — Ambulatory Visit: Payer: BLUE CROSS/BLUE SHIELD | Admitting: Cardiology

## 2019-02-05 ENCOUNTER — Other Ambulatory Visit: Payer: Self-pay | Admitting: Cardiology

## 2019-02-26 DIAGNOSIS — Z08 Encounter for follow-up examination after completed treatment for malignant neoplasm: Secondary | ICD-10-CM | POA: Diagnosis not present

## 2019-02-26 DIAGNOSIS — Z85828 Personal history of other malignant neoplasm of skin: Secondary | ICD-10-CM | POA: Diagnosis not present

## 2019-03-08 ENCOUNTER — Other Ambulatory Visit: Payer: Self-pay | Admitting: Cardiology

## 2019-03-09 ENCOUNTER — Other Ambulatory Visit: Payer: Self-pay | Admitting: Family Medicine

## 2019-03-12 ENCOUNTER — Other Ambulatory Visit: Payer: Self-pay

## 2019-03-12 MED ORDER — VALACYCLOVIR HCL 1 G PO TABS: ORAL_TABLET | ORAL | 0 refills | Status: DC

## 2019-03-27 ENCOUNTER — Other Ambulatory Visit: Payer: Self-pay | Admitting: Cardiology

## 2019-03-27 NOTE — Telephone Encounter (Signed)
Rx(s) sent to pharmacy electronically.  

## 2019-03-31 ENCOUNTER — Other Ambulatory Visit: Payer: Self-pay | Admitting: Family Medicine

## 2019-05-11 ENCOUNTER — Other Ambulatory Visit: Payer: Self-pay | Admitting: Family Medicine

## 2019-05-22 DIAGNOSIS — M545 Low back pain: Secondary | ICD-10-CM | POA: Diagnosis not present

## 2019-05-22 DIAGNOSIS — M5136 Other intervertebral disc degeneration, lumbar region: Secondary | ICD-10-CM | POA: Diagnosis not present

## 2019-05-24 NOTE — Progress Notes (Deleted)
Cardiology Office Note    Date:  05/24/2019   ID:  Dennis Lewis, DOB Feb 23, 1956, MRN [id number]  PCP:  [name], DO  Cardiologist:  Dr. Storey Stangeland [name]    History of Present Illness:  Dennis Lewis is a 63 y.o. male seen for follow up CHF. He has a PMH of hypertension, GERD, hyperlipidemia, chronic LBBB, history of NSVT, prostate cancer s/p radiation therapy, persistent atrial flutter, and NICM with EF 35-40%. He has a history of Etoh abuse.  He was admitted for NSTEMI on 06/06/2016, he underwent emergent cardiac catheterization which only showed 30% proximal RCA disease, 25-35% ejection fraction, pattern of apical wall motion abnormality.  Surprisingly, his troponin peaked at 31.36 which is a lot higher than expected for stress-induced cardiomyopathy. Retrospectively, it is possible that he had an embolic MI.  Imdur and hydralazine were added. Initial echo obtained in September showed ejection fraction 25-30%. He was also noted to have new atrial fibrillation, due to elevated CHA2DS2-Vasc score, he was started on eliquis. Repeat echocardiogram 6 weeks later on 07/20/2016 showed ejection fraction has improved to [id number], grade 3 diastolic dysfunction, paradoxical ventricular septal motion consistent with LBBB.   He was seen in May 2019  for preoperative clearance prior to right elbow and upper arm procedure. Repeat Echo was ordered but rescheduled multiple times and finally performed in September. This demonstrated reduced LV function with EF 25-30% .  His persistent atrial fib/flutter  rate was controlled. He was started on Entresto.  He was admitted overnight with CHF exacerbation on 09/16/18.   Lasix had been recently reduced due to gout. Afib rate was elevated. He notes he has a significant sodium load over the holidays.   He has not yet started  on Crestor for hypercholesterolemia. Thinks he can control this with diet.   Since hospital DC he has felt well with no dyspnea or edema. No chest  pain. Still has significant gouty pain in his right foot. Has not been able to exercise because of this. He remains abstinent from Etoh.    Past Medical History:  Diagnosis Date  . A-fib ([place])   . Anxiety   . Blood clot in vein 2017  . Cardiomyopathy due to hypertension, without heart failure ([place])   . Congestive dilated cardiomyopathy ([place]) 04/23/2016  . GERD (gastroesophageal reflux disease)   . H/O cardiovascular stress test 2010   [name]  . H/O echocardiogram 2010    mild LVH with some septal dyssynergy, EF 45%, impaired relaxation  . History of prostate cancer   . Hyperlipidemia   . Hypertension   . Left bundle branch block   . NSVT (nonsustained ventricular tachycardia) ([place])   . Palpitations   . Prostate cancer ([place]) 04/02/14   Gleason 7, volume 30 gm  . S/P radiation therapy 06/26/2014 through 08/22/2014                                                      Prostate 7800 cGy in 40 sessions                        . Torn tendon    right shoulder    Past Surgical History:  Procedure Laterality Date  . BACK SURGERY  1993  . [place]  Normal cors  . CARDIAC CATHETERIZATION N/A 06/06/2016   Procedure: Left Heart Cath and Coronary Angiography;  Surgeon: Jettie Booze, MD;  Location: [facility];  Service: Cardiovascular;  Laterality: N/A;  . PROSTATE BIOPSY  04/02/14   Gleason 7, vol 30 gm    Current Medications: Allergies as of 05/31/2019      Reactions   Ace Inhibitors Other (See Comments)   Cold sores   Chocolate Other (See Comments)   Blisters   Other Other (See Comments)   Seed on bread causes lip blisters Anesthesia also, but does nor remember which one   Peanut-containing Drug Products Other (See Comments)   Blisters   Hydralazine Palpitations   Irregular heartbeat, fluttering      Medication List       Accurate as of May 24, 2019  2:53 PM. If you have any questions, ask your nurse or doctor.         apixaban 5 MG Tabs tablet Commonly known as: Eliquis Take 1 tablet (5 mg total) by mouth 2 (two) times daily.   colchicine 0.6 MG tablet Take 1 tablet (0.6 mg total) by mouth daily.   Entresto 97-103 MG Generic drug: sacubitril-valsartan Take 1 tablet by mouth twice daily   furosemide 20 MG tablet Commonly known as: LASIX TAKE 2 TABLETS BY MOUTH DAILY.   isosorbide mononitrate 30 MG 24 hr tablet Commonly known as: IMDUR Take 1 tablet (30 mg total) by mouth daily. **DO NOT CRUSH**   metoprolol succinate 100 MG 24 hr tablet Commonly known as: TOPROL-XL Take 1 tablet (100 mg total) by mouth every morning AND 1 tablet (100 mg total) every evening. Take 100 mg every morning.   nitroGLYCERIN 0.4 MG SL tablet Commonly known as: NITROSTAT Place 1 tablet under the tongue every 5 (five) minutes as needed for chest pain. X 3 doses   potassium chloride 10 MEQ tablet Commonly known as: K-DUR Take 1 tablet (10 mEq total) by mouth daily.   potassium chloride 10 MEQ tablet Commonly known as: K-DUR Take 1 tablet by mouth twice daily   rosuvastatin 40 MG tablet Commonly known as: CRESTOR Take 1 tablet by mouth once daily   spironolactone 25 MG tablet Commonly known as: ALDACTONE Take 1/2 tablet ( 12.5 mg ) daily   valACYclovir 1000 MG tablet Commonly known as: VALTREX TAKE ONE TABLET BY MOUTH DAILY FOR 3 DAYS AT FIRST ONSET OF SYMPTOMS       Allergies:   Ace inhibitors, Chocolate, Other, Peanut-containing drug products, and Hydralazine   Social History   Socioeconomic History  . Marital status: Single    Spouse name: Not on file  . Number of children: 2  . Years of education: Not on file  . Highest education level: Not on file  Occupational History  . Occupation: Multimedia programmer  . Financial resource strain: Not on file  . Food insecurity    Worry: Not on file    Inability: Not on file  . Transportation needs    Medical: Not on file    Non-medical:  Not on file  Tobacco Use  . Smoking status: Former Smoker    Types: Cigarettes    Quit date: 02/22/1979    Years since quitting: 40.2  . Smokeless tobacco: Never Used  Substance and Sexual Activity  . Alcohol use: Yes    Alcohol/week: 28.0 standard drinks    Types: 28 Cans of beer per week    Comment:  beer daily - 4 daily  . Drug use: No  . Sexual activity: Not Currently  Lifestyle  . Physical activity    Days per week: Not on file    Minutes per session: Not on file  . Stress: Not on file  Relationships  . Social Herbalist on phone: Not on file    Gets together: Not on file    Attends religious service: Not on file    Active member of club or organization: Not on file    Attends meetings of clubs or organizations: Not on file    Relationship status: Not on file  Other Topics Concern  . Not on file  Social History Narrative  . Not on file     Family History:  The patient's family history includes Esophageal cancer in his father; Prostate cancer in his brother; Throat cancer (age of onset: 58) in his father.   ROS:   Please see the history of present illness.    ROSd All other systems reviewed and are negative.   PHYSICAL EXAM:   VS:  There were no vitals taken for this visit.  I get a pulse rate of 120 bpm.  GENERAL:  Well appearing WM in NAD HEENT:  PERRL, EOMI, sclera are clear. Oropharynx is clear. NECK:  No jugular venous distention, carotid upstroke brisk and symmetric, no bruits, no thyromegaly or adenopathy LUNGS:  Clear to auscultation bilaterally CHEST:  Unremarkable HEART:  IRRR,  PMI not displaced or sustained,S1 and S2 within normal limits, no S3, no S4: no clicks, no rubs, no murmurs ABD:  Soft, nontender. BS +, no masses or bruits. No hepatomegaly, no splenomegaly EXT:  2 + pulses throughout, no edema, no cyanosis no clubbing SKIN:  Warm and dry.  No rashes NEURO:  Alert and oriented x 3. Cranial nerves II through XII intact. PSYCH:   Cognitively intact    Wt Readings from Last 3 Encounters:  10/10/18 193 lb 6.4 oz (87.7 kg)  09/18/18 191 lb 6.4 oz (86.8 kg)  09/17/18 202 lb 13.2 oz (92 kg)      Studies/Labs Reviewed:   EKG:  EKG is not ordered today.     Recent Labs: 07/19/2018: TSH 1.060 09/16/2018: B Natriuretic Peptide 1,651.2; Hemoglobin 15.0; Platelets 203 10/18/2018: ALT 53; BUN 16; Creatinine, Ser 0.94; Potassium 5.3; Sodium 141   Lipid Panel    Component Value Date/Time   CHOL 302 (H) 10/18/2018 0953   TRIG 338 (H) 10/18/2018 0953   HDL 37 (L) 10/18/2018 0953   CHOLHDL 8.2 (H) 10/18/2018 0953   LDLCALC 197 (H) 10/18/2018 [id number]    Additional studies/ records that were reviewed today include:  Labs dated 02/16/17: cholesterol 255, triglycerides 291, HDL 44, LDL 153. Chemistries, CBC, TSH normal.   Cath 06/06/2016 Conclusion     Prox RCA lesion, 30 %stenosed at a bend.  The left ventricular ejection fraction is 25-35% by visual estimate. The pattern of apical wall motion abnormality without severe CAD is suggestive of Takotsubo cardiomyopathy.  LV end diastolic pressure is moderately elevated.  There is no aortic valve stenosis.   Continue with aggressive medical therapy. He is allergic to ace inhibitors. Will add isosorbide to his hydralazine. Continue beta blocker. 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 He states that he was lying in bed for about 3 hours very worried about what he would do. He then started to have chest discomfort.  The history is also consistent with Takatsubo cardiomyopathy.  May need diuresis given moderately elevated LVEDP.      Echo 07/20/2016 LV EF: 40% -   45%  Study Conclusions  - Left ventricle: The cavity size was normal. Systolic function was   mildly to moderately reduced. The estimated ejection fraction was   in the range of 40% to 45%. Diffuse hypokinesis. There was a   reduced  contribution of atrial contraction to ventricular   filling, due to increased ventricular diastolic pressure or   atrial contractile dysfunction. Doppler parameters are consistent   with a reversible restrictive pattern, indicative of decreased   left ventricular diastolic compliance and/or increased left   atrial pressure (grade 3 diastolic dysfunction). - Ventricular septum: Septal motion showed moderate paradox. These   changes are consistent with intraventricular conduction delay. - Left atrium: The atrium was mildly dilated. - Pulmonary arteries: PA peak pressure: 40 mm Hg (S).  Impressions:  - The right ventricular systolic pressure was increased consistent   with mild pulmonary hypertension.  Echo 05/31/18: Study Conclusions  - Left ventricle: Systolic function was severely reduced. The   estimated ejection fraction was in the range of 25% to 30%. - Mitral valve: There was mild regurgitation.  Echo 09/17/18: Study Conclusions  - Left ventricle: The cavity size was mildly dilated. Systolic   function was severely reduced. The estimated ejection fraction   was in the range of 25% to 30%. There is severe hypokinesis of   the inferoseptal myocardium. There is severe hypokinesis of the   inferolateral and inferior myocardium. The study was not   technically sufficient to allow evaluation of LV diastolic   dysfunction due to atrial fibrillation. Doppler parameters are   consistent with elevated ventricular end-diastolic filling   pressure. - Aortic valve: Trileaflet; mildly thickened, mildly calcified   leaflets. There was trivial regurgitation. Valve area (VTI): 2.49   cm^2. Valve area (Vmax): 3.2 cm^2. Valve area (Vmean): 2.66 cm^2. - Mitral valve: Calcified annulus. There was mild regurgitation. - Left atrium: The atrium was moderately dilated. - Tricuspid valve: There was mild regurgitation. - Pulmonic valve: There was mild regurgitation. - Pulmonary arteries: PA peak  pressure: 35 mm Hg (S). - Pericardium, extracardiac: A trivial, free-flowing pericardial   effusion was identified along the right ventricular free wall.   The fluid had no internal echoes.  ASSESSMENT:    No diagnosis found.   PLAN:  In order of problems listed above:   1. Acute on Chronic systolic CHF. NICM:   Now on maximal medical therapy with Entresto, Toprol XL and spironolactone.  By Echo in December 2019 EF persistently low at 25-30%. Since then medical therapy has been optimized.   LV dysfunction is multifactorial related to prior Etoh abuse, dyssynergy with LBBB, prior embolic event, Afib with uncontrolled rate. Stressed importance of abstinence from [place] and sodium restriction.   ? CRT/ICD.   2. Chronic atrial fib/flutter: on eliquis 5mg  BID. CHA2DS2-Vasc score 2 (HTN, HF).  Rate is poorly controlled. Increase Toprol XL to 100 mg in the am and 50 mg in the pm.  3. Hypertension:   4. Hyperlipidemia: Not on a statin. Thinks he can lower it with diet. Will repeat lipid panel in 2 months. If no better will add statin.   5.   Gout. Will give colchicine 0.6 mg bid for one week then daily.  Medication Adjustments/Labs and Tests Ordered: Current medicines are reviewed at length with the patient today.  Concerns regarding medicines are outlined above.  Medication changes, Labs and Tests ordered today are listed in the Patient Instructions below. There are no Patient Instructions on file for this visit.   Signed, Allea Kassner [name], MD  05/24/2019 2:53 PM    [facility] [place], [place], [place]  [address] Phone: [phone]; Fax: [phone]

## 2019-05-31 ENCOUNTER — Other Ambulatory Visit: Payer: Self-pay | Admitting: Cardiology

## 2019-05-31 ENCOUNTER — Ambulatory Visit: Payer: BLUE CROSS/BLUE SHIELD | Admitting: Cardiology

## 2019-06-06 ENCOUNTER — Other Ambulatory Visit: Payer: Self-pay | Admitting: Cardiology

## 2019-06-21 ENCOUNTER — Ambulatory Visit: Payer: BC Managed Care – PPO | Admitting: Cardiology

## 2019-07-11 ENCOUNTER — Other Ambulatory Visit: Payer: Self-pay | Admitting: Family Medicine

## 2019-07-19 ENCOUNTER — Encounter: Payer: BLUE CROSS/BLUE SHIELD | Admitting: Family Medicine

## 2019-07-21 ENCOUNTER — Other Ambulatory Visit: Payer: Self-pay | Admitting: Family Medicine

## 2019-07-24 ENCOUNTER — Other Ambulatory Visit: Payer: Self-pay | Admitting: Cardiology

## 2019-07-25 DIAGNOSIS — M25561 Pain in right knee: Secondary | ICD-10-CM | POA: Diagnosis not present

## 2019-07-25 DIAGNOSIS — S8001XA Contusion of right knee, initial encounter: Secondary | ICD-10-CM | POA: Diagnosis not present

## 2019-07-26 NOTE — Progress Notes (Deleted)
Cardiology Office Note    Date:  07/26/2019   ID:  Dennis Lewis, DOB [birth year], MRN [id number]  PCP:  [name], DO  Cardiologist:  Dr. Nasiah Lehenbauer [name]    History of Present Illness:  Dennis Lewis is a 63 y.o. male seen for follow up CHF. He has a PMH of hypertension, GERD, hyperlipidemia, chronic LBBB, history of NSVT, prostate cancer s/p radiation therapy, persistent atrial flutter, and NICM with EF 35-40%. He has a history of Etoh abuse.  He was admitted for NSTEMI on 06/06/2016, he underwent emergent cardiac catheterization which only showed 30% proximal RCA disease, 25-35% ejection fraction, pattern of apical wall motion abnormality.  Surprisingly, his troponin peaked at 31.36 which is a lot higher than expected for stress-induced cardiomyopathy. Retrospectively, it is possible that he had an embolic MI.  Imdur and hydralazine were added. Initial echo obtained in September showed ejection fraction 25-30%. He was also noted to have new atrial fibrillation, due to elevated CHA2DS2-Vasc score, he was started on eliquis. Repeat echocardiogram 6 weeks later on 07/20/2016 showed ejection fraction has improved to [id number], grade 3 diastolic dysfunction, paradoxical ventricular septal motion consistent with LBBB.   He was seen in May 2019  for preoperative clearance prior to right elbow and upper arm procedure. Repeat Echo was ordered but rescheduled multiple times and finally performed in September. This demonstrated reduced LV function with EF 25-30% .  His persistent atrial fib/flutter  rate was controlled. He was started on Entresto and dose titrated by Pharm D. He was admitted overnight with CHF exacerbation on 09/16/18. Lasix was reduced due to gout. Afib rate was elevated. When last seen by me in December rate was elevated and Toprol dose was increased. Followed by Dennis Lewis and Entresto and aldactone optimized. HR had been controlled. Last seen in January 2020.  Since hospital DC he has felt  well with no dyspnea or edema. No chest pain. Still has significant gouty pain in his right foot. Has not been able to exercise because of this. He remains abstinent from Etoh.    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   GERD (gastroesophageal reflux disease)    H/O cardiovascular stress test 2010   [name]   H/O echocardiogram 2010    mild LVH with some septal dyssynergy, EF 45%, impaired relaxation   History of prostate cancer    Hyperlipidemia    Hypertension    Left bundle branch block    NSVT (nonsustained ventricular tachycardia) (HCC)    Palpitations    Prostate cancer ([place]) 04/02/14   Gleason 7, volume 30 gm   S/P radiation therapy 06/26/2014 through 08/22/2014                                                      Prostate 7800 cGy in 40 sessions                         Torn tendon    right shoulder    Past Surgical History:  Procedure Laterality Date   [place]  1990   Normal cors   CARDIAC CATHETERIZATION N/A 06/06/2016   Procedure: Left Heart Cath and Coronary Angiography;  Surgeon: Jettie Booze, MD;  Location: [facility];  Service: Cardiovascular;  Laterality: N/A;   PROSTATE BIOPSY  04/02/14   Gleason 7, vol 30 gm    Current Medications: Allergies as of 08/01/2019      Reactions   Ace Inhibitors Other (See Comments)   Cold sores   Chocolate Other (See Comments)   Blisters   Other Other (See Comments)   Seed on bread causes lip blisters Anesthesia also, but does nor remember which one   Peanut-containing Drug Products Other (See Comments)   Blisters   Hydralazine Palpitations   Irregular heartbeat, fluttering      Medication List       Accurate as of July 26, 2019  3:21 PM. If you have any questions,  ask your nurse or doctor.        colchicine 0.6 MG tablet Take 1 tablet (0.6 mg total) by mouth daily.   Eliquis 5 MG Tabs tablet Generic drug: apixaban Take 1 tablet by mouth twice daily   Entresto 97-103 MG Generic drug: sacubitril-valsartan Take 1 tablet by mouth twice daily   furosemide 20 MG tablet Commonly known as: LASIX Take 2 tablets by mouth once daily   isosorbide mononitrate 30 MG 24 hr tablet Commonly known as: IMDUR Take 1 tablet (30 mg total) by mouth daily. **DO NOT CRUSH**   metoprolol succinate 100 MG 24 hr tablet Commonly known as: TOPROL-XL Take 1 tablet (100 mg total) by mouth every morning AND 1 tablet (100 mg total) every evening. Take 100 mg every morning.   nitroGLYCERIN 0.4 MG SL tablet Commonly known as: NITROSTAT Place 1 tablet under the tongue every 5 (five) minutes as needed for chest pain. X 3 doses   potassium chloride 10 MEQ tablet Commonly known as: KLOR-CON Take 1 tablet (10 mEq total) by mouth daily.   potassium chloride 10 MEQ tablet Commonly known as: KLOR-CON Take 1 tablet by mouth twice daily   rosuvastatin 40 MG tablet Commonly known as: CRESTOR Take 1 tablet by mouth once daily   spironolactone 25 MG tablet Commonly known as: ALDACTONE TAKE 1/2 (ONE-HALF) TABLET BY MOUTH ONCE DAILY IN THE MORNING   valACYclovir 1000 MG tablet Commonly known as: VALTREX TAKE ONE TABLET BY MOUTH DAILY FOR 3 DAYS AT FIRST ONSET OF SYMPTOMS       Allergies:   Ace inhibitors, Chocolate, Other, Peanut-containing drug products, and Hydralazine   Social History   Socioeconomic History   Marital status: Single    Spouse name: Not on file   Number of children: 2   Years of education: Not on file   Highest education level: Not on file  Occupational History   Occupation: Product/process development scientist strain: Not on file   Food insecurity    Worry: Not on file    Inability: Not on file   Transportation  needs    Medical: Not on file    Non-medical: Not on file  Tobacco Use   Smoking status: Former Smoker    Types: Cigarettes    Quit date: 02/22/1979    Years since quitting: 40.4   Smokeless tobacco: Never Used  Substance and Sexual Activity   Alcohol use: Yes    Alcohol/week: 28.0 standard drinks    Types: 28 Cans of beer per week  Comment: beer daily - 4 daily   Drug use: No   Sexual activity: Not Currently  Lifestyle   Physical activity    Days per week: Not on file    Minutes per session: Not on file   Stress: Not on file  Relationships   Social connections    Talks on phone: Not on file    Gets together: Not on file    Attends religious service: Not on file    Active member of club or organization: Not on file    Attends meetings of clubs or organizations: Not on file    Relationship status: Not on file  Other Topics Concern   Not on file  Social History Narrative   Not on file     Family History:  The patient's family history includes Esophageal cancer in his father; Prostate cancer in his brother; Throat cancer (age of onset: 44) in his father.   ROS:   Please see the history of present illness.    ROSd All other systems reviewed and are negative.   PHYSICAL EXAM:   VS:  There were no vitals taken for this visit.  I get a pulse rate of 120 bpm.  GENERAL:  Well appearing WM in NAD HEENT:  PERRL, EOMI, sclera are clear. Oropharynx is clear. NECK:  No jugular venous distention, carotid upstroke brisk and symmetric, no bruits, no thyromegaly or adenopathy LUNGS:  Clear to auscultation bilaterally CHEST:  Unremarkable HEART:  IRRR,  PMI not displaced or sustained,S1 and S2 within normal limits, no S3, no S4: no clicks, no rubs, no murmurs ABD:  Soft, nontender. BS +, no masses or bruits. No hepatomegaly, no splenomegaly EXT:  2 + pulses throughout, no edema, no cyanosis no clubbing SKIN:  Warm and dry.  No rashes NEURO:  Alert and oriented x 3. Cranial  nerves II through XII intact. PSYCH:  Cognitively intact    Wt Readings from Last 3 Encounters:  10/10/18 193 lb 6.4 oz (87.7 kg)  09/18/18 191 lb 6.4 oz (86.8 kg)  09/17/18 202 lb 13.2 oz (92 kg)      Studies/Labs Reviewed:   EKG:  EKG is not ordered today.     Recent Labs: 09/16/2018: B Natriuretic Peptide 1,651.2; Hemoglobin 15.0; Platelets 203 10/18/2018: ALT 53; BUN 16; Creatinine, Ser 0.94; Potassium 5.3; Sodium 141   Lipid Panel    Component Value Date/Time   CHOL 302 (H) 10/18/2018 0953   TRIG 338 (H) 10/18/2018 0953   HDL 37 (L) 10/18/2018 0953   CHOLHDL 8.2 (H) 10/18/2018 0953   LDLCALC 197 (H) 10/18/2018 [id number]    Additional studies/ records that were reviewed today include:  Labs dated 02/16/17: cholesterol 255, triglycerides 291, HDL 44, LDL 153. Chemistries, CBC, TSH normal.   Cath 06/06/2016 Conclusion     Prox RCA lesion, 30 %stenosed at a bend.  The left ventricular ejection fraction is 25-35% by visual estimate. The pattern of apical wall motion abnormality without severe CAD is suggestive of Takotsubo cardiomyopathy.  LV end diastolic pressure is moderately elevated.  There is no aortic valve stenosis.   Continue with aggressive medical therapy. He is allergic to ace inhibitors. Will add isosorbide to his hydralazine. Continue beta blocker. 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 He states that he was lying in bed for about 3 hours very worried about what he would do. He then started to have chest discomfort. The history  is also consistent with Takatsubo cardiomyopathy.  May need diuresis given moderately elevated LVEDP.      Echo 07/20/2016 LV EF: 40% -   45%  Study Conclusions  - Left ventricle: The cavity size was normal. Systolic function was   mildly to moderately reduced. The estimated ejection fraction was   in the range of 40% to 45%. Diffuse hypokinesis. There was  a   reduced contribution of atrial contraction to ventricular   filling, due to increased ventricular diastolic pressure or   atrial contractile dysfunction. Doppler parameters are consistent   with a reversible restrictive pattern, indicative of decreased   left ventricular diastolic compliance and/or increased left   atrial pressure (grade 3 diastolic dysfunction). - Ventricular septum: Septal motion showed moderate paradox. These   changes are consistent with intraventricular conduction delay. - Left atrium: The atrium was mildly dilated. - Pulmonary arteries: PA peak pressure: 40 mm Hg (S).  Impressions:  - The right ventricular systolic pressure was increased consistent   with mild pulmonary hypertension.  Echo 05/31/18: Study Conclusions  - Left ventricle: Systolic function was severely reduced. The   estimated ejection fraction was in the range of 25% to 30%. - Mitral valve: There was mild regurgitation.  Echo 09/17/18: Study Conclusions  - Left ventricle: The cavity size was mildly dilated. Systolic   function was severely reduced. The estimated ejection fraction   was in the range of 25% to 30%. There is severe hypokinesis of   the inferoseptal myocardium. There is severe hypokinesis of the   inferolateral and inferior myocardium. The study was not   technically sufficient to allow evaluation of LV diastolic   dysfunction due to atrial fibrillation. Doppler parameters are   consistent with elevated ventricular end-diastolic filling   pressure. - Aortic valve: Trileaflet; mildly thickened, mildly calcified   leaflets. There was trivial regurgitation. Valve area (VTI): 2.49   cm^2. Valve area (Vmax): 3.2 cm^2. Valve area (Vmean): 2.66 cm^2. - Mitral valve: Calcified annulus. There was mild regurgitation. - Left atrium: The atrium was moderately dilated. - Tricuspid valve: There was mild regurgitation. - Pulmonic valve: There was mild regurgitation. - Pulmonary  arteries: PA peak pressure: 35 mm Hg (S). - Pericardium, extracardiac: A trivial, free-flowing pericardial   effusion was identified along the right ventricular free wall.   The fluid had no internal echoes.  ASSESSMENT:    No diagnosis found.   PLAN:  In order of problems listed above:   1. Acute on Chronic systolic CHF. NICM:   Now on maximal Entresto dose, aldactone, and Toprol XL. Recommend increasing Toprol XL to 100 mg in the am and 50 mg in the pm for better rate control.  Unfortunately EF not improved on recent admit Echo but HR was still quite high. Will consider repeating Echo once HR and CHF meds ideal.  LV dysfunction is multifactorial related to prior Etoh abuse, dyssynergy with LBBB, prior embolic event, Afib with uncontrolled rate. Stressed importance of abstinence from [place] and sodium restriction.   If EF remains low may be a candidate for CRT/ICD.   2. Chronic atrial fib/flutter: on eliquis 5mg  BID. CHA2DS2-Vasc score 2 (HTN, HF).  Rate is poorly controlled. Increase Toprol XL to 100 mg in the am and 50 mg in the pm.  3. Hypertension:   4. Hyperlipidemia: Not on a statin. Thinks he can lower it with diet. Will repeat lipid panel in 2 months. If no better will add statin.   5.  Gout. Will give colchicine 0.6 mg bid for one week then daily.  Medication Adjustments/Labs and Tests Ordered: Current medicines are reviewed at length with the patient today.  Concerns regarding medicines are outlined above.  Medication changes, Labs and Tests ordered today are listed in the Patient Instructions below. There are no Patient Instructions on file for this visit.   Signed, Cliford Sequeira [name], MD  07/26/2019 3:21 PM    [address], [place], [place]  [address] Phone: [phone]; Fax: [phone]

## 2019-07-31 ENCOUNTER — Other Ambulatory Visit: Payer: Self-pay | Admitting: Cardiology

## 2019-08-01 ENCOUNTER — Ambulatory Visit: Payer: BC Managed Care – PPO | Admitting: Cardiology

## 2019-08-10 ENCOUNTER — Other Ambulatory Visit: Payer: Self-pay | Admitting: Cardiology

## 2019-08-10 ENCOUNTER — Other Ambulatory Visit: Payer: Self-pay | Admitting: Family Medicine

## 2019-08-30 ENCOUNTER — Other Ambulatory Visit: Payer: Self-pay | Admitting: Cardiology

## 2019-08-31 ENCOUNTER — Other Ambulatory Visit: Payer: Self-pay | Admitting: Cardiology

## 2019-08-31 NOTE — Telephone Encounter (Signed)
70 M, 87.7 kg, SCr 0.94 (09/2018), next OV scheduled 10/22/19 [name]

## 2019-09-17 DIAGNOSIS — L905 Scar conditions and fibrosis of skin: Secondary | ICD-10-CM | POA: Diagnosis not present

## 2019-09-19 ENCOUNTER — Encounter: Payer: Self-pay | Admitting: Family Medicine

## 2019-09-19 ENCOUNTER — Ambulatory Visit (INDEPENDENT_AMBULATORY_CARE_PROVIDER_SITE_OTHER): Payer: BC Managed Care – PPO | Admitting: Family Medicine

## 2019-09-19 ENCOUNTER — Other Ambulatory Visit: Payer: Self-pay

## 2019-09-19 VITALS — BP 145/85 | HR 76 | Temp 97.8°F | Resp 10 | Ht 71.0 in | Wt 195.4 lb

## 2019-09-19 DIAGNOSIS — I119 Hypertensive heart disease without heart failure: Secondary | ICD-10-CM

## 2019-09-19 DIAGNOSIS — I10 Essential (primary) hypertension: Secondary | ICD-10-CM

## 2019-09-19 DIAGNOSIS — Z719 Counseling, unspecified: Secondary | ICD-10-CM

## 2019-09-19 DIAGNOSIS — Z0000 Encounter for general adult medical examination without abnormal findings: Secondary | ICD-10-CM

## 2019-09-19 DIAGNOSIS — E785 Hyperlipidemia, unspecified: Secondary | ICD-10-CM

## 2019-09-19 DIAGNOSIS — I5181 Takotsubo syndrome: Secondary | ICD-10-CM

## 2019-09-19 DIAGNOSIS — Z23 Encounter for immunization: Secondary | ICD-10-CM | POA: Diagnosis not present

## 2019-09-19 DIAGNOSIS — C61 Malignant neoplasm of prostate: Secondary | ICD-10-CM

## 2019-09-19 MED ORDER — VALACYCLOVIR HCL 1 G PO TABS: ORAL_TABLET | ORAL | 3 refills | Status: DC

## 2019-09-19 NOTE — Patient Instructions (Addendum)
Preventive Care 40 Years and Older, Male Preventive care refers to lifestyle choices and visits with your health care provider that can promote health and wellness. What does preventive care include?   A yearly physical exam. This is also called an annual well check.  Dental exams once or twice a year.  Routine eye exams. Ask your health care provider how often you should have your eyes checked.  Personal lifestyle choices, including: ? Daily care of your teeth and gums. ? Regular physical activity. ? Eating a healthy diet. ? Avoiding tobacco and drug use. ? Limiting alcohol use. ? Practicing safe sex. ? Taking low doses of aspirin every day. ? Taking vitamin and mineral supplements as recommended by your health care provider. What happens during an annual well check? The services and screenings done by your health care provider during your annual well check will depend on your age, overall health, lifestyle risk factors, and family history of disease. Counseling Your health care provider may ask you questions about your:  Alcohol use.  Tobacco use.  Drug use.  Emotional well-being.  Home and relationship well-being.  Sexual activity.  Eating habits.  History of falls.  Memory and ability to understand (cognition).  Work and work Statistician. Screening You may have the following tests or measurements:  Height, weight, and BMI.  Blood pressure.  Lipid and cholesterol levels. These may be checked every 5 years, or more frequently if you are over 64 years old.  Skin check.  Lung cancer screening. You may have this screening every year starting at age 64 if you have a 30-pack-year history of smoking and currently smoke or have quit within the past 15 years.  Colorectal cancer screening. All adults should have this screening starting at age 64 and continuing until age 64. You will have tests every 1-10 years, depending on your results and the type of screening  test. People at increased risk should start screening at an earlier age. Screening tests may include: ? Guaiac-based fecal occult blood testing. ? Fecal immunochemical test (FIT). ? Stool DNA test. ? Virtual colonoscopy. ? Sigmoidoscopy. During this test, a flexible tube with a tiny camera (sigmoidoscope) is used to examine your rectum and lower colon. The sigmoidoscope is inserted through your anus into your rectum and lower colon. ? Colonoscopy. During this test, a long, thin, flexible tube with a tiny camera (colonoscope) is used to examine your entire colon and rectum.  Prostate cancer screening. Recommendations will vary depending on your family history and other risks.  Hepatitis C blood test.  Hepatitis B blood test.  Sexually transmitted disease (STD) testing.  Diabetes screening. This is done by checking your blood sugar (glucose) after you have not eaten for a while (fasting). You may have this done every 1-3 years.  Abdominal aortic aneurysm (AAA) screening. You may need this if you are a current or former smoker.  Osteoporosis. You may be screened starting at age 64 if you are at high risk. Talk with your health care provider about your test results, treatment options, and if necessary, the need for more tests. Vaccines Your health care provider may recommend certain vaccines, such as:  Influenza vaccine. This is recommended every year.  Tetanus, diphtheria, and acellular pertussis (Tdap, Td) vaccine. You may need a Td booster every 10 years.  Varicella vaccine. You may need this if you have not been vaccinated.  Zoster vaccine. You may need this after age 64.  Measles, mumps, and rubella (MMR)  vaccine. You may need at least one dose of MMR if you were born in 1957 or later. You may also need a second dose.  Pneumococcal 13-valent conjugate (PCV13) vaccine. One dose is recommended after age 64.  Pneumococcal polysaccharide (PPSV23) vaccine. One dose is recommended  after age 64.  Meningococcal vaccine. You may need this if you have certain conditions.  Hepatitis A vaccine. You may need this if you have certain conditions or if you travel or work in places where you may be exposed to hepatitis A.  Hepatitis B vaccine. You may need this if you have certain conditions or if you travel or work in places where you may be exposed to hepatitis B.  Haemophilus influenzae type b (Hib) vaccine. You may need this if you have certain risk factors. Talk to your health care provider about which screenings and vaccines you need and how often you need them. This information is not intended to replace advice given to you by your health care provider. Make sure you discuss any questions you have with your health care provider. Document Released: 10/03/2015 Document Revised: 10/27/2017 Document Reviewed: 07/08/2015 Elsevier Interactive Patient Education  2019 [place] for Adults, Male A healthy lifestyle and preventive care can promote health and wellness. Preventive health guidelines for men include the following key practices:  A routine yearly physical is a good way to check with your health care provider about your health and preventative screening. It is a chance to share any concerns and updates on your health and to receive a thorough exam.  Visit your dentist for a routine exam and preventative care every 6 months. Brush your teeth twice a day and floss once a day. Good oral hygiene prevents tooth decay and gum disease.  The frequency of eye exams is based on your age, health, family medical history, use of contact lenses, and other factors. Follow your health care provider's recommendations for frequency of eye exams.  Eat a healthy diet. Foods such as vegetables, fruits, whole grains, low-fat dairy products, and lean protein foods contain the nutrients you need without too many calories. Decrease your intake of foods high in  solid fats, added sugars, and salt. Eat the right amount of calories for you. Get information about a proper diet from your health care provider, if necessary.  Regular physical exercise is one of the most important things you can do for your health. Most adults should get at least 150 minutes of moderate-intensity exercise (any activity that increases your heart rate and causes you to sweat) each week. In addition, most adults need muscle-strengthening exercises on 2 or more days a week.  Maintain a healthy weight. The body mass index (BMI) is a screening tool to identify possible weight problems. It provides an estimate of body fat based on height and weight. Your health care provider can find your BMI and can help you achieve or maintain a healthy weight. For adults 20 years and older:  A BMI below 18.5 is considered underweight.  A BMI of 18.5 to 24.9 is normal.  A BMI of 25 to 29.9 is considered overweight.  A BMI of 30 and above is considered obese.  Maintain normal blood lipids and cholesterol levels by exercising and minimizing your intake of saturated fat. Eat a balanced diet with plenty of fruit and vegetables. Blood tests for lipids and cholesterol should begin at age 19 and be repeated every 5 years. If  your lipid or cholesterol levels are high, you are over 50, or you are at high risk for heart disease, you may need your cholesterol levels checked more frequently. Ongoing high lipid and cholesterol levels should be treated with medicines if diet and exercise are not working.  If you smoke, find out from your health care provider how to quit. If you do not use tobacco, do not start.  Lung cancer screening is recommended for adults aged 38-80 years who are at high risk for developing lung cancer because of a history of smoking. A yearly low-dose CT scan of the lungs is recommended for people who have at least a 30-pack-year history of smoking and are a current smoker or have quit within  the past 15 years. A pack year of smoking is smoking an average of 1 pack of cigarettes a day for 1 year (for example: 1 pack a day for 30 years or 2 packs a day for 15 years). Yearly screening should continue until the smoker has stopped smoking for at least 15 years. Yearly screening should be stopped for people who develop a health problem that would prevent them from having lung cancer treatment.  If you choose to drink alcohol, do not have more than 2 drinks per day. One drink is considered to be 12 ounces (355 mL) of beer, 5 ounces (148 mL) of wine, or 1.5 ounces (44 mL) of liquor.  Avoid use of street drugs. Do not share needles with anyone. Ask for help if you need support or instructions about stopping the use of drugs.  High blood pressure causes heart disease and increases the risk of stroke. Your blood pressure should be checked at least every 1-2 years. Ongoing high blood pressure should be treated with medicines, if weight loss and exercise are not effective.  If you are 73-8 years old, ask your health care provider if you should take aspirin to prevent heart disease.  Diabetes screening is done by taking a blood sample to check your blood glucose level after you have not eaten for a certain period of time (fasting). If you are not overweight and you do not have risk factors for diabetes, you should be screened once every 3 years starting at age 22. If you are overweight or obese and you are 42-10 years of age, you should be screened for diabetes every year as part of your cardiovascular risk assessment.  Colorectal cancer can be detected and often prevented. Most routine colorectal cancer screening begins at the age of 63 and continues through age 55. However, your health care provider may recommend screening at an earlier age if you have risk factors for colon cancer. On a yearly basis, your health care provider may provide home test kits to check for hidden blood in the stool. Use of a  small camera at the end of a tube to directly examine the colon (sigmoidoscopy or colonoscopy) can detect the earliest forms of colorectal cancer. Talk to your health care provider about this at age 57, when routine screening begins. Direct exam of the colon should be repeated every 5-10 years through age 64, unless early forms of precancerous polyps or small growths are found.  People who are at an increased risk for hepatitis B should be screened for this virus. You are considered at high risk for hepatitis B if:  You were born in a country where hepatitis B occurs often. Talk with your health care provider about which countries are considered high  risk.  Your parents were born in a high-risk country and you have not received a shot to protect against hepatitis B (hepatitis B vaccine).  You have HIV or AIDS.  You use needles to inject street drugs.  You live with, or have sex with, someone who has hepatitis B.  You are a man who has sex with other men (MSM).  You get hemodialysis treatment.  You take certain medicines for conditions such as cancer, organ transplantation, and autoimmune conditions.  Hepatitis C blood testing is recommended for all people born from [birth year] through 1965 and any individual with known risks for hepatitis C.  Practice safe sex. Use condoms and avoid high-risk sexual practices to reduce the spread of sexually transmitted infections (STIs). STIs include gonorrhea, chlamydia, syphilis, trichomonas, herpes, HPV, and human immunodeficiency virus (HIV). Herpes, HIV, and HPV are viral illnesses that have no cure. They can result in disability, cancer, and death.  If you are a man who has sex with other men, you should be screened at least once per year for:  HIV.  Urethral, rectal, and pharyngeal infection of gonorrhea, chlamydia, or both.  If you are at risk of being infected with HIV, it is recommended that you take a prescription medicine daily to prevent HIV  infection. This is called preexposure prophylaxis (PrEP). You are considered at risk if:  You are a man who has sex with other men (MSM) and have other risk factors.  You are a heterosexual man, are sexually active, and are at increased risk for HIV infection.  You take drugs by injection.  You are sexually active with a partner who has HIV.  Talk with your health care provider about whether you are at high risk of being infected with HIV. If you choose to begin PrEP, you should first be tested for HIV. You should then be tested every 3 months for as long as you are taking PrEP.  A one-time screening for abdominal aortic aneurysm (AAA) and surgical repair of large AAAs by ultrasound are recommended for men ages 47 to 59 years who are current or former smokers.  Healthy men should no longer receive prostate-specific antigen (PSA) blood tests as part of routine cancer screening. Talk with your health care provider about prostate cancer screening.  Testicular cancer screening is not recommended for adult males who have no symptoms. Screening includes self-exam, a health care provider exam, and other screening tests. Consult with your health care provider about any symptoms you have or any concerns you have about testicular cancer.  Use sunscreen. Apply sunscreen liberally and repeatedly throughout the day. You should seek shade when your shadow is shorter than you. Protect yourself by wearing long sleeves, pants, a wide-brimmed hat, and sunglasses year round, whenever you are outdoors.  Once a month, do a whole-body skin exam, using a mirror to look at the skin on your back. Tell your health care provider about new moles, moles that have irregular borders, moles that are larger than a pencil eraser, or moles that have changed in shape or color.  Stay current with required vaccines (immunizations).  Influenza vaccine. All adults should be immunized every year.  Tetanus, diphtheria, and  acellular pertussis (Td, Tdap) vaccine. An adult who has not previously received Tdap or who does not know his vaccine status should receive 1 dose of Tdap. This initial dose should be followed by tetanus and diphtheria toxoids (Td) booster doses every 10 years. Adults with an unknown or incomplete history  of completing a 3-dose immunization series with Td-containing vaccines should begin or complete a primary immunization series including a Tdap dose. Adults should receive a Td booster every 10 years.  Varicella vaccine. An adult without evidence of immunity to varicella should receive 2 doses or a second dose if he has previously received 1 dose.  Human papillomavirus (HPV) vaccine. Males aged 11-21 years who have not received the vaccine previously should receive the 3-dose series. Males aged 22-26 years may be immunized. Immunization is recommended through the age of 8 years for any male who has sex with males and did not get any or all doses earlier. Immunization is recommended for any person with an immunocompromised condition through the age of 32 years if he did not get any or all doses earlier. During the 3-dose series, the second dose should be obtained 4-8 weeks after the first dose. The third dose should be obtained 24 weeks after the first dose and 16 weeks after the second dose.  Zoster vaccine. One dose is recommended for adults aged 39 years or older unless certain conditions are present.  Measles, mumps, and rubella (MMR) vaccine. Adults born before [birth year] generally are considered immune to measles and mumps. Adults born in [birth year] or later should have 1 or more doses of MMR vaccine unless there is a contraindication to the vaccine or there is laboratory evidence of immunity to each of the three diseases. A routine second dose of MMR vaccine should be obtained at least 28 days after the first dose for students attending postsecondary schools, health care workers, or international travelers.  People who received inactivated measles vaccine or an unknown type of measles vaccine during 1963-1967 should receive 2 doses of MMR vaccine. People who received inactivated mumps vaccine or an unknown type of mumps vaccine before 1979 and are at high risk for mumps infection should consider immunization with 2 doses of MMR vaccine. Unvaccinated health care workers born before [birth year] who lack laboratory evidence of measles, mumps, or rubella immunity or laboratory confirmation of disease should consider measles and mumps immunization with 2 doses of MMR vaccine or rubella immunization with 1 dose of MMR vaccine.  Pneumococcal 13-valent conjugate (PCV13) vaccine. When indicated, a person who is uncertain of his immunization history and has no record of immunization should receive the PCV13 vaccine. All adults 79 years of age and older should receive this vaccine. An adult aged 5 years or older who has certain medical conditions and has not been previously immunized should receive 1 dose of PCV13 vaccine. This PCV13 should be followed with a dose of pneumococcal polysaccharide (PPSV23) vaccine. Adults who are at high risk for pneumococcal disease should obtain the PPSV23 vaccine at least 8 weeks after the dose of PCV13 vaccine. Adults older than 64 years of age who have normal immune system function should obtain the PPSV23 vaccine dose at least 1 year after the dose of PCV13 vaccine.  Pneumococcal polysaccharide (PPSV23) vaccine. When PCV13 is also indicated, PCV13 should be obtained first. All adults aged 2 years and older should be immunized. An adult younger than age 68 years who has certain medical conditions should be immunized. Any person who resides in a nursing home or long-term care facility should be immunized. An adult smoker should be immunized. People with an immunocompromised condition and certain other conditions should receive both PCV13 and PPSV23 vaccines. People with human immunodeficiency  virus (HIV) infection should be immunized as soon as possible after diagnosis. Immunization during chemotherapy  or radiation therapy should be avoided. Routine use of PPSV23 vaccine is not recommended for American Indians, [place] Natives, or people younger than 65 years unless there are medical conditions that require PPSV23 vaccine. When indicated, people who have unknown immunization and have no record of immunization should receive PPSV23 vaccine. One-time revaccination 5 years after the first dose of PPSV23 is recommended for people aged 19-64 years who have chronic kidney failure, nephrotic syndrome, asplenia, or immunocompromised conditions. People who received 1-2 doses of PPSV23 before age 38 years should receive another dose of PPSV23 vaccine at age 14 years or later if at least 5 years have passed since the previous dose. Doses of PPSV23 are not needed for people immunized with PPSV23 at or after age 51 years.  Meningococcal vaccine. Adults with asplenia or persistent complement component deficiencies should receive 2 doses of quadrivalent meningococcal conjugate (MenACWY-D) vaccine. The doses should be obtained at least 2 months apart. Microbiologists working with certain meningococcal bacteria, [employer] recruits, people at risk during an outbreak, and people who travel to or live in countries with a high rate of meningitis should be immunized. A first-year college student up through age 71 years who is living in a residence hall should receive a dose if he did not receive a dose on or after his 16th birthday. Adults who have certain high-risk conditions should receive one or more doses of vaccine.  Hepatitis A vaccine. Adults who wish to be protected from this disease, have chronic liver disease, work with hepatitis A-infected animals, work in hepatitis A research labs, or travel to or work in countries with a high rate of hepatitis A should be immunized. Adults who were previously unvaccinated and  who anticipate close contact with an international adoptee during the first 60 days after arrival in the [place] from a country with a high rate of hepatitis A should be immunized.  Hepatitis B vaccine. Adults should be immunized if they wish to be protected from this disease, are under age 69 years and have diabetes, have chronic liver disease, have had more than one sex partner in the past 6 months, may be exposed to blood or other infectious body fluids, are household contacts or sex partners of hepatitis B positive people, are clients or workers in certain care facilities, or travel to or work in countries with a high rate of hepatitis B.  Haemophilus influenzae type b (Hib) vaccine. A previously unvaccinated person with asplenia or sickle cell disease or having a scheduled splenectomy should receive 1 dose of Hib vaccine. Regardless of previous immunization, a recipient of a hematopoietic stem cell transplant should receive a 3-dose series 6-12 months after his successful transplant. Hib vaccine is not recommended for adults with HIV infection. Preventive Service / Frequency Ages 68 to 25  Blood pressure check.** / Every 3-5 years.  Lipid and cholesterol check.** / Every 5 years beginning at age 28.  Hepatitis C blood test.** / For any individual with known risks for hepatitis C.  Skin self-exam. / Monthly.  Influenza vaccine. / Every year.  Tetanus, diphtheria, and acellular pertussis (Tdap, Td) vaccine.** / Consult your health care provider. 1 dose of Td every 10 years.  Varicella vaccine.** / Consult your health care provider.  HPV vaccine. / 3 doses over 6 months, if 57 or younger.  Measles, mumps, rubella (MMR) vaccine.** / You need at least 1 dose of MMR if you were born in 1957 or later. You may also need a second dose.  Pneumococcal 13-valent conjugate (PCV13) vaccine.** / Consult your health care provider.  Pneumococcal polysaccharide (PPSV23) vaccine.** / 1 to 2 doses  if you smoke cigarettes or if you have certain conditions.  Meningococcal vaccine.** / 1 dose if you are age 30 to 49 years and a Market researcher living in a residence hall, or have one of several medical conditions. You may also need additional booster doses.  Hepatitis A vaccine.** / Consult your health care provider.  Hepatitis B vaccine.** / Consult your health care provider.  Haemophilus influenzae type b (Hib) vaccine.** / Consult your health care provider. Ages 22 to 67  Blood pressure check.** / Every year.  Lipid and cholesterol check.** / Every 5 years beginning at age 67.  Lung cancer screening. / Every year if you are aged 35-80 years and have a 30-pack-year history of smoking and currently smoke or have quit within the past 15 years. Yearly screening is stopped once you have quit smoking for at least 15 years or develop a health problem that would prevent you from having lung cancer treatment.  Fecal occult blood test (FOBT) of stool. / Every year beginning at age 86 and continuing until age 25. You may not have to do this test if you get a colonoscopy every 10 years.  Flexible sigmoidoscopy** or colonoscopy.** / Every 5 years for a flexible sigmoidoscopy or every 10 years for a colonoscopy beginning at age 50 and continuing until age 63.  Hepatitis C blood test.** / For all people born from [birth year] through 1965 and any individual with known risks for hepatitis C.  Skin self-exam. / Monthly.  Influenza vaccine. / Every year.  Tetanus, diphtheria, and acellular pertussis (Tdap/Td) vaccine.** / Consult your health care provider. 1 dose of Td every 10 years.  Varicella vaccine.** / Consult your health care provider.  Zoster vaccine.** / 1 dose for adults aged 70 years or older.  Measles, mumps, rubella (MMR) vaccine.** / You need at least 1 dose of MMR if you were born in 1957 or later. You may also need a second dose.  Pneumococcal 13-valent conjugate (PCV13)  vaccine.** / Consult your health care provider.  Pneumococcal polysaccharide (PPSV23) vaccine.** / 1 to 2 doses if you smoke cigarettes or if you have certain conditions.  Meningococcal vaccine.** / Consult your health care provider.  Hepatitis A vaccine.** / Consult your health care provider.  Hepatitis B vaccine.** / Consult your health care provider.  Haemophilus influenzae type b (Hib) vaccine.** / Consult your health care provider. Ages 48 and over  Blood pressure check.** / Every year.  Lipid and cholesterol check.**/ Every 5 years beginning at age 25.  Lung cancer screening. / Every year if you are aged 65-80 years and have a 30-pack-year history of smoking and currently smoke or have quit within the past 15 years. Yearly screening is stopped once you have quit smoking for at least 15 years or develop a health problem that would prevent you from having lung cancer treatment.  Fecal occult blood test (FOBT) of stool. / Every year beginning at age 54 and continuing until age 28. You may not have to do this test if you get a colonoscopy every 10 years.  Flexible sigmoidoscopy** or colonoscopy.** / Every 5 years for a flexible sigmoidoscopy or every 10 years for a colonoscopy beginning at age 26 and continuing until age 73.  Hepatitis C blood test.** / For all people born from [birth year] through 1966 and any individual with known risks  for hepatitis C.  Abdominal aortic aneurysm (AAA) screening.** / A one-time screening for ages 33 to 32 years who are current or former smokers.  Skin self-exam. / Monthly.  Influenza vaccine. / Every year.  Tetanus, diphtheria, and acellular pertussis (Tdap/Td) vaccine.** / 1 dose of Td every 10 years.  Varicella vaccine.** / Consult your health care provider.  Zoster vaccine.** / 1 dose for adults aged 85 years or older.  Pneumococcal 13-valent conjugate (PCV13) vaccine.** / 1 dose for all adults aged 48 years and older.  Pneumococcal  polysaccharide (PPSV23) vaccine.** / 1 dose for all adults aged 9 years and older.  Meningococcal vaccine.** / Consult your health care provider.  Hepatitis A vaccine.** / Consult your health care provider.  Hepatitis B vaccine.** / Consult your health care provider.  Haemophilus influenzae type b (Hib) vaccine.** / Consult your health care provider. **Family history and personal history of risk and conditions may change your health care provider's recommendations.   This information is not intended to replace advice given to you by your health care provider. Make sure you discuss any questions you have with your health care provider.   Document Released: 11/02/2001 Document Revised: 09/27/2014 Document Reviewed: 02/01/2011 Elsevier Interactive Patient Education [employer].  Your goal blood pressure should be 135/85 or less on a regular basis, her medications should be started.  Normal blood pressure is 120/80 or less.    Hypertension Hypertension, commonly called high blood pressure, is when the force of blood pumping through the arteries is too strong. The arteries are the blood vessels that carry blood from the heart throughout the body. Hypertension forces the heart to work harder to pump blood and may cause arteries to become narrow or stiff. Having untreated or uncontrolled hypertension can cause heart attacks, strokes, kidney disease, and other problems. A blood pressure reading consists of a higher number over a lower number. Ideally, your blood pressure should be below 120/80. The first ("top") number is called the systolic pressure. It is a measure of the pressure in your arteries as your heart beats. The second ("bottom") number is called the diastolic pressure. It is a measure of the pressure in your arteries as the heart relaxes. What are the causes? The cause of this condition is not known. What increases the risk? Some risk factors for high blood pressure are under  your control. Others are not. Factors you can change  Smoking.  Having type 2 diabetes mellitus, high cholesterol, or both.  Not getting enough exercise or physical activity.  Being overweight.  Having too much fat, sugar, calories, or salt (sodium) in your diet.  Drinking too much alcohol. Factors that are difficult or impossible to change  Having chronic kidney disease.  Having a family history of high blood pressure.  Age. Risk increases with age.  Race. You may be at higher risk if you are African-American.  Gender. Men are at higher risk than women before age 69. After age 67, women are at higher risk than men.  Having obstructive sleep apnea.  Stress. What are the signs or symptoms? Extremely high blood pressure (hypertensive crisis) may cause:  Headache.  Anxiety.  Shortness of breath.  Nosebleed.  Nausea and vomiting.  Severe chest pain.  Jerky movements you cannot control (seizures).  How is this diagnosed? This condition is diagnosed by measuring your blood pressure while you are seated, with your arm resting on a surface. The cuff of the blood pressure monitor will be placed  directly against the skin of your upper arm at the level of your heart. It should be measured at least twice using the same arm. Certain conditions can cause a difference in blood pressure between your right and left arms. Certain factors can cause blood pressure readings to be lower or higher than normal (elevated) for a short period of time:  When your blood pressure is higher when you are in a health care provider's office than when you are at home, this is called white coat hypertension. Most people with this condition do not need medicines.  When your blood pressure is higher at home than when you are in a health care provider's office, this is called masked hypertension. Most people with this condition may need medicines to control blood pressure.  If you have a high blood  pressure reading during one visit or you have normal blood pressure with other risk factors:  You may be asked to return on a different day to have your blood pressure checked again.  You may be asked to monitor your blood pressure at home for 1 week or longer.  If you are diagnosed with hypertension, you may have other blood or imaging tests to help your health care provider understand your overall risk for other conditions. How is this treated? This condition is treated by making healthy lifestyle changes, such as eating healthy foods, exercising more, and reducing your alcohol intake. Your health care provider may prescribe medicine if lifestyle changes are not enough to get your blood pressure under control, and if:  Your systolic blood pressure is above 130.  Your diastolic blood pressure is above 80.  Your personal target blood pressure may vary depending on your medical conditions, your age, and other factors. Follow these instructions at home: Eating and drinking  Eat a diet that is high in fiber and potassium, and low in sodium, added sugar, and fat. An example eating plan is called the DASH (Dietary Approaches to Stop Hypertension) diet. To eat this way: ? Eat plenty of fresh fruits and vegetables. Try to fill half of your plate at each meal with fruits and vegetables. ? Eat whole grains, such as whole wheat pasta, brown rice, or whole grain bread. Fill about one quarter of your plate with whole grains. ? Eat or drink low-fat dairy products, such as skim milk or low-fat yogurt. ? Avoid fatty cuts of meat, processed or cured meats, and poultry with skin. Fill about one quarter of your plate with lean proteins, such as fish, chicken without skin, beans, eggs, and tofu. ? Avoid premade and processed foods. These tend to be higher in sodium, added sugar, and fat.  Reduce your daily sodium intake. Most people with hypertension should eat less than 1,500 mg of sodium a day.  Limit  alcohol intake to no more than 1 drink a day for nonpregnant women and 2 drinks a day for men. One drink equals 12 oz of beer, 5 oz of wine, or 1 oz of hard liquor. Lifestyle  Work with your health care provider to maintain a healthy body weight or to lose weight. Ask what an ideal weight is for you.  Get at least 30 minutes of exercise that causes your heart to beat faster (aerobic exercise) most days of the week. Activities may include walking, swimming, or biking.  Include exercise to strengthen your muscles (resistance exercise), such as pilates or lifting weights, as part of your weekly exercise routine. Try to do these types  of exercises for 30 minutes at least 3 days a week.  Do not use any products that contain nicotine or tobacco, such as cigarettes and e-cigarettes. If you need help quitting, ask your health care provider.  Monitor your blood pressure at home as told by your health care provider.  Keep all follow-up visits as told by your health care provider. This is important. Medicines  Take over-the-counter and prescription medicines only as told by your health care provider. Follow directions carefully. Blood pressure medicines must be taken as prescribed.  Do not skip doses of blood pressure medicine. Doing this puts you at risk for problems and can make the medicine less effective.  Ask your health care provider about side effects or reactions to medicines that you should watch for. Contact a health care provider if:  You think you are having a reaction to a medicine you are taking.  You have headaches that keep coming back (recurring).  You feel dizzy.  You have swelling in your ankles.  You have trouble with your vision. Get help right away if:  You develop a severe headache or confusion.  You have unusual weakness or numbness.  You feel faint.  You have severe pain in your chest or abdomen.  You vomit repeatedly.  You have trouble  breathing. Summary  Hypertension is when the force of blood pumping through your arteries is too strong. If this condition is not controlled, it may put you at risk for serious complications.  Your personal target blood pressure may vary depending on your medical conditions, your age, and other factors. For most people, a normal blood pressure is less than 120/80.  Hypertension is treated with lifestyle changes, medicines, or a combination of both. Lifestyle changes include weight loss, eating a healthy, low-sodium diet, exercising more, and limiting alcohol. This information is not intended to replace advice given to you by your health care provider. Make sure you discuss any questions you have with your health care provider. Document Released: 09/06/2005 Document Revised: 08/04/2016 Document Reviewed: 08/04/2016 Elsevier Interactive Patient Education  2018 [employer].    How to Take Your Blood Pressure   Blood pressure is a measurement of how strongly your blood is pressing against the walls of your arteries. Arteries are blood vessels that carry blood from your heart throughout your body. Your health care provider takes your blood pressure at each office visit. You can also take your own blood pressure at home with a blood pressure machine. You may need to take your own blood pressure:  To confirm a diagnosis of high blood pressure (hypertension).  To monitor your blood pressure over time.  To make sure your blood pressure medicine is working.  Supplies needed: To take your blood pressure, you will need a blood pressure machine. You can buy a blood pressure machine, or blood pressure monitor, at most drugstores or online. There are several types of home blood pressure monitors. When choosing one, consider the following:  Choose a monitor that has an arm cuff.  Choose a monitor that wraps snugly around your upper arm. You should be able to fit only one finger between your arm and  the cuff.  Do not choose a monitor that measures your blood pressure from your wrist or finger.  Your health care provider can suggest a reliable monitor that will meet your needs. How to prepare To get the most accurate reading, avoid the following for 30 minutes before you check your blood pressure:  Drinking caffeine.  Drinking alcohol.  Eating.  Smoking.  Exercising.  Five minutes before you check your blood pressure:  Empty your bladder.  Sit quietly without talking in a dining chair, rather than in a soft couch or armchair.  How to take your blood pressure To check your blood pressure, follow the instructions in the manual that came with your blood pressure monitor. If you have a digital blood pressure monitor, the instructions may be as follows: 1. Sit up straight. 2. Place your feet on the floor. Do not cross your ankles or legs. 3. Rest your left arm at the level of your heart on a table or desk or on the arm of a chair. 4. Pull up your shirt sleeve. 5. Wrap the blood pressure cuff around the upper part of your left arm, 1 inch (2.5 cm) above your elbow. It is best to wrap the cuff around bare skin. 6. Fit the cuff snugly around your arm. You should be able to place only one finger between the cuff and your arm. 7. Position the cord inside the groove of your elbow. 8. Press the power button. 9. Sit quietly while the cuff inflates and deflates. 10. Read the digital reading on the monitor screen and write it down (record it). 11. Wait 2-3 minutes, then repeat the steps, starting at step 1.  What does my blood pressure reading mean? A blood pressure reading consists of a higher number over a lower number. Ideally, your blood pressure should be below 120/80. The first ("top") number is called the systolic pressure. It is a measure of the pressure in your arteries as your heart beats. The second ("bottom") number is called the diastolic pressure. It is a measure of the  pressure in your arteries as the heart relaxes. Blood pressure is classified into four stages. The following are the stages for adults who do not have a short-term serious illness or a chronic condition. Systolic pressure and diastolic pressure are measured in a unit called mm Hg. Normal  Systolic pressure: below 574.  Diastolic pressure: below 80. Elevated  Systolic pressure: 734-037.  Diastolic pressure: below 80. Hypertension stage 1  Systolic pressure: 096-438.  Diastolic pressure: 38-18. Hypertension stage 2  Systolic pressure: 403 or above.  Diastolic pressure: 90 or above. You can have prehypertension or hypertension even if only the systolic or only the diastolic number in your reading is higher than normal. Follow these instructions at home:  Check your blood pressure as often as recommended by your health care provider.  Take your monitor to the next appointment with your health care provider to make sure: ? That you are using it correctly. ? That it provides accurate readings.  Be sure you understand what your goal blood pressure numbers are.  Tell your health care provider if you are having any side effects from blood pressure medicine. Contact a health care provider if:  Your blood pressure is consistently high. Get help right away if:  Your systolic blood pressure is higher than 180.  Your diastolic blood pressure is higher than 110. This information is not intended to replace advice given to you by your health care provider. Make sure you discuss any questions you have with your health care provider. Document Released: 02/13/2016 Document Revised: 04/27/2016 Document Reviewed: 02/13/2016 Elsevier Interactive Patient Education  [employer].

## 2019-09-19 NOTE — Progress Notes (Signed)
Male physical  Impression and Recommendations:    1. Encounter for wellness examination   2. Essential hypertension   3. Hypertensive heart disease, unspecified whether heart failure present   4. Hyperlipidemia, unspecified hyperlipidemia type   5. Takotsubo cardiomyopathy   6. Malignant neoplasm of prostate ([place])   7. Health education/counseling   8. Need for influenza vaccination   9. Need for shingles vaccine     - Last seen in office for CPE 07/19/2018.  1) Anticipatory Guidance: Discussed importance of wearing a seatbelt while driving, not texting while driving;   sunscreen when outside along with skin surveillance; eating a balanced and modest diet; physical activity at least 25 minutes per day or 150 min/ week moderate to intense activity.  - Patient will obtain rectal / prostate and genitalia exam yearly through Urologist.  - Encouraged patient to engage in self-testicular screening at home.  - Reviewed goal BP with patient during appointment today. - Strongly encouraged patient to continue ambulatory BP monitoring at home.   - Informational handout provided today regarding BP monitoring and HTN prevention.  - Patient last saw Dr. [name] of cardiology in January. - Per patient, seeing Cardiology in February and scheduled with Neurology. - Discussed critical importance of following up with specialists as advised, to help reduce health risks.  - Health counseling and education provided, and all questions answered. - Will send recent lab work to cardiology.  Encouraged patient to bring copy to specialist.  - Discussed prudent use of periodic suppressive therapy on valtrex.  2) Immunizations / Screenings / Labs:   All immunizations are up-to-date per recommendations or will be updated today. Patient is due for dental and vision screens which pt will schedule independently. Will obtain CBC, CMP, HgA1c, Lipid panel, TSH and vit D when fasting, if not already done  recently.   - Last colonoscopy obtained 11 of 2016, normal, with repeat 10 years. - Per patient, last had TDAP with Dr. Brigitte Pulse, tracking down those records.  - Education provided to patient today regarding need for all applicable vaccinations. - Discussed importance of obtaining shingles vaccine for preventative care/reduciton of shingles-related health risks.  - Patient agrees to shingles and flu vaccinations today.  3) Weight:   BMI meaning discussed with patient.  Discussed goal of losing 5-10% of current body weight which would improve overall feelings of well being and improve objective health data. Improve nutrient density of diet through increasing intake of fruits and vegetables and decreasing saturated fats, white flour products and refined sugars.      Orders Placed This Encounter  Procedures  . Varicella-zoster vaccine IM (Shingrix)  . Flu Vaccine QUAD 6+ mos PF IM (Fluarix Quad PF)  . CBC w/Diff  . CMP (comprehensive metabolic panel)    Order Specific Question:   Has the patient fasted?    Answer:   Yes  . TSH  . T4, free  . Hemoglobin A1c  . VITAMIN D 25 Hydroxy (Vit-D Deficiency, Fractures)  . Lipid panel    Order Specific Question:   Has the patient fasted?    Answer:   Yes  . Hepatitis C Antibody    Meds ordered this encounter  Medications  . valACYclovir (VALTREX) 1000 MG tablet    Sig: One tablet po TID PRN    Dispense:  9 tablet    Refill:  3     Return in about 4 months (around 01/18/2020).    Gross side effects, risk  and benefits, and alternatives of medications discussed with patient.  Patient is aware that all medications have potential side effects and we are unable to predict every side effect or drug-drug interaction that may occur.  Expresses verbal understanding and consents to current therapy plan and treatment regimen.  Please see AVS handed out to patient at the end of our visit for further patient instructions/ counseling done pertaining to  today's office visit.  Follow-up preventative CPE in 1 year. Follow-up office visit - may be needed pending lab work.   - F/up sooner for chronic care management as above and/or prn  This document serves as a record of services personally performed by [name], DO. It was created on her behalf by Toni Amend, a trained medical scribe. The creation of this record is based on the scribe's personal observations and the provider's statements to them.   This case required medical decision making of at least moderate complexity. The above documentation has been reviewed to be accurate and was completed by Marjory Sneddon, D.O.     Subjective:    I, Toni Amend, am serving as scribe for Dr. [name].  CC: CPE  HPI: Dennis Lewis is a 63 y.o. male who presents to [place] [facility] today for a yearly health maintenance exam.     Health Maintenance Summary Reviewed and updated, unless pt declines services.  Colonoscopy:   Denies family history of colon cancer. Tobacco History Reviewed:  Former smoker; quit June 4, of 1980. Alcohol:  Patient with a history of heavy drinking, 4-6 beers per day for many years.  Notes he now drinks more rarely; states he doesn't even buy beer for his house anymore.  Says he hasn't stopped drinking; he doesn't drink liquor, but continues drinking beer occasionally. Exercise Habits:  Working on increasing his physical activity.  He hasn't walked the last two weeks due to the cold, but he usually walks an hour every day.  Notes he likes exercising outside. STD concerns:   None reported. Drug Use:   None reported. Birth control method:  None reported. Testicular/penile concerns:  None reported.  - S/P Radiation Therapy for Prostate CA Denies family history of prostate cancer; patient is s/p prostate cancer and continues to follow up with urology.  Says his PSA was down the last time he   - [place] his  last eye exam was about a year ago. He goes yearly.  - Dental Health He goes to the dentist often.  - Dermatological Health Follows up with dermatology for screenings; Dr. Nevada Crane.  Denies family history of skin cancer.  - General Health Denies chest pain, SOB.  - Atrial Flutter 10-15 years, when everything started, notes he felt his flutter every single day; "it was horrible."  Says he hasn't felt it lately, "and that's made me so happy."  Says it used to be so bad that it scared him.  - Blood Pressure He measures his blood pressure at the grocery store, often after physical activity.  Says his blood pressure is always different, sometimes high normal, sometimes regular normal.  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  Notes that valtrex does work to keep his symptoms controlled.   Immunization History  Administered Date(s) Administered  . Influenza,inj,Quad PF,6+ Mos 09/19/2019  . Zoster Recombinat (Shingrix) 09/19/2019    Health Maintenance  Topic Date Due  . Samul Dada  11/01/1974  .  COLONOSCOPY  07/28/2025  . INFLUENZA VACCINE  Completed  . Hepatitis C Screening  Completed  . HIV Screening  Completed      Wt Readings from Last 3 Encounters:  09/19/19 195 lb 6.4 oz (88.6 kg)  10/10/18 193 lb 6.4 oz (87.7 kg)  09/18/18 191 lb 6.4 oz (86.8 kg)   BP Readings from Last 3 Encounters:  09/19/19 (!) 145/85  10/10/18 128/80  09/18/18 138/78   Pulse Readings from Last 3 Encounters:  09/19/19 76  10/10/18 79  09/18/18 87    Patient Active Problem List   Diagnosis Date Noted  . Alcohol consumption heavy- 4-6 beer/d many yrs 07/02/2018  . HLD (hyperlipidemia) 06/07/2018  . Atrial flutter ([place]) 08/26/2016  . 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 Essential hypertension 07/19/2013  . Malignant neoplasm of prostate ([place]) 05/14/2014  . GERD (gastroesophageal reflux disease)  06/07/2018  . S/P radiation therapy for prostate CA 06/07/2018  . Left bundle branch block 07/19/2013  . Pain in right knee 07/25/2019  . Atrial fibrillation with RVR ([place]) 09/17/2018  . Acute on chronic systolic CHF (congestive heart failure) ([place]) 09/17/2018  . 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 Chest pain   . Elevated troponin   . Hypertensive heart disease 04/23/2016  . NSVT (nonsustained ventricular tachycardia) ([place]) 08/21/2015  . Palpitations 07/19/2013    Past Medical History:  Diagnosis Date  . A-fib ([place])   . Anxiety   . Blood clot in vein 2017  . Cardiomyopathy due to hypertension, without heart failure ([place])   . 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 GERD (gastroesophageal reflux disease)   . H/O cardiovascular stress test 2010   [name]  . H/O echocardiogram 2010    mild LVH with some septal dyssynergy, EF 45%, impaired relaxation  . History of prostate cancer   . Hyperlipidemia   . Hypertension   . Left bundle branch block   . NSVT (nonsustained ventricular tachycardia) ([place])   . Palpitations   . Prostate cancer ([place]) 04/02/14   Gleason 7, volume 30 gm  . S/P radiation therapy 06/26/2014 through 08/22/2014                                                      Prostate 7800 cGy in 40 sessions                        . Torn tendon    right shoulder    Past Surgical History:  Procedure Laterality Date  . BACK SURGERY  1993  . CARDIAC CATHETERIZATION  1990   Normal cors  . CARDIAC CATHETERIZATION N/A 06/06/2016   Procedure: Left Heart Cath and Coronary Angiography;  Surgeon: Jettie Booze, MD;  Location: [facility];  Service: Cardiovascular;  Laterality: N/A;  . PROSTATE BIOPSY  04/02/14   Gleason 7, vol 30 gm    Family History  Problem Relation Age of Onset  . Throat cancer Father 65       smoker  . Esophageal cancer Father   . Prostate cancer Brother        surgery  . Colon cancer Neg Hx   . Diabetes Neg Hx    . Colon polyps Neg Hx     Social History   Substance and Sexual Activity  Drug Use No  ,  Social History   Substance and Sexual Activity  Alcohol Use Yes  . Alcohol/week: 28.0 standard drinks  . Types: 28 Cans of beer per week   Comment: beer daily - 4 daily  ,  Social History   Tobacco Use  Smoking Status Former Smoker  . Types: Cigarettes  . Quit date: 02/22/1979  . Years since quitting: 40.6  Smokeless Tobacco Never Used  ,  Social History   Substance and Sexual Activity  Sexual Activity Not Currently    Patient's Medications  New Prescriptions   No medications on file  Previous Medications   COLCHICINE 0.6 MG TABLET    Take 1 tablet (0.6 mg total) by mouth daily.   ELIQUIS 5 MG TABS TABLET    Take 1 tablet by mouth twice daily   ENTRESTO 97-103 MG    Take 1 tablet by mouth twice daily   FEXOFENADINE (ALLEGRA) 180 MG TABLET    Take 180 mg by mouth daily.   FUROSEMIDE (LASIX) 20 MG TABLET    Take 2 tablets by mouth once daily   ISOSORBIDE MONONITRATE (IMDUR) 30 MG 24 HR TABLET    TAKE 1 TABLET BY MOUTH DAILY. DO NOT CRUSH   METOPROLOL SUCCINATE (TOPROL-XL) 100 MG 24 HR TABLET    Take 1 tablet (100 mg total) by mouth every morning AND 1 tablet (100 mg total) every evening. Take 100 mg every morning.   NITROGLYCERIN (NITROSTAT) 0.4 MG SL TABLET    Place 1 tablet under the tongue every 5 (five) minutes as needed for chest pain. X 3 doses   POTASSIUM CHLORIDE (K-DUR) 10 MEQ TABLET    Take 1 tablet (10 mEq total) by mouth daily.   POTASSIUM CHLORIDE (KLOR-CON) 10 MEQ TABLET    TAKE 1  BY MOUTH TWICE DAILY   ROSUVASTATIN (CRESTOR) 40 MG TABLET    Take 1 tablet by mouth once daily   SPIRONOLACTONE (ALDACTONE) 25 MG TABLET    TAKE 1/2 (ONE-HALF) TABLET BY MOUTH ONCE DAILY IN THE MORNING  Modified Medications   Modified Medication Previous Medication   VALACYCLOVIR (VALTREX) 1000 MG TABLET valACYclovir (VALTREX) 1000 MG tablet      One tablet po TID PRN    TAKE ONE TABLET BY  MOUTH DAILY FOR 3 DAYS AT FIRST ONSET OF SYMPTOMS  Discontinued Medications   No medications on file    Chocolate flavor, Ace inhibitors, Chocolate, Other, Peanut-containing drug products, Tramadol, and Hydralazine  Review of Systems: General:   Denies fever, chills, unexplained weight loss.  Optho/Auditory:   Denies visual changes, blurred vision/LOV Respiratory:   Denies SOB, DOE more than baseline levels.   Cardiovascular:   Denies chest pain, palpitations, new onset peripheral edema  Gastrointestinal:   Denies nausea, vomiting, diarrhea.  Genitourinary: Denies dysuria, freq/ urgency, flank pain or discharge from genitals.  Endocrine:     Denies hot or cold intolerance, polyuria, polydipsia. Musculoskeletal:   Denies unexplained myalgias, joint swelling, unexplained arthralgias, gait problems.  Skin:  Denies rash, suspicious lesions Neurological:     Denies dizziness, unexplained weakness, numbness  Psychiatric/Behavioral:   Denies mood changes, suicidal or homicidal ideations, hallucinations    Objective:     Blood pressure (!) 145/85, pulse 76, temperature 97.8 F (36.6 C), temperature source Oral, resp. rate 10, height 5\' 11"  (1.803 m), weight 195 lb 6.4 oz (88.6 kg), SpO2 100 %. Body mass index is 27.25 kg/m. General Appearance:    Alert, cooperative, no  distress, appears stated age  Head:    Normocephalic, without obvious abnormality, atraumatic  Eyes:    PERRL, conjunctiva/corneas clear, EOM's intact, fundi    benign, both eyes  Ears:    Normal TM's and external ear canals, both ears  Nose:   Nares normal, septum midline, mucosa normal, no drainage    or sinus tenderness  Throat:   Lips w/o lesion, mucosa moist, and tongue normal; teeth and   gums normal  Neck:   Supple, symmetrical, trachea midline, no adenopathy;    thyroid:  no enlargement/tenderness/nodules; no carotid   bruit or JVD  Back:     Symmetric, no curvature, ROM normal, no CVA tenderness  Lungs:      Clear to auscultation bilaterally, respirations unlabored, no       Wh/ R/ R  Chest Wall:    No tenderness or gross deformity; normal excursion   Heart:    Regular rate and rhythm, S1 and S2 normal, no murmur, rub   or gallop  Abdomen:      Abdominal hernia present in supraumbilical region along linea alba, w protrusion only with increased abdominal pressure.  Otherwise soft, non-tender, bowel sounds active all four quadrants, NO G/R/R, no masses, no organomegaly  Genitalia:    Deferred to Urologist who patient is going to see in two weeks.  Rectal:    Deferred to Urologist who patient is going to see in two weeks.  Extremities:   Extremities normal, atraumatic, no cyanosis or gross edema  Pulses:   2+ and symmetric all extremities  Skin:   Warm, dry, Skin color, texture, turgor normal, no obvious rashes or lesions  M-Sk:   Ambulates * 4 w/o difficulty, no gross deformities, tone WNL  Neurologic:   CNII-XII intact, normal strength, sensation and reflexes    Throughout Psych:  No HI/SI, judgement and insight good, Euthymic mood. Full Affect.

## 2019-09-20 LAB — CBC WITH DIFFERENTIAL/PLATELET
Basophils Absolute: 0.1 10*3/uL (ref 0.0–0.2)
Basos: 1 %
EOS (ABSOLUTE): 0.3 10*3/uL (ref 0.0–0.4)
Eos: 4 %
Hematocrit: 45.9 % (ref 37.5–51.0)
Hemoglobin: 16 g/dL (ref 13.0–17.7)
Immature Grans (Abs): 0 10*3/uL (ref 0.0–0.1)
Immature Granulocytes: 0 %
Lymphocytes Absolute: 1.7 10*3/uL (ref 0.7–3.1)
Lymphs: 28 %
MCH: 33.8 pg — ABNORMAL HIGH (ref 26.6–33.0)
MCHC: 34.9 g/dL (ref 31.5–35.7)
MCV: 97 fL (ref 79–97)
Monocytes Absolute: 0.7 10*3/uL (ref 0.1–0.9)
Monocytes: 11 %
Neutrophils Absolute: 3.4 10*3/uL (ref 1.4–7.0)
Neutrophils: 56 %
Platelets: 182 10*3/uL (ref 150–450)
RBC: 4.73 x10E6/uL (ref 4.14–5.80)
RDW: 13 % (ref 11.6–15.4)
WBC: 6.2 10*3/uL (ref 3.4–10.8)

## 2019-09-20 LAB — COMPREHENSIVE METABOLIC PANEL
ALT: 36 IU/L (ref 0–44)
AST: 29 IU/L (ref 0–40)
Albumin/Globulin Ratio: 1.6 (ref 1.2–2.2)
Albumin: 4.4 g/dL (ref 3.8–4.8)
Alkaline Phosphatase: 74 IU/L (ref 39–117)
BUN/Creatinine Ratio: 20 (ref 10–24)
BUN: 20 mg/dL (ref 8–27)
Bilirubin Total: 0.4 mg/dL (ref 0.0–1.2)
CO2: 24 mmol/L (ref 20–29)
Calcium: 9.9 mg/dL (ref 8.6–10.2)
Chloride: 102 mmol/L (ref 96–106)
Creatinine, Ser: 0.99 mg/dL (ref 0.76–1.27)
GFR calc Af Amer: 93 mL/min/{1.73_m2} (ref >59)
GFR calc non Af Amer: 80 mL/min/{1.73_m2} (ref >59)
Globulin, Total: 2.7 g/dL (ref 1.5–4.5)
Glucose: 106 mg/dL — ABNORMAL HIGH (ref 65–99)
Potassium: 4.7 mmol/L (ref 3.5–5.2)
Sodium: 141 mmol/L (ref 134–144)
Total Protein: 7.1 g/dL (ref 6.0–8.5)

## 2019-09-20 LAB — LIPID PANEL
Chol/HDL Ratio: 2.6 ratio (ref 0.0–5.0)
Cholesterol, Total: 150 mg/dL (ref 100–199)
HDL: 57 mg/dL (ref >39)
LDL Chol Calc (NIH): 64 mg/dL (ref 0–99)
Triglycerides: 174 mg/dL — ABNORMAL HIGH (ref 0–149)
VLDL Cholesterol Cal: 29 mg/dL (ref 5–40)

## 2019-09-20 LAB — HEMOGLOBIN A1C
Est. average glucose Bld gHb Est-mCnc: 114 mg/dL
Hgb A1c MFr Bld: 5.6 % (ref 4.8–5.6)

## 2019-09-20 LAB — TSH: TSH: 1.38 u[IU]/mL (ref 0.450–4.500)

## 2019-09-20 LAB — T4, FREE: Free T4: 1.58 ng/dL (ref 0.82–1.77)

## 2019-09-20 LAB — VITAMIN D 25 HYDROXY (VIT D DEFICIENCY, FRACTURES): Vit D, 25-Hydroxy: 40.6 ng/mL (ref 30.0–100.0)

## 2019-09-20 LAB — HEPATITIS C ANTIBODY: Hep C Virus Ab: 0.1 s/co ratio (ref 0.0–0.9)

## 2019-10-05 ENCOUNTER — Encounter: Payer: Self-pay | Admitting: Family Medicine

## 2019-10-15 NOTE — Progress Notes (Signed)
Cardiology Office Note    Date:  10/22/2019   ID:  Dennis [facility], DOB [birth year], MRN [id number]  PCP:  [name], DO  Cardiologist:  Dr. Ted Leonhart [name]    History of Present Illness:  Dennis Lewis is a 64 y.o. male seen for follow up CHF. He has a PMH of hypertension, GERD, hyperlipidemia, chronic LBBB, history of NSVT, prostate cancer s/p radiation therapy, persistent atrial flutter, and NICM with EF 35-40%. He has a history of Etoh abuse.  He was admitted for NSTEMI on 06/06/2016, he underwent emergent cardiac catheterization which only showed 30% proximal RCA disease, 25-35% ejection fraction, pattern of apical wall motion abnormality.  Surprisingly, his troponin peaked at 31.36 which is a lot higher than expected for stress-induced cardiomyopathy. Retrospectively, it is possible that he had an embolic MI.  Imdur and hydralazine were added. Initial echo obtained in September showed ejection fraction 25-30%. He was also noted to have new atrial fibrillation, due to elevated CHA2DS2-Vasc score, he was started on eliquis. Repeat echocardiogram 6 weeks later on 07/20/2016 showed ejection fraction has improved to [id number], grade 3 diastolic dysfunction, paradoxical ventricular septal motion consistent with LBBB.   He was seen in May 2019  for preoperative clearance prior to right elbow and upper arm procedure. Repeat Echo was ordered but rescheduled multiple times and finally performed in September 2019. This demonstrated reduced LV function with EF 25-30% .  His persistent atrial fib/flutter  rate was controlled. He was started on Entresto and dose titrated by Pharm D. He was admitted overnight with CHF exacerbation on 09/16/18. Lasix had been recently reduced due to gout. Afib rate was elevated.  He was seen in January 2020 and spironolactone was increased. Since then we have worked on optimizing medical therapy  On follow up today he is doing well. He is tolerating medication well. He states he is  working too hard and hasn't found much time to exercise since October. He is abstinent from Etoh. Denies orthopnea, PND, palpitations, dizziness. No chest pain. Only SOB if he really pushes it.      Past Medical History:  Diagnosis Date  . A-fib ([place])   . Anxiety   . Blood clot in vein 2017  . Cardiomyopathy due to hypertension, without heart failure ([place])   . Congestive dilated cardiomyopathy ([place]) 04/23/2016  . GERD (gastroesophageal reflux disease)   . H/O cardiovascular stress test 2010   [name]  . H/O echocardiogram 2010    mild LVH with some septal dyssynergy, EF 45%, impaired relaxation  . History of prostate cancer   . Hyperlipidemia   . Hypertension   . Left bundle branch block   . NSVT (nonsustained ventricular tachycardia) ([place])   . Palpitations   . Prostate cancer ([place]) 04/02/14   Gleason 7, volume 30 gm  . S/P radiation therapy 06/26/2014 through 08/22/2014                                                      Prostate 7800 cGy in 40 sessions                        . Torn tendon    right shoulder    Past Surgical History:  Procedure Laterality Date  . BACK SURGERY  1993  .  CARDIAC CATHETERIZATION  1990   Normal cors  . CARDIAC CATHETERIZATION N/A 06/06/2016   Procedure: Left Heart Cath and Coronary Angiography;  Surgeon: Jettie Booze, MD;  Location: [facility];  Service: Cardiovascular;  Laterality: N/A;  . PROSTATE BIOPSY  04/02/14   Gleason 7, vol 30 gm    Current Medications: Allergies as of 10/22/2019      Reactions   Chocolate Flavor Swelling   Ace Inhibitors Other (See Comments)   Cold sores   Chocolate Other (See Comments)   Blisters   Other Other (See Comments)   Seed on bread causes lip blisters Anesthesia also, but does nor remember which one   Peanut-containing Drug Products Other (See Comments)   Blisters   Tramadol    Hydralazine Palpitations   Irregular heartbeat, fluttering      Medication List       Accurate  as of October 22, 2019  8:27 AM. If you have any questions, ask your nurse or doctor.        colchicine 0.6 MG tablet Take 1 tablet (0.6 mg total) by mouth daily.   Eliquis 5 MG Tabs tablet Generic drug: apixaban Take 1 tablet by mouth twice daily   Entresto 97-103 MG Generic drug: sacubitril-valsartan Take 1 tablet by mouth twice daily   fexofenadine 180 MG tablet Commonly known as: ALLEGRA Take 180 mg by mouth daily.   furosemide 20 MG tablet Commonly known as: LASIX Take 2 tablets by mouth once daily   isosorbide mononitrate 30 MG 24 hr tablet Commonly known as: IMDUR TAKE 1 TABLET BY MOUTH DAILY. DO NOT CRUSH   metoprolol succinate 100 MG 24 hr tablet Commonly known as: TOPROL-XL Take 1 tablet (100 mg total) by mouth every morning AND 1 tablet (100 mg total) every evening. Take 100 mg every morning.   nitroGLYCERIN 0.4 MG SL tablet Commonly known as: NITROSTAT Place 1 tablet under the tongue every 5 (five) minutes as needed for chest pain. X 3 doses   potassium chloride 10 MEQ tablet Commonly known as: KLOR-CON Take 1 tablet (10 mEq total) by mouth daily.   potassium chloride 10 MEQ tablet Commonly known as: KLOR-CON TAKE 1  BY MOUTH TWICE DAILY   rosuvastatin 40 MG tablet Commonly known as: CRESTOR Take 1 tablet by mouth once daily   spironolactone 25 MG tablet Commonly known as: ALDACTONE TAKE 1/2 (ONE-HALF) TABLET BY MOUTH ONCE DAILY IN THE MORNING   valACYclovir 1000 MG tablet Commonly known as: VALTREX One tablet po TID PRN       Allergies:   Chocolate flavor, Ace inhibitors, Chocolate, Other, Peanut-containing drug products, Tramadol, and Hydralazine   Social History   Socioeconomic History  . Marital status: Single    Spouse name: Not on file  . Number of children: 2  . Years of education: Not on file  . Highest education level: Not on file  Occupational History  . Occupation: Nurse, adult  Tobacco Use  . Smoking status: Former  Smoker    Types: Cigarettes    Quit date: 02/22/1979    Years since quitting: 40.6  . Smokeless tobacco: Never Used  Substance and Sexual Activity  . Alcohol use: Yes    Alcohol/week: 28.0 standard drinks    Types: 28 Cans of beer per week    Comment: beer daily - 4 daily  . Drug use: No  . Sexual activity: Not Currently  Other Topics Concern  . Not on file  Social  History Narrative  . Not on file   Social Determinants of Health   Financial Resource Strain:   . Difficulty of Paying Living Expenses: Not on file  Food Insecurity:   . Worried About Charity fundraiser in the Last Year: Not on file  . Ran Out of Food in the Last Year: Not on file  Transportation Needs:   . Lack of Transportation (Medical): Not on file  . Lack of Transportation (Non-Medical): Not on file  Physical Activity:   . Days of Exercise per Week: Not on file  . Minutes of Exercise per Session: Not on file  Stress:   . Feeling of Stress : Not on file  Social Connections:   . Frequency of Communication with Friends and Family: Not on file  . Frequency of Social Gatherings with Friends and Family: Not on file  . Attends Religious Services: Not on file  . Active Member of Clubs or Organizations: Not on file  . Attends Archivist Meetings: Not on file  . Marital Status: Not on file     Family History:  The patient's family history includes Esophageal cancer in his father; Prostate cancer in his brother; Throat cancer (age of onset: 83) in his father.   ROS:   Please see the history of present illness.    ROSd All other systems reviewed and are negative.   PHYSICAL EXAM:   VS:  BP 128/76 (BP Location: Left Arm, Patient Position: Sitting, Cuff Size: Normal)   Pulse 62   Temp (!) 96.8 F (36 C)   Ht 5\' 9"  (1.753 m)   Wt 198 lb (89.8 kg)   BMI 29.24 kg/m   I get a pulse rate of 120 bpm.  GENERAL:  Well appearing WM in NAD HEENT:  PERRL, EOMI, sclera are clear. Oropharynx is clear. NECK:   No jugular venous distention, carotid upstroke brisk and symmetric, no bruits, no thyromegaly or adenopathy LUNGS:  Clear to auscultation bilaterally CHEST:  Unremarkable HEART:  IRRR,  PMI not displaced or sustained,S1 and S2 within normal limits, no S3, no S4: no clicks, no rubs, no murmurs ABD:  Soft, nontender. BS +, no masses or bruits. No hepatomegaly, no splenomegaly EXT:  2 + pulses throughout, no edema, no cyanosis no clubbing SKIN:  Warm and dry.  No rashes NEURO:  Alert and oriented x 3. Cranial nerves II through XII intact. PSYCH:  Cognitively intact    Wt Readings from Last 3 Encounters:  10/22/19 198 lb (89.8 kg)  09/19/19 195 lb 6.4 oz (88.6 kg)  10/10/18 193 lb 6.4 oz (87.7 kg)      Studies/Labs Reviewed:   EKG:  EKG is ordered today.  Atrial fib/flutter with rate 62. LBBB. I have personally reviewed and interpreted this study.    Recent Labs: 09/19/2019: ALT 36; BUN 20; Creatinine, Ser 0.99; Hemoglobin 16.0; Platelets 182; Potassium 4.7; Sodium 141; TSH 1.380   Lipid Panel    Component Value Date/Time   CHOL 150 09/19/2019 0912   TRIG 174 (H) 09/19/2019 0912   HDL 57 09/19/2019 0912   CHOLHDL 2.6 09/19/2019 0912   LDLCALC 64 09/19/2019 0912    Additional studies/ records that were reviewed today include:  Labs dated 02/16/17: cholesterol 255, triglycerides 291, HDL 44, LDL 153. Chemistries, CBC, TSH normal.   Cath 06/06/2016 Conclusion     Prox RCA lesion, 30 %stenosed at a bend.  The left ventricular ejection fraction is 25-35% by visual estimate. The  pattern of apical wall motion abnormality without severe CAD is suggestive of Takotsubo cardiomyopathy.  LV end diastolic pressure is moderately elevated.  There is no aortic valve stenosis.   Continue with aggressive medical therapy. He is allergic to ace inhibitors. Will add isosorbide to his hydralazine. Continue beta blocker. 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 He states that he was lying in bed for about 3 hours very worried about what he would do. He then started to have chest discomfort. The history is also consistent with Takatsubo cardiomyopathy.  May need diuresis given moderately elevated LVEDP.      Echo 07/20/2016 LV EF: 40% -   45%  Study Conclusions  - Left ventricle: The cavity size was normal. Systolic function was   mildly to moderately reduced. The estimated ejection fraction was   in the range of 40% to 45%. Diffuse hypokinesis. There was a   reduced contribution of atrial contraction to ventricular   filling, due to increased ventricular diastolic pressure or   atrial contractile dysfunction. Doppler parameters are consistent   with a reversible restrictive pattern, indicative of decreased   left ventricular diastolic compliance and/or increased left   atrial pressure (grade 3 diastolic dysfunction). - Ventricular septum: Septal motion showed moderate paradox. These   changes are consistent with intraventricular conduction delay. - Left atrium: The atrium was mildly dilated. - Pulmonary arteries: PA peak pressure: 40 mm Hg (S).  Impressions:  - The right ventricular systolic pressure was increased consistent   with mild pulmonary hypertension.  Echo 05/31/18: Study Conclusions  - Left ventricle: Systolic function was severely reduced. The   estimated ejection fraction was in the range of 25% to 30%. - Mitral valve: There was mild regurgitation.  ASSESSMENT:    1. Chronic systolic CHF (congestive heart failure) ([place])   2. Atrial flutter, unspecified type ([place])   3. Essential hypertension   4. LBBB (left bundle branch block)      PLAN:  In order of problems listed above:   1.  Chronic systolic CHF. NYHA functional class 2.  NICM:  Now on maximal medical therapy with Entresto, Toprol XL, spironolactone, and lasix.  HR is well controlled.  LV dysfunction is  multifactorial related to prior Etoh abuse, dyssynergy with LBBB, prior embolic event, Afib with uncontrolled rate. Stressed importance of abstinence from [place] and sodium restriction.  Will update Echo.   If EF remains low may be a candidate for CRT/ICD.   2. Chronic atrial fib/flutter: on eliquis 5mg  BID. CHA2DS2-Vasc score 2 (HTN, HF).  Rate is now well controlled.   3. Hypertension: controlled  4. Hyperlipidemia: at goal on statin therapy.    5.   Gout. No recurrent symptoms. Will switch colchicine to prn.   Medication Adjustments/Labs and Tests Ordered: Current medicines are reviewed at length with the patient today.  Concerns regarding medicines are outlined above.  Medication changes, Labs and Tests ordered today are listed in the Patient Instructions below. Patient Instructions  Change colchicine to once a day as needed for gout.   Increase your aerobic activity.   Follow up 6 months.   Signed, Wilmont Olund [name], MD  10/22/2019 8:27 AM    [facility] [address], [place], [place]  [address] Phone: [phone]; Fax: [phone]

## 2019-10-22 ENCOUNTER — Other Ambulatory Visit: Payer: Self-pay

## 2019-10-22 ENCOUNTER — Encounter: Payer: Self-pay | Admitting: Cardiology

## 2019-10-22 ENCOUNTER — Ambulatory Visit (INDEPENDENT_AMBULATORY_CARE_PROVIDER_SITE_OTHER): Payer: Medicare Other | Admitting: Cardiology

## 2019-10-22 VITALS — BP 128/76 | HR 62 | Temp 96.8°F | Ht 69.0 in | Wt 198.0 lb

## 2019-10-22 DIAGNOSIS — I4892 Unspecified atrial flutter: Secondary | ICD-10-CM

## 2019-10-22 DIAGNOSIS — I5022 Chronic systolic (congestive) heart failure: Secondary | ICD-10-CM | POA: Diagnosis not present

## 2019-10-22 DIAGNOSIS — I10 Essential (primary) hypertension: Secondary | ICD-10-CM | POA: Diagnosis not present

## 2019-10-22 DIAGNOSIS — I447 Left bundle-branch block, unspecified: Secondary | ICD-10-CM

## 2019-10-22 NOTE — Patient Instructions (Addendum)
Change colchicine to once a day as needed for gout.   Increase your aerobic activity.   Schedule Echo

## 2019-10-22 NOTE — Addendum Note (Signed)
Addended by: Kathyrn Lass on: 10/22/2019 08:49 AM   Modules accepted: Orders

## 2019-10-24 ENCOUNTER — Telehealth: Payer: Self-pay | Admitting: Family Medicine

## 2019-10-24 NOTE — Telephone Encounter (Signed)
Patient called and left VM asking to speak with Dr. Colman Cater medical assistant about some unspecified medical questions/concerns that he has.

## 2019-10-24 NOTE — Telephone Encounter (Signed)
Left message for patient to call back. AS, CMA 

## 2019-10-26 NOTE — Telephone Encounter (Signed)
Patient states that he has a productive cough with nasal congestion. No fever, No other symptoms. I advised patient that he has 2 symptoms of COVID 19 and that he should be tested for COVID and that we do not have any open acute slots for today nor do we do COVID testing. I advised patient he could be seen at [facility], patient said he was going to call [facility] to schedule apt for today. AS, CMA

## 2019-10-31 ENCOUNTER — Encounter (HOSPITAL_COMMUNITY): Payer: Self-pay | Admitting: Radiology

## 2019-11-07 ENCOUNTER — Other Ambulatory Visit: Payer: Self-pay | Admitting: Family Medicine

## 2019-11-07 MED ORDER — FEXOFENADINE HCL 180 MG PO TABS: 180.0000 mg | ORAL_TABLET | Freq: Every day | ORAL | 1 refills | Status: DC

## 2019-11-07 NOTE — Telephone Encounter (Signed)
Patient called states prior provider always refilled his Rx for :  fexofenadine (ALLEGRA) 180 MG tablet [id number]   Order Details Dose: 180 mg Route: Oral Frequency: Daily  Dispense Quantity: -- Refills: --       Sig: Take 180 mg by mouth daily.   ----Forwarding request to med asst that if approved send refill order to:  [facility] [address] [place], [place] - [place] [phone] (Phone) [phone] (Fax   --glh

## 2019-11-07 NOTE — Telephone Encounter (Signed)
Refill sent to pharmacy. AS, CMA 

## 2019-11-20 ENCOUNTER — Other Ambulatory Visit: Payer: Self-pay | Admitting: Cardiology

## 2019-12-01 ENCOUNTER — Other Ambulatory Visit: Payer: Self-pay | Admitting: Cardiology

## 2019-12-03 ENCOUNTER — Other Ambulatory Visit (HOSPITAL_COMMUNITY): Payer: PRIVATE HEALTH INSURANCE

## 2019-12-10 ENCOUNTER — Other Ambulatory Visit: Payer: Self-pay | Admitting: Cardiology

## 2020-01-04 ENCOUNTER — Other Ambulatory Visit: Payer: Self-pay | Admitting: Family Medicine

## 2020-02-08 ENCOUNTER — Other Ambulatory Visit: Payer: Self-pay | Admitting: Family Medicine

## 2020-02-12 ENCOUNTER — Other Ambulatory Visit (HOSPITAL_COMMUNITY): Payer: PRIVATE HEALTH INSURANCE

## 2020-02-15 ENCOUNTER — Telehealth: Payer: Self-pay

## 2020-02-15 MED ORDER — VALACYCLOVIR HCL 1 G PO TABS: ORAL_TABLET | ORAL | 0 refills | Status: DC

## 2020-02-15 NOTE — Telephone Encounter (Signed)
Please call pt to schedule appt.  No further refills until pt is seen.  T. Jaylynne Birkhead, CMA  

## 2020-02-23 ENCOUNTER — Other Ambulatory Visit: Payer: Self-pay | Admitting: Cardiology

## 2020-02-29 ENCOUNTER — Other Ambulatory Visit: Payer: Self-pay | Admitting: Cardiology

## 2020-03-04 ENCOUNTER — Telehealth: Payer: Self-pay | Admitting: Physician Assistant

## 2020-03-04 MED ORDER — VALACYCLOVIR HCL 1 G PO TABS: ORAL_TABLET | ORAL | 0 refills | Status: DC

## 2020-03-04 NOTE — Telephone Encounter (Signed)
Refills sent for #9 tablets.   Patient last seen 12/20 and advised to follow up in 4 months.   Please call patient and advise appointment needed for further refills. AS, CMA

## 2020-03-04 NOTE — Addendum Note (Signed)
Addended by: Mickel Crow on: 03/04/2020 10:04 AM   Modules accepted: Orders

## 2020-03-04 NOTE — Telephone Encounter (Signed)
Patient is requesting a refill of his Valtrex, he put in a request with pharm last week but the system routed to previous PCP (Dr. Jenetta Downer). Patient is heading out of town tomorrow and is hoping to be able to get it for possible allergies he has. If approved please send to [facility] on [address].

## 2020-03-05 ENCOUNTER — Other Ambulatory Visit: Payer: Self-pay | Admitting: Physician Assistant

## 2020-03-07 ENCOUNTER — Other Ambulatory Visit (HOSPITAL_COMMUNITY): Payer: PRIVATE HEALTH INSURANCE

## 2020-03-08 ENCOUNTER — Other Ambulatory Visit: Payer: Self-pay | Admitting: Cardiology

## 2020-03-10 ENCOUNTER — Telehealth: Payer: Self-pay | Admitting: Cardiology

## 2020-03-10 ENCOUNTER — Other Ambulatory Visit: Payer: Self-pay

## 2020-03-10 MED ORDER — METOPROLOL SUCCINATE ER 50 MG PO TB24
ORAL_TABLET | ORAL | 3 refills | Status: DC
Start: 2020-03-10 — End: 2021-03-11

## 2020-03-10 MED ORDER — METOPROLOL SUCCINATE ER 100 MG PO TB24: ORAL_TABLET | ORAL | 1 refills | Status: DC

## 2020-03-10 NOTE — Telephone Encounter (Signed)
New message   Transferred call to [facility] about pharmacy refill.

## 2020-03-10 NOTE — Addendum Note (Signed)
Addended by: Alvina Filbert B on: 03/10/2020 05:16 PM   Modules accepted: Orders

## 2020-03-10 NOTE — Telephone Encounter (Addendum)
Spoke with patients wife and she has been giving Metoprolol Suc 100 mg in the morning and 50 mg in the evening. She has been giving this dose to him for well over a year. Reviewed patients chart and see that the Metoprolol Suc 100 mg in the morning and 1/2 in the evening secondary to insurance not covering (see 12/15/18 mychart message)  Wife would like the Metoprolol Suc 50 mg take in the evening sent to [facility], sent as requested

## 2020-03-10 NOTE — Telephone Encounter (Signed)
Patient's wife calling back. °

## 2020-03-11 ENCOUNTER — Telehealth (HOSPITAL_COMMUNITY): Payer: Self-pay | Admitting: Cardiology

## 2020-03-11 NOTE — Telephone Encounter (Signed)
Patient has called and cancelled echocardiogram: 12/03/2019,02/12/2020 and 03/07/2020 and states he is too busy with work to have at this time.  Order will be removed from the [facility] and if patient calls back to schedule we can reinstate the order or create a new on.

## 2020-03-19 ENCOUNTER — Other Ambulatory Visit: Payer: Self-pay | Admitting: Cardiology

## 2020-03-30 IMAGING — CR DG CHEST 2V
2 series · 2 of 2 positions shown · non-contrast
Comparison: June 06, 2016

CLINICAL DATA: Shortness of breath starting last night

EXAM:
CHEST - 2 VIEW

[chest pa]
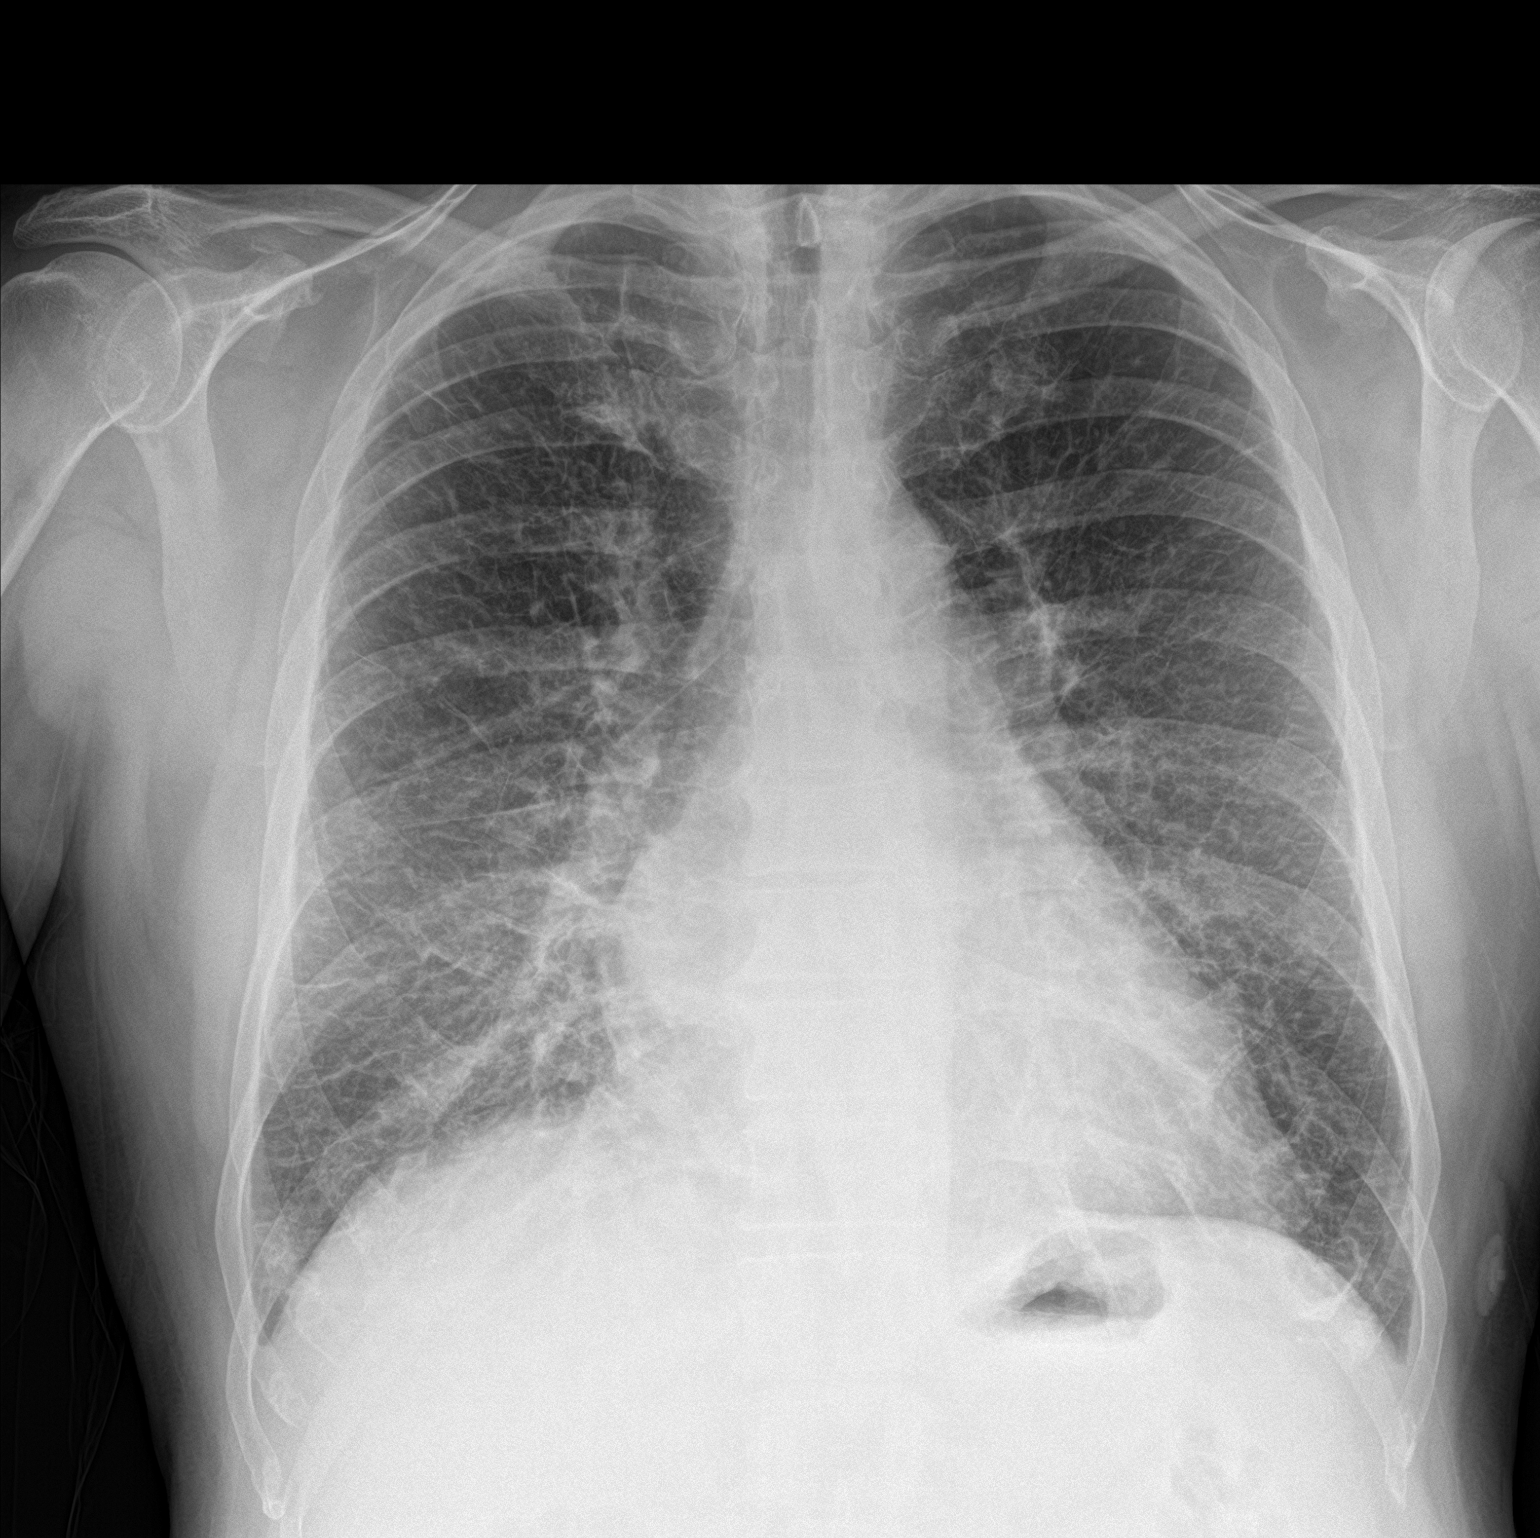

[chest lat]
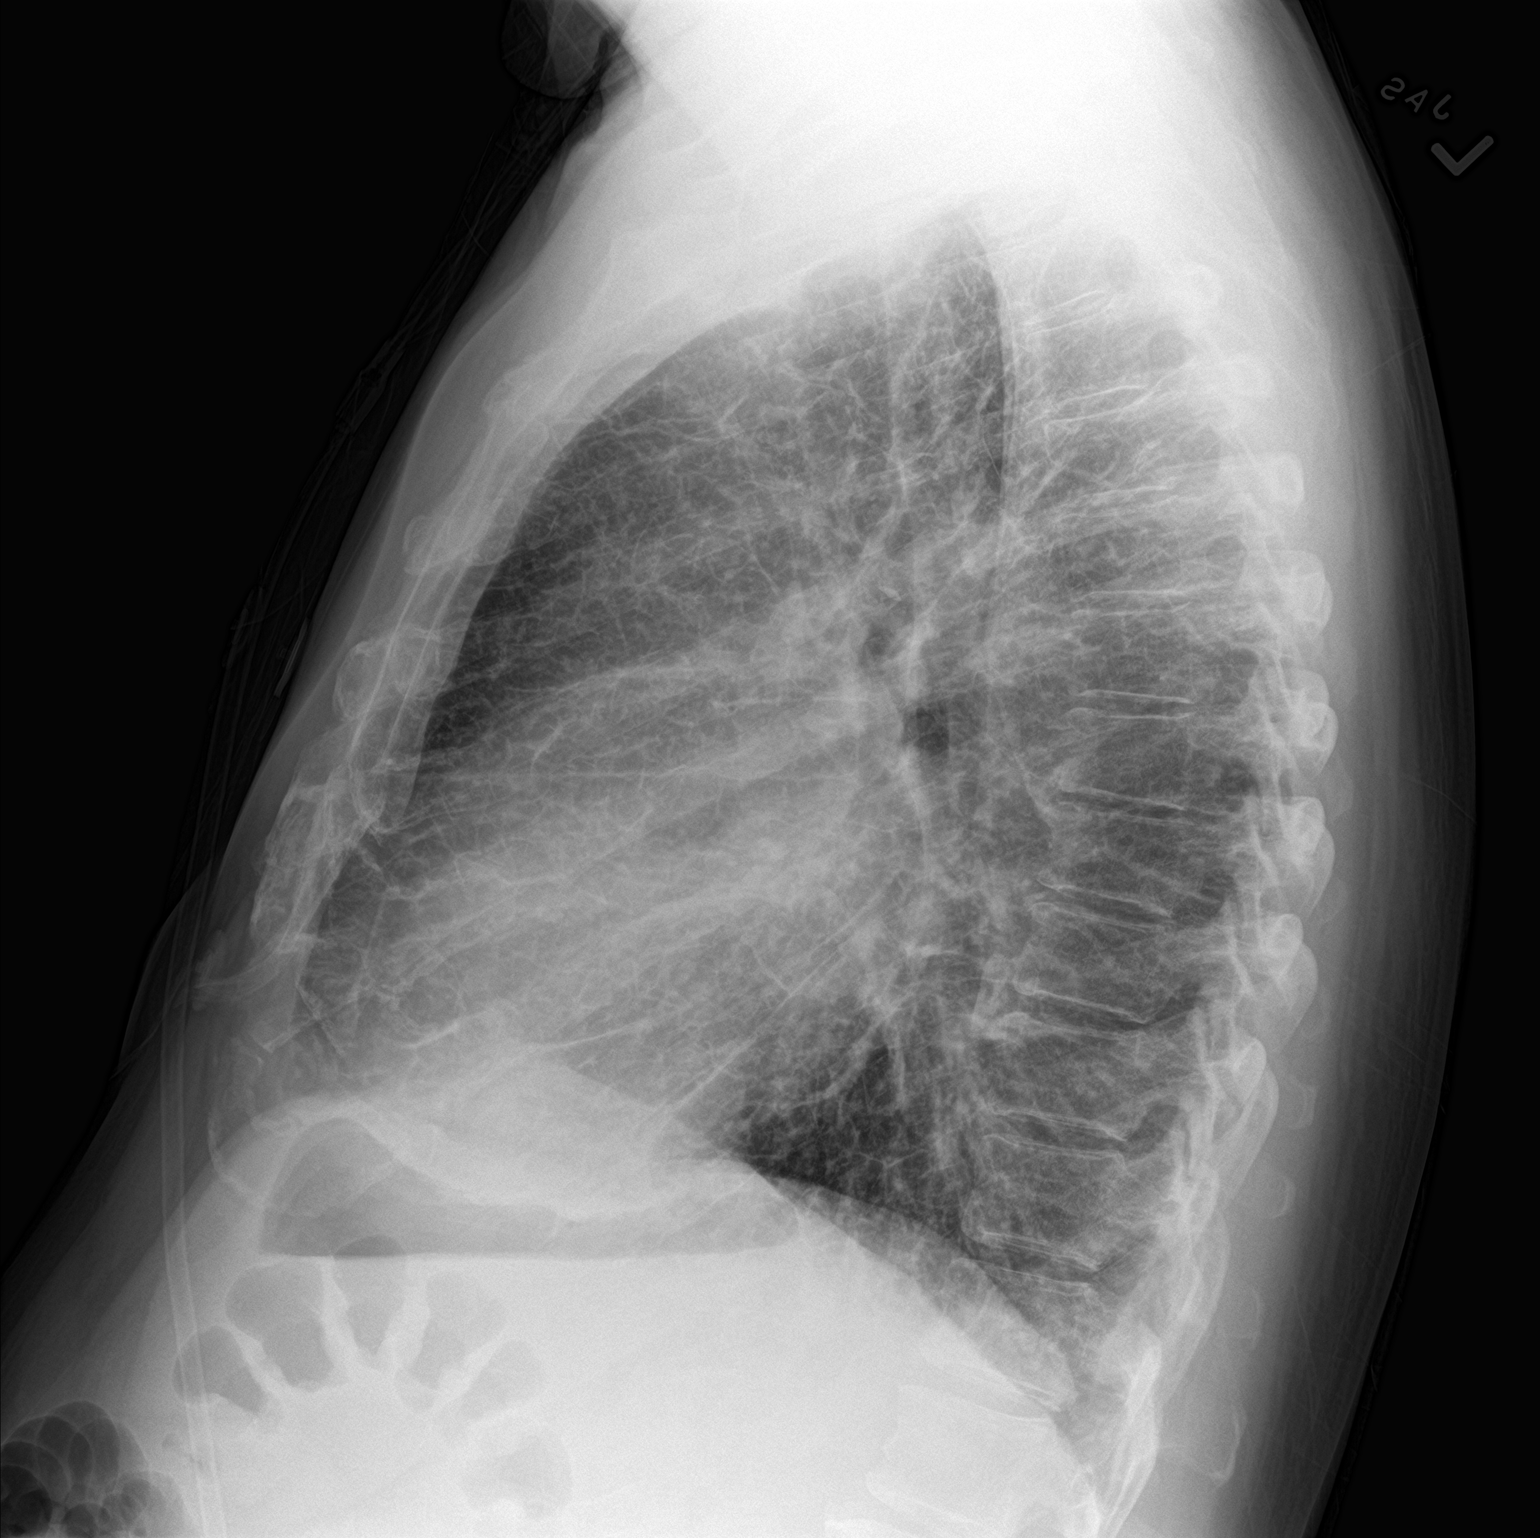

[2 of 2 positions shown; findings below may reference images not displayed]

FINDINGS: The mediastinal contour is normal. Heart size is mildly enlarged.
Increased pulmonary interstitium is identified bilaterally. There is
no focal pneumonia. Minimal left pleural effusion is noted. The
visualized skeletal structures are stable.
IMPRESSION: Mild cardiomegaly with mild interstitial edema.

## 2020-04-07 ENCOUNTER — Other Ambulatory Visit: Payer: Self-pay | Admitting: Cardiology

## 2020-04-08 ENCOUNTER — Telehealth: Payer: Self-pay | Admitting: Cardiology

## 2020-04-08 NOTE — Telephone Encounter (Signed)
LVM for patient to return call to get scheduled with [name] for follow up from recall list

## 2020-05-02 ENCOUNTER — Other Ambulatory Visit: Payer: Self-pay

## 2020-05-02 DIAGNOSIS — I4821 Permanent atrial fibrillation: Secondary | ICD-10-CM

## 2020-05-02 DIAGNOSIS — I4891 Unspecified atrial fibrillation: Secondary | ICD-10-CM

## 2020-05-02 DIAGNOSIS — I5181 Takotsubo syndrome: Secondary | ICD-10-CM

## 2020-05-02 DIAGNOSIS — I4892 Unspecified atrial flutter: Secondary | ICD-10-CM

## 2020-05-05 ENCOUNTER — Telehealth: Payer: Self-pay | Admitting: Physician Assistant

## 2020-05-05 ENCOUNTER — Other Ambulatory Visit: Payer: Self-pay | Admitting: Physician Assistant

## 2020-05-05 ENCOUNTER — Other Ambulatory Visit: Payer: Self-pay | Admitting: Cardiology

## 2020-05-05 NOTE — Telephone Encounter (Signed)
Please call patient to schedule apt for further refills. AS, CMA 

## 2020-05-12 ENCOUNTER — Other Ambulatory Visit: Payer: Self-pay | Admitting: Cardiology

## 2020-06-05 ENCOUNTER — Other Ambulatory Visit: Payer: Self-pay | Admitting: Cardiology

## 2020-06-06 NOTE — Telephone Encounter (Signed)
Prescription refill request for Eliquis received. Indication: Atrial Flutter Last office visit:10/2019  [name] Scr: 0.99  08/2019 Age: 64 Weight:89.8 kg  Prescription refilled

## 2020-07-04 ENCOUNTER — Other Ambulatory Visit: Payer: Self-pay | Admitting: Physician Assistant

## 2020-07-07 NOTE — Telephone Encounter (Signed)
Pt has not been seen since 08/2019 with instructions to return in 4 months.  Please review for appropriateness of refill request.  Charyl Bigger, CMA

## 2020-07-18 ENCOUNTER — Other Ambulatory Visit (HOSPITAL_COMMUNITY): Payer: Medicare HMO

## 2020-07-30 ENCOUNTER — Other Ambulatory Visit: Payer: Self-pay | Admitting: Physician Assistant

## 2020-07-30 ENCOUNTER — Other Ambulatory Visit: Payer: Self-pay | Admitting: Cardiology

## 2020-07-31 ENCOUNTER — Other Ambulatory Visit (HOSPITAL_COMMUNITY): Payer: Medicare HMO

## 2020-08-07 ENCOUNTER — Ambulatory Visit: Payer: Medicare HMO | Admitting: Cardiology

## 2020-09-24 ENCOUNTER — Other Ambulatory Visit (HOSPITAL_COMMUNITY): Payer: Medicare HMO

## 2020-09-30 ENCOUNTER — Other Ambulatory Visit: Payer: Self-pay | Admitting: Cardiology

## 2020-10-01 ENCOUNTER — Other Ambulatory Visit: Payer: Self-pay | Admitting: Physician Assistant

## 2020-10-06 ENCOUNTER — Other Ambulatory Visit: Payer: Self-pay | Admitting: Physician Assistant

## 2020-10-07 ENCOUNTER — Telehealth: Payer: Self-pay | Admitting: Physician Assistant

## 2020-10-07 NOTE — Telephone Encounter (Signed)
Left voicemail on 10-07-20.

## 2020-10-07 NOTE — Telephone Encounter (Signed)
Please call pt to schedule apt per last AVS for future med refills. AS, CMA

## 2020-10-08 ENCOUNTER — Other Ambulatory Visit (HOSPITAL_COMMUNITY): Payer: Medicare HMO

## 2020-10-09 NOTE — Telephone Encounter (Signed)
Opened in error

## 2020-10-12 NOTE — Progress Notes (Deleted)
Cardiology Office Note    Date:  10/12/2020   ID:  Dennis Lewis, DOB 06-03-1956, MRN [id number]  PCP:  Lorrene Reid, PA-C  Cardiologist:  Dr. Tavion Senkbeil [name]    History of Present Illness:  Dennis Lewis is a 65 y.o. male seen for follow up CHF. He has a PMH of hypertension, GERD, hyperlipidemia, chronic LBBB, history of NSVT, prostate cancer s/p radiation therapy, persistent atrial flutter, and NICM with EF 35-40%. He has a history of Etoh abuse.  He was admitted for NSTEMI on 06/06/2016, he underwent emergent cardiac catheterization which only showed 30% proximal RCA disease, 25-35% ejection fraction, pattern of apical wall motion abnormality.  Surprisingly, his troponin peaked at 31.36 which is a lot higher than expected for stress-induced cardiomyopathy. Retrospectively, it is possible that he had an embolic MI.  Imdur and hydralazine were added. Initial echo obtained in September showed ejection fraction 25-30%. He was also noted to have new atrial fibrillation, due to elevated CHA2DS2-Vasc score, he was started on eliquis. Repeat echocardiogram 6 weeks later on 07/20/2016 showed ejection fraction has improved to 66-29%, grade 3 diastolic dysfunction, paradoxical ventricular septal motion consistent with LBBB.   He was seen in May 2019  for preoperative clearance prior to right elbow and upper arm procedure. Repeat Echo was ordered but rescheduled multiple times and finally performed in September 2019. This demonstrated reduced LV function with EF 25-30% .  His persistent atrial fib/flutter  rate was controlled. He was started on Entresto and dose titrated by Pharm D. He was admitted overnight with CHF exacerbation on 09/16/18. Lasix had been recently reduced due to gout. Afib rate was elevated.  He was seen in January 2020 and spironolactone was increased. Since then we have worked on optimizing medical therapy  On follow up today he is doing well. He is tolerating medication well. He states he  is working too hard and hasn't found much time to exercise since October. He is abstinent from Etoh. Denies orthopnea, PND, palpitations, dizziness. No chest pain. Only SOB if he really pushes it.    Repeat Echo ordered but not done???    Past Medical History:  Diagnosis Date  . A-fib ([place])   . Anxiety   . Blood clot in vein 2017  . Cardiomyopathy due to hypertension, without heart failure ([place])   . Congestive dilated cardiomyopathy ([place]) 04/23/2016  . GERD (gastroesophageal reflux disease)   . H/O cardiovascular stress test 2010   [name]  . H/O echocardiogram 2010    mild LVH with some septal dyssynergy, EF 45%, impaired relaxation  . History of prostate cancer   . Hyperlipidemia   . Hypertension   . Left bundle branch block   . NSVT (nonsustained ventricular tachycardia) ([place])   . Palpitations   . Prostate cancer ([place]) 04/02/14   Gleason 7, volume 30 gm  . S/P radiation therapy 06/26/2014 through 08/22/2014                                                      Prostate 7800 cGy in 40 sessions                        . Torn tendon    right shoulder    Past Surgical History:  Procedure Laterality Date  .  BACK SURGERY  1993  . CARDIAC CATHETERIZATION  1990   Normal cors  . CARDIAC CATHETERIZATION N/A 06/06/2016   Procedure: Left Heart Cath and Coronary Angiography;  Surgeon: Jettie Booze, MD;  Location: [facility];  Service: Cardiovascular;  Laterality: N/A;  . PROSTATE BIOPSY  04/02/14   Gleason 7, vol 30 gm    Current Medications: Allergies as of 10/15/2020      Reactions   Chocolate Flavor Swelling   Ace Inhibitors Other (See Comments)   Cold sores   Chocolate Other (See Comments)   Blisters   Other Other (See Comments)   Seed on bread causes lip blisters Anesthesia also, but does nor remember which one   Peanut-containing Drug Products Other (See Comments)   Blisters   Tramadol    Hydralazine Palpitations   Irregular heartbeat, fluttering       Medication List       Accurate as of October 12, 2020  9:13 AM. If you have any questions, ask your nurse or doctor.        colchicine 0.6 MG tablet Take 1 tablet (0.6 mg total) by mouth daily.   Mitigare 0.6 MG Caps Generic drug: Colchicine TAKE ONE CAPSULE BY MOUTH TWO TIMES A DAY FOR ONE WEEK THEN TAKE ONE CAPSULE BY MOUTH DAILY   Eliquis 5 MG Tabs tablet Generic drug: apixaban Take 1 tablet by mouth twice daily   Entresto 97-103 MG Generic drug: sacubitril-valsartan Take 1 tablet by mouth twice daily   fexofenadine 180 MG tablet Commonly known as: ALLEGRA Take 1 tablet (180 mg total) by mouth daily.   furosemide 20 MG tablet Commonly known as: LASIX Take 2 tablets by mouth once daily   isosorbide mononitrate 30 MG 24 hr tablet Commonly known as: IMDUR TAKE 1 TABLET BY MOUTH ONCE DAILY. DO NOT CRUSH   metoprolol succinate 100 MG 24 hr tablet Commonly known as: TOPROL-XL TAKE 100MG  EVERY MORNING   metoprolol succinate 50 MG 24 hr tablet Commonly known as: TOPROL-XL Take 1 tablet by mouth in the evening Take with or immediately following a meal.   nitroGLYCERIN 0.4 MG SL tablet Commonly known as: NITROSTAT Place 1 tablet under the tongue every 5 (five) minutes as needed for chest pain. X 3 doses   potassium chloride 10 MEQ tablet Commonly known as: KLOR-CON Take 1 tablet (10 mEq total) by mouth daily.   potassium chloride 10 MEQ tablet Commonly known as: KLOR-CON Take 1 tablet by mouth twice daily   rosuvastatin 40 MG tablet Commonly known as: CRESTOR Take 1 tablet by mouth once daily   spironolactone 25 MG tablet Commonly known as: ALDACTONE TAKE 1/2 TABLET BY MOUTH ONCE DAILY IN THE MORNING   valACYclovir 1000 MG tablet Commonly known as: VALTREX TAKE 1 TABLET BY MOUTH THREE TIMES DAILY AS NEEDED . APPOINTMENT REQUIRED FOR FUTURE REFILLS       Allergies:   Chocolate flavor, Ace inhibitors, Chocolate, Other, Peanut-containing drug  products, Tramadol, and Hydralazine   Social History   Socioeconomic History  . Marital status: Single    Spouse name: Not on file  . Number of children: 2  . Years of education: Not on file  . Highest education level: Not on file  Occupational History  . Occupation: Nurse, adult  Tobacco Use  . Smoking status: Former Smoker    Types: Cigarettes    Quit date: 02/22/1979    Years since quitting: 41.6  . Smokeless tobacco: Never Used  Vaping Use  . Vaping Use: Never used  Substance and Sexual Activity  . Alcohol use: Yes    Alcohol/week: 28.0 standard drinks    Types: 28 Cans of beer per week    Comment: beer daily - 4 daily  . Drug use: No  . Sexual activity: Not Currently  Other Topics Concern  . Not on file  Social History Narrative  . Not on file   Social Determinants of Health   Financial Resource Strain: Not on file  Food Insecurity: Not on file  Transportation Needs: Not on file  Physical Activity: Not on file  Stress: Not on file  Social Connections: Not on file     Family History:  The patient's family history includes Esophageal cancer in his father; Prostate cancer in his brother; Throat cancer (age of onset: 81) in his father.   ROS:   Please see the history of present illness.    ROSd All other systems reviewed and are negative.   PHYSICAL EXAM:   VS:  There were no vitals taken for this visit.  I get a pulse rate of 120 bpm.  GENERAL:  Well appearing WM in NAD HEENT:  PERRL, EOMI, sclera are clear. Oropharynx is clear. NECK:  No jugular venous distention, carotid upstroke brisk and symmetric, no bruits, no thyromegaly or adenopathy LUNGS:  Clear to auscultation bilaterally CHEST:  Unremarkable HEART:  IRRR,  PMI not displaced or sustained,S1 and S2 within normal limits, no S3, no S4: no clicks, no rubs, no murmurs ABD:  Soft, nontender. BS +, no masses or bruits. No hepatomegaly, no splenomegaly EXT:  2 + pulses throughout, no edema, no cyanosis  no clubbing SKIN:  Warm and dry.  No rashes NEURO:  Alert and oriented x 3. Cranial nerves II through XII intact. PSYCH:  Cognitively intact    Wt Readings from Last 3 Encounters:  10/22/19 198 lb (89.8 kg)  09/19/19 195 lb 6.4 oz (88.6 kg)  10/10/18 193 lb 6.4 oz (87.7 kg)      Studies/Labs Reviewed:   EKG:  EKG is ordered today.  Atrial fib/flutter with rate 62. LBBB. I have personally reviewed and interpreted this study.    Recent Labs: No results found for requested labs within last 8760 hours.   Lipid Panel    Component Value Date/Time   CHOL 150 09/19/2019 0912   TRIG 174 (H) 09/19/2019 0912   HDL 57 09/19/2019 0912   CHOLHDL 2.6 09/19/2019 0912   LDLCALC 64 09/19/2019 0912    Additional studies/ records that were reviewed today include:  Labs dated 02/16/17: cholesterol 255, triglycerides 291, HDL 44, LDL 153. Chemistries, CBC, TSH normal.   Cath 06/06/2016 Conclusion     Prox RCA lesion, 30 %stenosed at a bend.  The left ventricular ejection fraction is 25-35% by visual estimate. The pattern of apical wall motion abnormality without severe CAD is suggestive of Takotsubo cardiomyopathy.  LV end diastolic pressure is moderately elevated.  There is no aortic valve stenosis.   Continue with aggressive medical therapy. He is allergic to ace inhibitors. Will add isosorbide to his hydralazine. Continue beta blocker. 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 He states that he was lying in bed for about 3 hours very worried about what he would do. He then started to have chest discomfort. The history is also consistent with Takatsubo cardiomyopathy.  May need diuresis given moderately elevated LVEDP.  Echo 07/20/2016 LV EF: 40% -   45%  Study Conclusions  - Left ventricle: The cavity size was normal. Systolic function was   mildly to moderately reduced. The estimated ejection fraction  was   in the range of 40% to 45%. Diffuse hypokinesis. There was a   reduced contribution of atrial contraction to ventricular   filling, due to increased ventricular diastolic pressure or   atrial contractile dysfunction. Doppler parameters are consistent   with a reversible restrictive pattern, indicative of decreased   left ventricular diastolic compliance and/or increased left   atrial pressure (grade 3 diastolic dysfunction). - Ventricular septum: Septal motion showed moderate paradox. These   changes are consistent with intraventricular conduction delay. - Left atrium: The atrium was mildly dilated. - Pulmonary arteries: PA peak pressure: 40 mm Hg (S).  Impressions:  - The right ventricular systolic pressure was increased consistent   with mild pulmonary hypertension.  Echo 05/31/18: Study Conclusions  - Left ventricle: Systolic function was severely reduced. The   estimated ejection fraction was in the range of 25% to 30%. - Mitral valve: There was mild regurgitation.  ASSESSMENT:    No diagnosis found.   PLAN:  In order of problems listed above:   1.  Chronic systolic CHF. NYHA functional class 2.  NICM:  Now on maximal medical therapy with Entresto, Toprol XL, spironolactone, and lasix.  HR is well controlled.  LV dysfunction is multifactorial related to prior Etoh abuse, dyssynergy with LBBB, prior embolic event, Afib with uncontrolled rate. Stressed importance of abstinence from [place] and sodium restriction.  Will update Echo.   If EF remains low may be a candidate for CRT/ICD.   2. Chronic atrial fib/flutter: on eliquis 5mg  BID. CHA2DS2-Vasc score 2 (HTN, HF).  Rate is now well controlled.   3. Hypertension: controlled  4. Hyperlipidemia: at goal on statin therapy.    5.   Gout. No recurrent symptoms. Will switch colchicine to prn.   Medication Adjustments/Labs and Tests Ordered: Current medicines are reviewed at length with the patient today.  Concerns  regarding medicines are outlined above.  Medication changes, Labs and Tests ordered today are listed in the Patient Instructions below. There are no Patient Instructions on file for this visit.  Follow up 6 months.   Signed, Noha Milberger [name], MD  10/12/2020 9:13 AM    [address], [place], [place]  [address] Phone: [phone]; Fax: [phone]

## 2020-10-15 ENCOUNTER — Other Ambulatory Visit (HOSPITAL_COMMUNITY): Payer: Medicare HMO

## 2020-10-15 ENCOUNTER — Ambulatory Visit: Payer: Medicare Other | Admitting: Cardiology

## 2020-11-06 ENCOUNTER — Ambulatory Visit: Payer: Medicare Other | Admitting: Physician Assistant

## 2020-11-06 ENCOUNTER — Other Ambulatory Visit: Payer: Self-pay | Admitting: Cardiology

## 2020-11-07 NOTE — Telephone Encounter (Signed)
Prescription refill request for Eliquis received. Indication: atrial fibrillation Last office visit: upcoming 3/22 [facility]:VFMBB labs Age: 65 Weight:89.8 kg  Prescription refilled

## 2020-11-10 ENCOUNTER — Ambulatory Visit: Payer: Medicare Other | Admitting: Physician Assistant

## 2020-12-10 ENCOUNTER — Other Ambulatory Visit: Payer: Self-pay

## 2020-12-10 ENCOUNTER — Encounter: Payer: Self-pay | Admitting: Physician Assistant

## 2020-12-10 ENCOUNTER — Ambulatory Visit (INDEPENDENT_AMBULATORY_CARE_PROVIDER_SITE_OTHER): Payer: Medicare Other | Admitting: Physician Assistant

## 2020-12-10 VITALS — BP 145/88 | HR 77 | Temp 98.6°F | Ht 71.0 in | Wt 196.8 lb

## 2020-12-10 DIAGNOSIS — B001 Herpesviral vesicular dermatitis: Secondary | ICD-10-CM

## 2020-12-10 DIAGNOSIS — H1013 Acute atopic conjunctivitis, bilateral: Secondary | ICD-10-CM

## 2020-12-10 DIAGNOSIS — J3089 Other allergic rhinitis: Secondary | ICD-10-CM | POA: Diagnosis not present

## 2020-12-10 DIAGNOSIS — J309 Allergic rhinitis, unspecified: Secondary | ICD-10-CM | POA: Diagnosis not present

## 2020-12-10 MED ORDER — FLUTICASONE PROPIONATE 50 MCG/ACT NA SUSP: 1.0000 | Freq: Every day | NASAL | 0 refills | Status: DC

## 2020-12-10 MED ORDER — VALACYCLOVIR HCL 1 G PO TABS: ORAL_TABLET | ORAL | 0 refills | Status: DC

## 2020-12-10 MED ORDER — OLOPATADINE HCL 0.2 % OP SOLN
OPHTHALMIC | 1 refills | Status: DC
Start: 2020-12-10 — End: 2021-02-04

## 2020-12-10 MED ORDER — LORATADINE 10 MG PO TABS: 10.0000 mg | ORAL_TABLET | Freq: Every day | ORAL | 1 refills | Status: DC

## 2020-12-10 NOTE — Patient Instructions (Addendum)
Cold Sore  A cold sore, also called a fever blister, is a small, fluid-filled sore that forms inside of the mouth or on the lips, gums, nose, chin, or cheeks. Cold sores can spread to other parts of the body, such as the eyes or fingers. Cold sores can spread from person to person (are contagious) until the sores crust over completely. Most cold sores go away within 2 weeks. What are the causes? Cold sores are caused by a virus (herpes simplex virus type 1, HSV-1). The virus can spread from person to person through close contact, such as through:  Kissing.  Touching the affected area.  Sharing personal items such as lip balm, razors, a drinking glass, or eating utensils. What increases the risk? You are more likely to develop this condition if you:  Are tired, stressed, or sick.  Are having your period (menstruating).  Are pregnant.  Take certain medicines.  Are out in cold weather or get too much sun. What are the signs or symptoms? Symptoms of a cold sore outbreak go through different stages. These are the stages of a cold sore:  Tingling, itching, or burning is felt 1-2 days before the outbreak.  Fluid-filled blisters appear on the lips, inside the mouth, on the nose, or on the cheeks.  The blisters start to ooze clear fluid.  The blisters dry up, and a yellow crust appears in their place.  The crust falls off. In some cases, other symptoms can develop during a cold sore outbreak. These can include:  Fever.  Sore throat.  Headache.  Muscle aches.  Swollen neck glands. How is this treated? There is no cure for cold sores or the virus that causes them. There is also no vaccine to prevent the virus. Most cold sores go away on their own without treatment within 2 weeks. Your doctor may prescribe medicines to:  Help with pain.  Keep the virus from growing.  Help you heal faster. Medicines may be in the form of creams, gels, pills, or a shot. Follow these  instructions at home: Medicines  Take or apply over-the-counter and prescription medicines only as told by your doctor.  Use a cotton-tip swab to apply creams or gels to your sores.  Ask your doctor if you can take lysine supplements. These may help with healing. Sore care  Do not touch the sores or pick the scabs.  Wash your hands often. Do not touch your eyes without washing your hands first.  Keep the sores clean and dry.  If told, put ice on the sores: ? Put ice in a plastic bag. ? Place a towel between your skin and the bag. ? Leave the ice on for 20 minutes, 2-3 times a day.   Eating and drinking  Eat a soft, bland diet. Avoid eating hot, cold, or salty foods. These can hurt your mouth.  Use a straw if it hurts to drink out of a glass.  Eat foods that have a lot of lysine in them. These include meat, fish, and dairy products.  Avoid sugary foods, chocolates, nuts, and grains. These foods have a high amount of a substance (arginine) that can cause the virus to grow. Lifestyle  Do not kiss, have oral sex, or share personal items until your sores heal.  Stress, poor sleep, and being out in the sun can trigger outbreaks. Make sure you: ? Do activities that help you relax, such as deep breathing exercises or meditation. ? Get enough sleep. ?  Apply sunscreen on your lips before you go out in the sun. Contact a doctor if:  You have symptoms for more than 2 weeks.  You have pus coming from the sores.  You have redness that is spreading.  You have pain or irritation in your eye.  You get sores on your genitals.  Your sores do not heal within 2 weeks.  You get cold sores often. Get help right away if:  You have a fever and your symptoms suddenly get worse.  You have a headache and confusion.  You have tiredness (fatigue).  You do not want to eat as much as normal (loss of appetite).  You have a stiff neck or are sensitive to light. Summary  A cold sore is  a small, fluid-filled sore that forms inside of the mouth or on the lips, gums, nose, chin, or cheeks.  Cold sores can spread from person to person (are contagious) until the sores crust over completely. Most cold sores go away within 2 weeks.  Wash your hands often. Do not touch your eyes without washing your hands first.  Do not kiss, have oral sex, or share personal items until your sores heal.  Contact a doctor if your sores do not heal within 2 weeks. This information is not intended to replace advice given to you by your health care provider. Make sure you discuss any questions you have with your health care provider. Document Revised: 12/27/2018 Document Reviewed: 02/06/2018 Elsevier Patient Education  2021 [employer].   Allergic Rhinitis, Adult  Allergic rhinitis is an allergic reaction that affects the mucous membrane inside the nose. The mucous membrane is the tissue that produces mucus. There are two types of allergic rhinitis:  Seasonal. This type is also called hay fever and happens only during certain seasons.  Perennial. This type can happen at any time of the year. Allergic rhinitis cannot be spread from person to person. This condition can be mild, moderate, or severe. It can develop at any age and may be outgrown. What are the causes? This condition is caused by allergens. These are things that can cause an allergic reaction. Allergens may differ for seasonal allergic rhinitis and perennial allergic rhinitis.  Seasonal allergic rhinitis is triggered by pollen. Pollen can come from grasses, trees, and weeds.  Perennial allergic rhinitis may be triggered by: ? Dust mites. ? Proteins in a pet's urine, saliva, or dander. Dander is dead skin cells from a pet. ? Smoke, mold, or car fumes. What increases the risk? You are more likely to develop this condition if you have a family history of allergies or other conditions related to allergies, including:  Allergic  conjunctivitis. This is inflammation of parts of the eyes and eyelids.  Asthma. This condition affects the lungs and makes it hard to breathe.  Atopic dermatitis or eczema. This is long term (chronic) inflammation of the skin.  Food allergies. What are the signs or symptoms? Symptoms of this condition include:  Sneezing or coughing.  A stuffy nose (nasal congestion), itchy nose, or nasal discharge.  Itchy eyes and tearing of the eyes.  A feeling of mucus dripping down the back of your throat (postnasal drip).  Trouble sleeping.  Tiredness or fatigue.  Headache.  Sore throat. How is this diagnosed? This condition may be diagnosed with your symptoms, medical history, and physical exam. Your health care provider may check for related conditions, such as:  Asthma.  Pink eye. This is eye inflammation caused by  infection (conjunctivitis).  Ear infection.  Upper respiratory infection. This is an infection in the nose, throat, or upper airways. You may also have tests to find out which allergens trigger your symptoms. These may include skin tests or blood tests. How is this treated? There is no cure for this condition, but treatment can help control symptoms. Treatment may include:  Taking medicines that block allergy symptoms, such as corticosteroids and antihistamines. Medicine may be given as a shot, nasal spray, or pill.  Avoiding any allergens.  Being exposed again and again to tiny amounts of allergens to help you build a defense against allergens (immunotherapy). This is done if other treatments have not helped. It may include: ? Allergy shots. These are injected medicines that have small amounts of allergen in them. ? Sublingual immunotherapy. This involves taking small doses of a medicine with allergen in it under your tongue. If these treatments do not work, your health care provider may prescribe newer, stronger medicines. Follow these instructions at home: Avoiding  allergens Find out what you are allergic to and avoid those allergens. These are some things you can do to help avoid allergens:  If you have perennial allergies: ? Replace carpet with wood, tile, or vinyl flooring. Carpet can trap dander and dust. ? Do not smoke. Do not allow smoking in your home. ? Change your heating and air conditioning filters at least once a month.  If you have seasonal allergies, take these steps during allergy season: ? Keep windows closed as much as possible. ? Plan outdoor activities when pollen counts are lowest. Check pollen counts before you plan outdoor activities. ? When coming indoors, change clothing and shower before sitting on furniture or bedding.  If you have a pet in the house that produces allergens: ? Keep the pet out of the bedroom. ? Vacuum, sweep, and dust regularly. General instructions  Take over-the-counter and prescription medicines only as told by your health care provider.  Drink enough fluid to keep your urine pale yellow.  Keep all follow-up visits as told by your health care provider. This is important. Where to find more information  American Academy of Allergy, Asthma & Immunology: www.aaaai.org Contact a health care provider if:  You have a fever.  You develop a cough that does not go away.  You make whistling sounds when you breathe (wheeze).  Your symptoms slow you down or stop you from doing your normal activities each day. Get help right away if:  You have shortness of breath. This symptom may represent a serious problem that is an emergency. Do not wait to see if the symptom will go away. Get medical help right away. Call your local emergency services (911 in the [place] Do not drive yourself to the hospital. Summary  Allergic rhinitis may be managed by taking medicines as directed and avoiding allergens.  If you have seasonal allergies, keep windows closed as much as possible during allergy season.  Contact your  health care provider if you develop a fever or a cough that does not go away. This information is not intended to replace advice given to you by your health care provider. Make sure you discuss any questions you have with your health care provider. Document Revised: 10/26/2019 Document Reviewed: 09/04/2019 Elsevier Patient Education  2021 [employer].

## 2020-12-10 NOTE — Progress Notes (Signed)
Acute Office Visit  Subjective:    Patient ID: Dennis Lewis, male    DOB: 03/31/1956, 65 y.o.   MRN: [id number]  Chief Complaint  Patient presents with  . Follow-up  . Medication Refill    Valtrex    HPI Patient is in today for c/o of runny nose, itchy and watery eyes year round. States symptoms start once he goes outside. Went to visit his son in [place] and did not have any symptoms, as soon as he returned to [place] symptoms resumed. 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 Denies sinus pressure, fever, cough or congestion. Takes Valtrex for recurrent cold sores. States has about 3 flare-ups per year. Has noticed certain wheat will trigger his flare up.   Past Medical History:  Diagnosis Date  . A-fib ([place])   . Anxiety   . Blood clot in vein 2017  . Cardiomyopathy due to hypertension, without heart failure ([place])   . Congestive dilated cardiomyopathy ([place]) 04/23/2016  . GERD (gastroesophageal reflux disease)   . H/O cardiovascular stress test 2010   [name]  . H/O echocardiogram 2010    mild LVH with some septal dyssynergy, EF 45%, impaired relaxation  . History of prostate cancer   . Hyperlipidemia   . Hypertension   . Left bundle branch block   . NSVT (nonsustained ventricular tachycardia) ([place])   . Palpitations   . Prostate cancer ([place]) 04/02/14   Gleason 7, volume 30 gm  . S/P radiation therapy 06/26/2014 through 08/22/2014                                                      Prostate 7800 cGy in 40 sessions                        . Torn tendon    right shoulder    Past Surgical History:  Procedure Laterality Date  . BACK SURGERY  1993  . CARDIAC CATHETERIZATION  1990   Normal cors  . CARDIAC CATHETERIZATION N/A 06/06/2016   Procedure: Left Heart Cath and Coronary Angiography;  Surgeon: Jettie Booze, MD;  Location: [facility];  Service: Cardiovascular;  Laterality: N/A;  . PROSTATE BIOPSY  04/02/14   Gleason 7, vol 30 gm    Family  History  Problem Relation Age of Onset  . Throat cancer Father 64       smoker  . Esophageal cancer Father   . Prostate cancer Brother        surgery  . Colon cancer Neg Hx   . Diabetes Neg Hx   . Colon polyps Neg Hx     Social History   Socioeconomic History  . Marital status: Married    Spouse name: Not on file  . Number of children: 2  . Years of education: Not on file  . Highest education level: Not on file  Occupational History  . Occupation: Nurse, adult  Tobacco Use  . Smoking status: Former Smoker    Types: Cigarettes    Quit date: 02/22/1979    Years since quitting: 41.8  . Smokeless tobacco: Never Used  Vaping Use  . Vaping Use: Never used  Substance and Sexual Activity  . Alcohol use: Yes    Alcohol/week: 28.0 standard drinks  Types: 28 Cans of beer per week    Comment: beer daily - 4 daily  . Drug use: No  . Sexual activity: Not Currently  Other Topics Concern  . Not on file  Social History Narrative  . Not on file   Social Determinants of Health   Financial Resource Strain: Not on file  Food Insecurity: Not on file  Transportation Needs: Not on file  Physical Activity: Not on file  Stress: Not on file  Social Connections: Not on file  Intimate Partner Violence: Not on file    Outpatient Medications Prior to Visit  Medication Sig Dispense Refill  . colchicine 0.6 MG tablet Take 1 tablet (0.6 mg total) by mouth daily. 90 tablet 3  . ELIQUIS 5 MG TABS tablet Take 1 tablet by mouth twice daily 90 tablet 0  . ENTRESTO 97-103 MG Take 1 tablet by mouth twice daily 180 tablet 0  . furosemide (LASIX) 20 MG tablet Take 2 tablets by mouth once daily 180 tablet 1  . isosorbide mononitrate (IMDUR) 30 MG 24 hr tablet TAKE 1 TABLET BY MOUTH ONCE DAILY. DO NOT CRUSH 90 tablet 1  . metoprolol succinate (TOPROL-XL) 100 MG 24 hr tablet TAKE 100MG  EVERY MORNING 180 tablet 1  . metoprolol succinate (TOPROL-XL) 50 MG 24 hr tablet Take 1 tablet by mouth in the  evening Take with or immediately following a meal. 90 tablet 3  . MITIGARE 0.6 MG CAPS TAKE ONE CAPSULE BY MOUTH TWO TIMES A DAY FOR ONE WEEK THEN TAKE ONE CAPSULE BY MOUTH DAILY 90 capsule 2  . nitroGLYCERIN (NITROSTAT) 0.4 MG SL tablet Place 1 tablet under the tongue every 5 (five) minutes as needed for chest pain. X 3 doses    . potassium chloride (KLOR-CON) 10 MEQ tablet Take 1 tablet by mouth twice daily 180 tablet 0  . rosuvastatin (CRESTOR) 40 MG tablet Take 1 tablet by mouth once daily 90 tablet 0  . spironolactone (ALDACTONE) 25 MG tablet TAKE 1/2 TABLET BY MOUTH ONCE DAILY IN THE MORNING 45 tablet 4  . fexofenadine (ALLEGRA) 180 MG tablet Take 1 tablet (180 mg total) by mouth daily. 90 tablet 1  . valACYclovir (VALTREX) 1000 MG tablet TAKE 1 TABLET BY MOUTH THREE TIMES DAILY AS NEEDED . APPOINTMENT REQUIRED FOR FUTURE REFILLS 9 tablet 0  . potassium chloride (K-DUR) 10 MEQ tablet Take 1 tablet (10 mEq total) by mouth daily. 90 tablet 3   No facility-administered medications prior to visit.    Allergies  Allergen Reactions  . Chocolate Flavor Swelling  . Ace Inhibitors Other (See Comments)    Cold sores   . Chocolate Other (See Comments)    Blisters   . Other Other (See Comments)    Seed on bread causes lip blisters Anesthesia also, but does nor remember which one  . Peanut-Containing Drug Products Other (See Comments)    Blisters  . Tramadol   . Hydralazine Palpitations    Irregular heartbeat, fluttering    Review of Systems A fourteen system review of systems was performed and found to be positive as per HPI.    Objective:    Physical Exam General:  Well Developed, well nourished, appropriate for stated age.  Neuro:  Alert and oriented,  extra-ocular muscles intact  HEENT:  Normocephalic, atraumatic, cobblestone of both lower eyelids noted, normal external canal's of both ears, mild boggy turbinates, no adenopathy, neck supple Skin:  no gross rash, warm,  pink. Cardiac:  RRR, S1  S2 wnl's  Respiratory:  ECTA B/L w/o wheezing, crackles, or rales, Not using accessory muscles, speaking in full sentences- unlabored. Vascular:  Ext warm, no cyanosis apprec.;  Psych:  No HI/SI, judgement and insight good, Euthymic mood. Full Affect.   BP (!) 145/88 (BP Location: Left Arm, Patient Position: Sitting)   Pulse 77   Temp 98.6 F (37 C)   Ht 5\' 11"  (1.803 m)   Wt 196 lb 12.8 oz (89.3 kg)   SpO2 99%   BMI 27.45 kg/m  Wt Readings from Last 3 Encounters:  12/10/20 196 lb 12.8 oz (89.3 kg)  10/22/19 198 lb (89.8 kg)  09/19/19 195 lb 6.4 oz (88.6 kg)    Health Maintenance Due  Topic Date Due  . TETANUS/TDAP  Never done  . PNA vac Low Risk Adult (1 of 2 - PCV13) Never done    There are no preventive care reminders to display for this patient.   Lab Results  Component Value Date   TSH 1.380 09/19/2019   Lab Results  Component Value Date   WBC 6.2 09/19/2019   HGB 16.0 09/19/2019   HCT 45.9 09/19/2019   MCV 97 09/19/2019   PLT 182 09/19/2019   Lab Results  Component Value Date   NA 141 09/19/2019   K 4.7 09/19/2019   CO2 24 09/19/2019   GLUCOSE 106 (H) 09/19/2019   BUN 20 09/19/2019   CREATININE 0.99 09/19/2019   BILITOT 0.4 09/19/2019   ALKPHOS 74 09/19/2019   AST 29 09/19/2019   ALT 36 09/19/2019   PROT 7.1 09/19/2019   ALBUMIN 4.4 09/19/2019   CALCIUM 9.9 09/19/2019   ANIONGAP 10 09/17/2018   Lab Results  Component Value Date   CHOL 150 09/19/2019   Lab Results  Component Value Date   HDL 57 09/19/2019   Lab Results  Component Value Date   LDLCALC 64 09/19/2019   Lab Results  Component Value Date   TRIG 174 (H) 09/19/2019   Lab Results  Component Value Date   CHOLHDL 2.6 09/19/2019   Lab Results  Component Value Date   HGBA1C 5.6 09/19/2019       Assessment & Plan:   Problem List Items Addressed This Visit   None   Visit Diagnoses    Allergic rhinitis, unspecified seasonality, unspecified  trigger    -  Primary   Relevant Medications   loratadine (CLARITIN) 10 MG tablet   fluticasone (FLONASE) 50 MCG/ACT nasal spray   Environmental and seasonal allergies       Relevant Medications   loratadine (CLARITIN) 10 MG tablet   fluticasone (FLONASE) 50 MCG/ACT nasal spray   Olopatadine HCl 0.2 % SOLN   Allergic conjunctivitis of both eyes       Relevant Medications   Olopatadine HCl 0.2 % SOLN   Recurrent cold sores       Relevant Medications   valACYclovir (VALTREX) 1000 MG tablet     Allergic conjunctivitis of both eyes, Allergic Rhinitis, Environmental and seasonal allergies: -Advised to take oral antihistamine daily (changed to loratadine and d/c fexofenadine), use Flonase nasal spray and antihistamine eye drops (olopatadine hcl).  -If symptoms fail to improve or worsen recommend referral to Asthma and Allergy.  Recurrent cold sores: -Stable. -Recommend to avoid potential triggers. -Continue current medication regimen. Provided refill.    Meds ordered this encounter  Medications  . loratadine (CLARITIN) 10 MG tablet    Sig: Take 1 tablet (10 mg total) by mouth daily.  Dispense:  90 tablet    Refill:  1    Order Specific Question:   Supervising Provider    Answer:   Beatrice Lecher D [[id number]]  . fluticasone (FLONASE) 50 MCG/ACT nasal spray    Sig: Place 1 spray into both nostrils daily.    Dispense:  16 g    Refill:  0    Order Specific Question:   Supervising Provider    Answer:   Beatrice Lecher D [[id number]]  . Olopatadine HCl 0.2 % SOLN    Sig: Instill 1 drop into each affected eye once daily.    Dispense:  2.5 mL    Refill:  1    Order Specific Question:   Supervising Provider    Answer:   Beatrice Lecher D [[id number]]  . valACYclovir (VALTREX) 1000 MG tablet    Sig: TAKE 1 TABLET BY MOUTH THREE TIMES DAILY AS NEEDED.    Dispense:  9 tablet    Refill:  0    Order Specific Question:   Supervising Provider    Answer:   Beatrice Lecher D [[id number]]      Lorrene Reid, PA-C

## 2020-12-11 ENCOUNTER — Other Ambulatory Visit: Payer: Self-pay | Admitting: Cardiology

## 2020-12-15 NOTE — Progress Notes (Signed)
Cardiology Office Note    Date:  12/18/2020   ID:  Dennis Lewis, DOB Dec 02, 1955, MRN [id number]  PCP:  Lorrene Reid, PA-C  Cardiologist:  Dr. Osamu Olguin [name]    History of Present Illness:  Dennis Lewis is a 65 y.o. male seen for follow up CHF. He has a PMH of hypertension, GERD, hyperlipidemia, chronic LBBB, history of NSVT, prostate cancer s/p radiation therapy, persistent atrial flutter, and NICM with EF 35-40%. He has a history of Etoh abuse.  He was admitted for NSTEMI on 06/06/2016, he underwent emergent cardiac catheterization which only showed 30% proximal RCA disease, 25-35% ejection fraction, pattern of apical wall motion abnormality.  Surprisingly, his troponin peaked at 31.36 which is a lot higher than expected for stress-induced cardiomyopathy. Retrospectively, it is possible that he had an embolic MI.  Imdur and hydralazine were added. Initial echo obtained in September showed ejection fraction 25-30%. He was also noted to have new atrial fibrillation, due to elevated CHA2DS2-Vasc score, he was started on eliquis. Repeat echocardiogram 6 weeks later on 07/20/2016 showed ejection fraction has improved to 24-58%, grade 3 diastolic dysfunction, paradoxical ventricular septal motion consistent with LBBB.   He was seen in May 2019  for preoperative clearance prior to right elbow and upper arm procedure. Repeat Echo was ordered but rescheduled multiple times and finally performed in September 2019. This demonstrated reduced LV function with EF 25-30% .  His persistent atrial fib/flutter  rate was controlled. He was started on Entresto and dose titrated by Pharm D. He was admitted overnight with CHF exacerbation on 09/16/18. Lasix had been recently reduced due to gout. Afib rate was elevated.  He was seen in January 2020 and spironolactone was increased. Since then we have worked on optimizing medical therapy  On follow up today he is doing well. He is tolerating medication well. He has been  spending a lot of time in [place] with his son who is a Estate agent.  He is abstinent from Etoh. Denies orthopnea, PND, palpitations. Has only noted dizziness twice with standing.  No chest pain. Only SOB if he really pushes it. He did have a prolonged bout of bronchitis a year ago (not Covid). He is exercising regularly.    Past Medical History:  Diagnosis Date  . A-fib ([place])   . Anxiety   . Blood clot in vein 2017  . Cardiomyopathy due to hypertension, without heart failure ([place])   . Congestive dilated cardiomyopathy ([place]) 04/23/2016  . GERD (gastroesophageal reflux disease)   . H/O cardiovascular stress test 2010   [name]  . H/O echocardiogram 2010    mild LVH with some septal dyssynergy, EF 45%, impaired relaxation  . History of prostate cancer   . Hyperlipidemia   . Hypertension   . Left bundle branch block   . NSVT (nonsustained ventricular tachycardia) ([place])   . Palpitations   . Prostate cancer ([place]) 04/02/14   Gleason 7, volume 30 gm  . S/P radiation therapy 06/26/2014 through 08/22/2014                                                      Prostate 7800 cGy in 40 sessions                        .  Torn tendon    right shoulder    Past Surgical History:  Procedure Laterality Date  . BACK SURGERY  1993  . CARDIAC CATHETERIZATION  1990   Normal cors  . CARDIAC CATHETERIZATION N/A 06/06/2016   Procedure: Left Heart Cath and Coronary Angiography;  Surgeon: Jettie Booze, MD;  Location: [facility];  Service: Cardiovascular;  Laterality: N/A;  . PROSTATE BIOPSY  04/02/14   Gleason 7, vol 30 gm    Current Medications: Allergies as of 12/18/2020      Reactions   Chocolate Flavor Swelling   Ace Inhibitors Other (See Comments)   Cold sores   Chocolate Other (See Comments)   Blisters   Other Other (See Comments)   Seed on bread causes lip blisters Anesthesia also, but does nor remember which one   Peanut-containing Drug Products Other (See Comments)    Blisters   Tramadol    Hydralazine Palpitations   Irregular heartbeat, fluttering      Medication List       Accurate as of December 18, 2020 10:02 AM. If you have any questions, ask your nurse or doctor.        colchicine 0.6 MG tablet Take 1 tablet (0.6 mg total) by mouth daily.   Mitigare 0.6 MG Caps Generic drug: Colchicine TAKE ONE CAPSULE BY MOUTH TWO TIMES A DAY FOR ONE WEEK THEN TAKE ONE CAPSULE BY MOUTH DAILY   Eliquis 5 MG Tabs tablet Generic drug: apixaban Take 1 tablet by mouth twice daily   Entresto 97-103 MG Generic drug: sacubitril-valsartan Take 1 tablet by mouth twice daily   fluticasone 50 MCG/ACT nasal spray Commonly known as: FLONASE Place 1 spray into both nostrils daily.   furosemide 20 MG tablet Commonly known as: LASIX Take 2 tablets by mouth once daily   isosorbide mononitrate 30 MG 24 hr tablet Commonly known as: IMDUR TAKE 1 TABLET BY MOUTH ONCE DAILY. DO NOT CRUSH   loratadine 10 MG tablet Commonly known as: CLARITIN Take 1 tablet (10 mg total) by mouth daily.   metoprolol succinate 100 MG 24 hr tablet Commonly known as: TOPROL-XL TAKE 100MG  EVERY MORNING   metoprolol succinate 50 MG 24 hr tablet Commonly known as: TOPROL-XL Take 1 tablet by mouth in the evening Take with or immediately following a meal.   nitroGLYCERIN 0.4 MG SL tablet Commonly known as: NITROSTAT Place 1 tablet under the tongue every 5 (five) minutes as needed for chest pain. X 3 doses   Olopatadine HCl 0.2 % Soln Instill 1 drop into each affected eye once daily.   potassium chloride 10 MEQ tablet Commonly known as: KLOR-CON Take 1 tablet (10 mEq total) by mouth daily.   potassium chloride 10 MEQ tablet Commonly known as: KLOR-CON Take 1 tablet by mouth twice daily   rosuvastatin 40 MG tablet Commonly known as: CRESTOR Take 1 tablet by mouth once daily   spironolactone 25 MG tablet Commonly known as: ALDACTONE TAKE 1/2 TABLET BY MOUTH ONCE DAILY IN  THE MORNING   valACYclovir 1000 MG tablet Commonly known as: VALTREX TAKE 1 TABLET BY MOUTH THREE TIMES DAILY AS NEEDED.       Allergies:   Chocolate flavor, Ace inhibitors, Chocolate, Other, Peanut-containing drug products, Tramadol, and Hydralazine   Social History   Socioeconomic History  . Marital status: Married    Spouse name: Not on file  . Number of children: 2  . Years of education: Not on file  . Highest education  level: Not on file  Occupational History  . Occupation: Nurse, adult  Tobacco Use  . Smoking status: Former Smoker    Types: Cigarettes    Quit date: 02/22/1979    Years since quitting: 41.8  . Smokeless tobacco: Never Used  Vaping Use  . Vaping Use: Never used  Substance and Sexual Activity  . Alcohol use: Yes    Alcohol/week: 28.0 standard drinks    Types: 28 Cans of beer per week    Comment: beer daily - 4 daily  . Drug use: No  . Sexual activity: Not Currently  Other Topics Concern  . Not on file  Social History Narrative  . Not on file   Social Determinants of Health   Financial Resource Strain: Not on file  Food Insecurity: Not on file  Transportation Needs: Not on file  Physical Activity: Not on file  Stress: Not on file  Social Connections: Not on file     Family History:  The patient's family history includes Esophageal cancer in his father; Prostate cancer in his brother; Throat cancer (age of onset: 55) in his father.   ROS:   Please see the history of present illness.    ROSd All other systems reviewed and are negative.   PHYSICAL EXAM:   VS:  BP 140/78 (BP Location: Right Arm, Patient Position: Sitting, Cuff Size: Normal)   Pulse 70   Ht 5\' 9"  (1.753 m)   Wt 194 lb 3.2 oz (88.1 kg)   SpO2 98%   BMI 28.68 kg/m   GENERAL:  Well appearing WM in NAD HEENT:  PERRL, EOMI, sclera are clear. Oropharynx is clear. NECK:  No jugular venous distention, carotid upstroke brisk and symmetric, no bruits, no thyromegaly or  adenopathy LUNGS:  Clear to auscultation bilaterally CHEST:  Unremarkable HEART:  IRRR,  PMI not displaced or sustained,S1 and S2 within normal limits, no S3, no S4: no clicks, no rubs, no murmurs ABD:  Soft, nontender. BS +, no masses or bruits. No hepatomegaly, no splenomegaly EXT:  2 + pulses throughout, no edema, no cyanosis no clubbing SKIN:  Warm and dry.  No rashes NEURO:  Alert and oriented x 3. Cranial nerves II through XII intact. PSYCH:  Cognitively intact    Wt Readings from Last 3 Encounters:  12/18/20 194 lb 3.2 oz (88.1 kg)  12/10/20 196 lb 12.8 oz (89.3 kg)  10/22/19 198 lb (89.8 kg)      Studies/Labs Reviewed:   EKG:  EKG is ordered today.  Atrial fib/flutter with rate 70. LBBB. I have personally reviewed and interpreted this study.    Recent Labs: No results found for requested labs within last 8760 hours.   Lipid Panel    Component Value Date/Time   CHOL 150 09/19/2019 0912   TRIG 174 (H) 09/19/2019 0912   HDL 57 09/19/2019 0912   CHOLHDL 2.6 09/19/2019 0912   LDLCALC 64 09/19/2019 0912    Additional studies/ records that were reviewed today include:  Labs dated 02/16/17: cholesterol 255, triglycerides 291, HDL 44, LDL 153. Chemistries, CBC, TSH normal.   Cath 06/06/2016 Conclusion     Prox RCA lesion, 30 %stenosed at a bend.  The left ventricular ejection fraction is 25-35% by visual estimate. The pattern of apical wall motion abnormality without severe CAD is suggestive of Takotsubo cardiomyopathy.  LV end diastolic pressure is moderately elevated.  There is no aortic valve stenosis.   Continue with aggressive medical therapy. He is allergic to ace  inhibitors. Will add isosorbide to his hydralazine. Continue beta blocker. 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 He states that he was lying in bed for about 3 hours very worried about what he would do. He then started to have  chest discomfort. The history is also consistent with Takatsubo cardiomyopathy.  May need diuresis given moderately elevated LVEDP.      Echo 07/20/2016 LV EF: 40% -   45%  Study Conclusions  - Left ventricle: The cavity size was normal. Systolic function was   mildly to moderately reduced. The estimated ejection fraction was   in the range of 40% to 45%. Diffuse hypokinesis. There was a   reduced contribution of atrial contraction to ventricular   filling, due to increased ventricular diastolic pressure or   atrial contractile dysfunction. Doppler parameters are consistent   with a reversible restrictive pattern, indicative of decreased   left ventricular diastolic compliance and/or increased left   atrial pressure (grade 3 diastolic dysfunction). - Ventricular septum: Septal motion showed moderate paradox. These   changes are consistent with intraventricular conduction delay. - Left atrium: The atrium was mildly dilated. - Pulmonary arteries: PA peak pressure: 40 mm Hg (S).  Impressions:  - The right ventricular systolic pressure was increased consistent   with mild pulmonary hypertension.  Echo 05/31/18: Study Conclusions  - Left ventricle: Systolic function was severely reduced. The   estimated ejection fraction was in the range of 25% to 30%. - Mitral valve: There was mild regurgitation.  ASSESSMENT:    1. Chronic systolic CHF (congestive heart failure) ([place])   2. Permanent atrial fibrillation ([place])   3. LBBB (left bundle branch block)   4. Essential hypertension      PLAN:  In order of problems listed above:   1.  Chronic systolic CHF. NYHA functional class 2.  NICM:  Now on good medical therapy with Entresto, Toprol XL, spironolactone, and lasix.  HR is well controlled. He did have an Echo today. Official report pending but on my review EF remains low <35%.  LV dysfunction is multifactorial related to prior Etoh abuse, dyssynergy with LBBB, prior embolic  event, Afib with uncontrolled rate. I have recommended increasing aldactone to 25 mg daily. Will add Jardiance 10 mg daily.  Recommend referral to EP  for CRT/ICD. Will update lab work today.  2. Chronic atrial fib/flutter: on eliquis 5mg  BID. CHA2DS2-Vasc score 2 (HTN, HF).  Rate is now well controlled.   3. Hypertension: controlled  4. Hyperlipidemia: at goal on statin therapy.  Check lab today.  5.   Gout. No recurrent symptoms.   Medication Adjustments/Labs and Tests Ordered: Current medicines are reviewed at length with the patient today.  Concerns regarding medicines are outlined above.  Medication changes, Labs and Tests ordered today are listed in the Patient Instructions below. Patient Instructions  Increase aldactone to 25 mg daily  Add Jardiance 10 mg daily  We will check on preauthorization  We will have you see one of my partners for consideration of implanting a pacemaker/ICD   Follow up 6 months.   Signed, Davarion Cuffee [name], MD  12/18/2020 10:02 AM    [facility] [place], [place], [place]  [address] Phone: [phone]; Fax: [phone]

## 2020-12-18 ENCOUNTER — Encounter: Payer: Self-pay | Admitting: Cardiology

## 2020-12-18 ENCOUNTER — Ambulatory Visit (HOSPITAL_COMMUNITY): Payer: Medicare Other | Attending: Cardiovascular Disease

## 2020-12-18 ENCOUNTER — Ambulatory Visit: Payer: Medicare Other | Admitting: Cardiology

## 2020-12-18 ENCOUNTER — Other Ambulatory Visit: Payer: Self-pay

## 2020-12-18 VITALS — BP 140/78 | HR 70 | Ht 69.0 in | Wt 194.2 lb

## 2020-12-18 DIAGNOSIS — I4821 Permanent atrial fibrillation: Secondary | ICD-10-CM | POA: Insufficient documentation

## 2020-12-18 DIAGNOSIS — I447 Left bundle-branch block, unspecified: Secondary | ICD-10-CM

## 2020-12-18 DIAGNOSIS — I5181 Takotsubo syndrome: Secondary | ICD-10-CM | POA: Insufficient documentation

## 2020-12-18 DIAGNOSIS — I5022 Chronic systolic (congestive) heart failure: Secondary | ICD-10-CM

## 2020-12-18 DIAGNOSIS — I10 Essential (primary) hypertension: Secondary | ICD-10-CM

## 2020-12-18 LAB — ECHOCARDIOGRAM COMPLETE
P 1/2 time: 606 msec
S' Lateral: 3.7 cm

## 2020-12-18 MED ORDER — EMPAGLIFLOZIN 10 MG PO TABS: 10.0000 mg | ORAL_TABLET | Freq: Every day | ORAL | 3 refills | Status: DC

## 2020-12-18 MED ORDER — SPIRONOLACTONE 25 MG PO TABS: 25.0000 mg | ORAL_TABLET | Freq: Every day | ORAL | 3 refills | Status: DC

## 2020-12-18 NOTE — Patient Instructions (Signed)
Increase aldactone to 25 mg daily  Add Jardiance 10 mg daily  We will check on preauthorization  We will have you see one of my partners for consideration of implanting a pacemaker/ICD

## 2020-12-18 NOTE — Addendum Note (Signed)
Addended by: Kathyrn Lass on: 12/18/2020 10:08 AM   Modules accepted: Orders

## 2020-12-19 ENCOUNTER — Other Ambulatory Visit: Payer: Self-pay

## 2020-12-19 DIAGNOSIS — I4891 Unspecified atrial fibrillation: Secondary | ICD-10-CM

## 2020-12-19 DIAGNOSIS — I5181 Takotsubo syndrome: Secondary | ICD-10-CM

## 2020-12-19 DIAGNOSIS — I4821 Permanent atrial fibrillation: Secondary | ICD-10-CM

## 2020-12-19 DIAGNOSIS — I5022 Chronic systolic (congestive) heart failure: Secondary | ICD-10-CM

## 2020-12-19 LAB — COMPREHENSIVE METABOLIC PANEL
ALT: 21 IU/L (ref 0–44)
AST: 18 IU/L (ref 0–40)
Albumin/Globulin Ratio: 1.6 (ref 1.2–2.2)
Albumin: 4.5 g/dL (ref 3.8–4.8)
Alkaline Phosphatase: 71 IU/L (ref 44–121)
BUN/Creatinine Ratio: 24 (ref 10–24)
BUN: 25 mg/dL (ref 8–27)
Bilirubin Total: 0.5 mg/dL (ref 0.0–1.2)
CO2: 21 mmol/L (ref 20–29)
Calcium: 9.7 mg/dL (ref 8.6–10.2)
Chloride: 106 mmol/L (ref 96–106)
Creatinine, Ser: 1.06 mg/dL (ref 0.76–1.27)
Globulin, Total: 2.9 g/dL (ref 1.5–4.5)
Glucose: 98 mg/dL (ref 65–99)
Potassium: 5.3 mmol/L — ABNORMAL HIGH (ref 3.5–5.2)
Sodium: 144 mmol/L (ref 134–144)
Total Protein: 7.4 g/dL (ref 6.0–8.5)
eGFR: 77 mL/min/{1.73_m2} (ref >59)

## 2020-12-19 LAB — CBC WITH DIFFERENTIAL/PLATELET
Basophils Absolute: 0.1 10*3/uL (ref 0.0–0.2)
Basos: 1 %
EOS (ABSOLUTE): 0.3 10*3/uL (ref 0.0–0.4)
Eos: 4 %
Hematocrit: 45.1 % (ref 37.5–51.0)
Hemoglobin: 15.2 g/dL (ref 13.0–17.7)
Immature Grans (Abs): 0.1 10*3/uL (ref 0.0–0.1)
Immature Granulocytes: 1 %
Lymphocytes Absolute: 1.6 10*3/uL (ref 0.7–3.1)
Lymphs: 25 %
MCH: 32.3 pg (ref 26.6–33.0)
MCHC: 33.7 g/dL (ref 31.5–35.7)
MCV: 96 fL (ref 79–97)
Monocytes Absolute: 0.8 10*3/uL (ref 0.1–0.9)
Monocytes: 13 %
Neutrophils Absolute: 3.5 10*3/uL (ref 1.4–7.0)
Neutrophils: 56 %
Platelets: 217 10*3/uL (ref 150–450)
RBC: 4.7 x10E6/uL (ref 4.14–5.80)
RDW: 13.1 % (ref 11.6–15.4)
WBC: 6.4 10*3/uL (ref 3.4–10.8)

## 2020-12-19 LAB — LIPID PANEL
Chol/HDL Ratio: 2.8 ratio (ref 0.0–5.0)
Cholesterol, Total: 147 mg/dL (ref 100–199)
HDL: 53 mg/dL (ref >39)
LDL Chol Calc (NIH): 66 mg/dL (ref 0–99)
Triglycerides: 165 mg/dL — ABNORMAL HIGH (ref 0–149)
VLDL Cholesterol Cal: 28 mg/dL (ref 5–40)

## 2020-12-19 NOTE — Progress Notes (Signed)
echo

## 2021-01-07 ENCOUNTER — Telehealth: Payer: Self-pay | Admitting: Internal Medicine

## 2021-01-07 ENCOUNTER — Telehealth: Payer: Self-pay | Admitting: Cardiology

## 2021-01-07 ENCOUNTER — Other Ambulatory Visit: Payer: Self-pay | Admitting: Cardiology

## 2021-01-07 NOTE — Telephone Encounter (Signed)
Please see telephone encounter

## 2021-01-07 NOTE — Telephone Encounter (Signed)
Spoke with patient who needs samples of Jardiance due to working with insurance coverage and patient leaving to go out of town today.   Advised patient that Samples of Jardiance 10mg  Daily were left at the front desk for pick up, quantity Crisp , Lot Number [id number] Exp: 9/23

## 2021-01-07 NOTE — Telephone Encounter (Signed)
Returned call to patient, advised patient that we do not have samples of Rosuvastatin.   Patient currently trying to get issues sorted with insurance plan.   Advised patient to contact pharmacy and request a 7 or 10 day supply to see if he can do that to get him through his insurance situation.   Patient agreeable to this and will call back with any issues.   Advised patient to call back to office with any issues, questions, or concerns. Patient verbalized understanding.

## 2021-01-07 NOTE — Telephone Encounter (Signed)
Patient calling the office for samples of medication:   1.  What medication and dosage are you requesting samples for? rosuvastatin (CRESTOR) 40 MG tablet  2.  Are you currently out of this medication? Yes, does not have any for today.    Patient states he is going out of town tomorrow morning and will be gone for a week. He would like to know if his wife can pick up some samples for him since she works across [employer] in the same building.

## 2021-01-07 NOTE — Telephone Encounter (Signed)
Jardiance prescribed by Dr.Jordan.

## 2021-01-07 NOTE — Telephone Encounter (Signed)
Patient calling the office for samples of medication:   1.  What medication and dosage are you requesting samples for? Jaurdince 2.  Are you currently out of this medication?  Yes  PT is calling in requesting additional samples of Jaurdince.PT is stating he is working with his insurance but he leaving to go out of town and only has 2 pills left. Please advise

## 2021-02-02 DIAGNOSIS — I4821 Permanent atrial fibrillation: Secondary | ICD-10-CM | POA: Insufficient documentation

## 2021-02-04 ENCOUNTER — Encounter: Payer: Self-pay | Admitting: Internal Medicine

## 2021-02-04 ENCOUNTER — Other Ambulatory Visit: Payer: Self-pay

## 2021-02-04 ENCOUNTER — Ambulatory Visit: Payer: Medicare Other | Admitting: Internal Medicine

## 2021-02-04 VITALS — BP 110/74 | HR 65 | Ht 69.0 in | Wt 187.0 lb

## 2021-02-04 DIAGNOSIS — I4821 Permanent atrial fibrillation: Secondary | ICD-10-CM

## 2021-02-04 DIAGNOSIS — I420 Dilated cardiomyopathy: Secondary | ICD-10-CM | POA: Diagnosis not present

## 2021-02-04 DIAGNOSIS — I447 Left bundle-branch block, unspecified: Secondary | ICD-10-CM | POA: Diagnosis not present

## 2021-02-04 NOTE — Patient Instructions (Signed)

## 2021-02-04 NOTE — Progress Notes (Signed)
ELECTROPHYSIOLOGY CONSULT NOTE  Patient ID: Dennis Lewis, MRN: [id number], DOB/AGE: Oct 13, 1955 65 y.o. Admit date: (Not on file) Date of Consult: 02/04/2021  Primary Physician: Lorrene Reid, PA-C Primary Cardiologist: KRYSTIAN YOUNGLOVE is a 65 y.o. male who is being seen today for the evaluation of ICD-CRT at the request of PJ.    HPI Dennis Lewis is a 65 y.o. male seen for consideration of CRT-D in the setting of nonischemic cardiomyopathy and IVCD  History of non-STEMI 9/17 catheterization demonstrated nonobstructive disease and diagnosis of stress-induced cardiomyopathy was postulated although the concern about an embolic MI was also entertained Persistent left ventricular dysfunction prompted the introduction of guideline directed therapy including Entresto spironolactone and his beta-blockers. Interval atrial fibrillation with anticoagulation with apixaban  Recent months, his functional status is continued to improve with augmented guideline directed therapy most recently with the introduction of an SGLT2.  Both he and his wife claim no functional limitations.  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  Climbing stairs without difficulty.  History of palpitations but these to have abated in the context of guideline directed therapy.  No syncope.  No edema or chest pain.  No nocturnal dyspnea orthopnea.  DATE TEST EF   9/17 LHC  30 % RCA 30%  10/17` Echo  40-45%   9/19 Echo  25-30 %    3/22 Echo 25-30%    Date Cr K Hgb  12/20 0.99 4.7 16.0  3/22 1.06 5.3 15.2      Past Medical History:  Diagnosis Date  . A-fib ([place])   . Anxiety   . Blood clot in vein 2017  . Cardiomyopathy due to hypertension, without heart failure ([place])   . 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 GERD (gastroesophageal reflux disease)   . H/O cardiovascular stress test 2010   [name]  . H/O echocardiogram 2010     mild LVH with some septal dyssynergy, EF 45%, impaired relaxation  . History of prostate cancer   . Hyperlipidemia   . Hypertension   . Left bundle branch block   . NSVT (nonsustained ventricular tachycardia) ([place])   . Palpitations   . Prostate cancer ([place]) 04/02/14   Gleason 7, volume 30 gm  . S/P radiation therapy 06/26/2014 through 08/22/2014                                                      Prostate 7800 cGy in 40 sessions                        . Torn tendon    right shoulder      Surgical History:  Past Surgical History:  Procedure Laterality Date  . BACK SURGERY  1993  . CARDIAC CATHETERIZATION  1990   Normal cors  . CARDIAC CATHETERIZATION N/A 06/06/2016   Procedure: Left Heart Cath and Coronary Angiography;  Surgeon: Jettie Booze, MD;  Location: [facility];  Service: Cardiovascular;  Laterality: N/A;  . PROSTATE BIOPSY  04/02/14   Gleason 7, vol 30 gm     Home Meds: Current Meds  Medication Sig  . colchicine 0.6 MG tablet Take 1 tablet (0.6 mg total)  by mouth daily.  Marland Kitchen ELIQUIS 5 MG TABS tablet Take 1 tablet by mouth twice daily  . empagliflozin (JARDIANCE) 10 MG TABS tablet Take 1 tablet (10 mg total) by mouth daily before breakfast.  . ENTRESTO 97-103 MG Take 1 tablet by mouth twice daily  . furosemide (LASIX) 20 MG tablet Take 2 tablets by mouth once daily  . isosorbide mononitrate (IMDUR) 30 MG 24 hr tablet TAKE 1 TABLET BY MOUTH ONCE DAILY. DO NOT CRUSH  . loratadine (CLARITIN) 10 MG tablet Take 1 tablet (10 mg total) by mouth daily.  . metoprolol succinate (TOPROL-XL) 100 MG 24 hr tablet TAKE 100MG  EVERY MORNING  . metoprolol succinate (TOPROL-XL) 50 MG 24 hr tablet Take 1 tablet by mouth in the evening Take with or immediately following a meal.  . MITIGARE 0.6 MG CAPS TAKE ONE CAPSULE BY MOUTH TWO TIMES A DAY FOR ONE WEEK THEN TAKE ONE CAPSULE BY MOUTH DAILY  . nitroGLYCERIN (NITROSTAT) 0.4 MG SL tablet Place 1 tablet under the tongue every 5  (five) minutes as needed for chest pain. X 3 doses  . rosuvastatin (CRESTOR) 40 MG tablet Take 1 tablet by mouth once daily  . spironolactone (ALDACTONE) 25 MG tablet Take 1 tablet (25 mg total) by mouth daily.  . valACYclovir (VALTREX) 1000 MG tablet TAKE 1 TABLET BY MOUTH THREE TIMES DAILY AS NEEDED.    Allergies:  Allergies  Allergen Reactions  . Chocolate Flavor Swelling  . Ace Inhibitors Other (See Comments)    Cold sores   . Chocolate Other (See Comments)    Blisters   . Other Other (See Comments)    Seed on bread causes lip blisters Anesthesia also, but does nor remember which one  . Peanut-Containing Drug Products Other (See Comments)    Blisters  . Tramadol   . Hydralazine Palpitations    Irregular heartbeat, fluttering    Social History   Socioeconomic History  . Marital status: Married    Spouse name: Not on file  . Number of children: 2  . Years of education: Not on file  . Highest education level: Not on file  Occupational History  . Occupation: Nurse, adult  Tobacco Use  . Smoking status: Former Smoker    Types: Cigarettes    Quit date: 02/22/1979    Years since quitting: 41.9  . Smokeless tobacco: Never Used  Vaping Use  . Vaping Use: Never used  Substance and Sexual Activity  . Alcohol use: Yes    Alcohol/week: 28.0 standard drinks    Types: 28 Cans of beer per week    Comment: beer daily - 4 daily  . Drug use: No  . Sexual activity: Not Currently  Other Topics Concern  . Not on file  Social History Narrative  . Not on file   Social Determinants of Health   Financial Resource Strain: Not on file  Food Insecurity: Not on file  Transportation Needs: Not on file  Physical Activity: Not on file  Stress: Not on file  Social Connections: Not on file  Intimate Partner Violence: Not on file     Family History  Problem Relation Age of Onset  . Throat cancer Father 51       smoker  . Esophageal cancer Father   . Prostate cancer Brother         surgery  . Colon cancer Neg Hx   . Diabetes Neg Hx   . Colon polyps Neg Hx  ROS:  Please see the history of present illness.     All other systems reviewed and negative.    Physical Exam:  Blood pressure 110/74, pulse 65, height 5\' 9"  (1.753 m), weight 187 lb (84.8 kg), SpO2 93 %. General: Well developed, well nourished male in no acute distress. Head: Normocephalic, atraumatic, sclera non-icteric, no xanthomas, nares are without discharge. EENT: normal  Lymph Nodes:  none Neck: Negative for carotid bruits. JVD not elevated. Back:without scoliosis kyphosis  Lungs: Clear bilaterally to auscultation without wheezes, rales, or rhonchi. Breathing is unlabored. Heart: RRR with S1 S2. No murmur . No rubs, or gallops appreciated. Abdomen: Soft, non-tender, non-distended with normoactive bowel sounds. No hepatomegaly. No rebound/guarding. No obvious abdominal masses. Msk:  Strength and tone appear normal for age. Extremities: No clubbing or cyanosis. No  edema.  Distal pedal pulses are 2+ and equal bilaterally. Skin: Warm and Dry Neuro: Alert and oriented X 3. CN III-XII intact Grossly normal sensory and motor function . Psych:  Responds to questions appropriately with a normal affect.      Labs: Cardiac Enzymes No results for input(s): CKTOTAL, CKMB, TROPONINI in the last 72 hours. CBC Lab Results  Component Value Date   WBC 6.4 12/18/2020   HGB 15.2 12/18/2020   HCT 45.1 12/18/2020   MCV 96 12/18/2020   PLT 217 12/18/2020   PROTIME: No results for input(s): LABPROT, INR in the last 72 hours. Chemistry No results for input(s): NA, K, CL, CO2, BUN, CREATININE, CALCIUM, PROT, BILITOT, ALKPHOS, ALT, AST, GLUCOSE in the last 168 hours.  Invalid input(s): LABALBU Lipids Lab Results  Component Value Date   CHOL 147 12/18/2020   HDL 53 12/18/2020   LDLCALC 66 12/18/2020   TRIG 165 (H) 12/18/2020   BNP No results found for: PROBNP Thyroid Function Tests: No results  for input(s): TSH, T4TOTAL, T3FREE, THYROIDAB in the last 72 hours.  Invalid input(s): FREET3 Miscellaneous No results found for: DDIMER  Radiology/Studies:  No results found.  EKG: Atrial fibrillation at 64 Intervals-/14/45 Fractionated upright in lead I, monophasic upright V6 and rSr'  in lead aVL  3/22 Q waves 1, L and monophasic R wave V6 QRSD 150 ms 2/21 atrial fibrillation R/R prime 1 and monophasic V6 QR in lead aVL QRST 148 12/19 atrial fibrillation with rapid rate Q waves 1, L and monophasic upright V6 QRSD 144 Assessment and Plan:  Nonischemic cardiomyopathy  Congestive heart failure class I  IVCD-left bundle branch block like  Hyperkalemia  Atrial fibrillation-permanent    The patient has a functional status on guideline directed therapy orchestrated by Dr. [name] and is now class I.  As such there is no indication for ICD given that he has a nonischemic cardiomyopathy.  Moreover, there is no indication for CRT.  This was a good thing; because the indication for CRT in him is very challenging for me.  He has left bundle branch block on his precordial leads but not on his limb leads.  Today for the first time on all of his heart tracings he has an upright QRS in leads I, L and V6, having mostly in the past been negative in 1 and L.  In either case we get 2 DODGE hat decision.  Permanent atrial fibrillation.  Agree with apixaban and will continue at 5 mg twice daily  Needs recheck of K   I will have him follow-up with Dr. Shirlee More.  We will see him again as needed.  Virl Axe

## 2021-02-11 ENCOUNTER — Other Ambulatory Visit: Payer: Self-pay | Admitting: Physician Assistant

## 2021-02-11 DIAGNOSIS — B001 Herpesviral vesicular dermatitis: Secondary | ICD-10-CM

## 2021-02-28 ENCOUNTER — Other Ambulatory Visit: Payer: Self-pay | Admitting: Cardiology

## 2021-03-01 ENCOUNTER — Other Ambulatory Visit: Payer: Self-pay | Admitting: Cardiology

## 2021-03-01 MED ORDER — ENTRESTO 97-103 MG PO TABS: 1.0000 | ORAL_TABLET | Freq: Two times a day (BID) | ORAL | 0 refills | Status: DC

## 2021-03-10 ENCOUNTER — Other Ambulatory Visit: Payer: Self-pay | Admitting: Cardiology

## 2021-03-11 ENCOUNTER — Telehealth: Payer: Self-pay

## 2021-03-11 MED ORDER — EMPAGLIFLOZIN 10 MG PO TABS: 10.0000 mg | ORAL_TABLET | Freq: Every day | ORAL | 3 refills | Status: DC

## 2021-03-11 NOTE — Telephone Encounter (Signed)
Patient's wife walked in office stating patient cannot afford Jardiance.Samples of Jardiance 10 mg given along with a patient assistance application.She will complete and bring back with proof of income.

## 2021-03-24 ENCOUNTER — Other Ambulatory Visit: Payer: Self-pay | Admitting: Cardiology

## 2021-03-25 DIAGNOSIS — L720 Epidermal cyst: Secondary | ICD-10-CM | POA: Diagnosis not present

## 2021-03-25 DIAGNOSIS — L820 Inflamed seborrheic keratosis: Secondary | ICD-10-CM | POA: Diagnosis not present

## 2021-04-28 ENCOUNTER — Other Ambulatory Visit: Payer: Self-pay | Admitting: Cardiology

## 2021-04-29 NOTE — Telephone Encounter (Signed)
Prescription refill request for Eliquis received. Indication:atrial fib Last office visit:5/22 Scr:1.0 Age: 65 Weight:84.8 kg  Prescription refilled

## 2021-05-21 ENCOUNTER — Telehealth: Payer: Self-pay

## 2021-05-21 NOTE — Telephone Encounter (Signed)
Patient sent a MyChart asking for Jardiance samples since he was not approved for the assistant program and will be switching insurance beginning of the new year.   Medication Samples have been provided to the patient.  Drug name: Jardiance     Strength: 10 mg    Qty: 28 tablets (4 boxes)   LOT: [id number]  Exp.Date: 01/2023  Dosing instructions: Take 1 tablet (10 mg) daily by mouth.   The patient was called and has been instructed regarding the correct time, dose, and frequency of taking this medication, including desired effects and most common side effects.   Jacqulynn Cadet 3:52 PM 05/21/2021

## 2021-05-25 ENCOUNTER — Other Ambulatory Visit: Payer: Self-pay | Admitting: Cardiology

## 2021-05-26 ENCOUNTER — Other Ambulatory Visit: Payer: Self-pay | Admitting: Cardiology

## 2021-05-27 ENCOUNTER — Other Ambulatory Visit (HOSPITAL_COMMUNITY): Payer: Medicare Other

## 2021-06-09 DIAGNOSIS — M542 Cervicalgia: Secondary | ICD-10-CM | POA: Insufficient documentation

## 2021-06-10 ENCOUNTER — Telehealth: Payer: Self-pay | Admitting: Physician Assistant

## 2021-06-10 NOTE — Telephone Encounter (Signed)
Left pt a VM to call the office and schedule AWV

## 2021-06-12 ENCOUNTER — Ambulatory Visit: Payer: Medicare Other

## 2021-06-15 ENCOUNTER — Other Ambulatory Visit: Payer: Self-pay | Admitting: Cardiology

## 2021-06-19 DIAGNOSIS — M542 Cervicalgia: Secondary | ICD-10-CM | POA: Diagnosis not present

## 2021-06-19 DIAGNOSIS — M519 Unspecified thoracic, thoracolumbar and lumbosacral intervertebral disc disorder: Secondary | ICD-10-CM | POA: Diagnosis not present

## 2021-07-22 ENCOUNTER — Other Ambulatory Visit (HOSPITAL_COMMUNITY): Payer: Medicare Other

## 2021-07-29 NOTE — Telephone Encounter (Signed)
Medication Samples have been provided to the patient, wife per request.   Drug name: Jardiance       Strength: 10mg         Qty: 28  LOT: [id number]  Exp.Date: 01/2023  Dosing instructions: Take 10mg  tablet by mouth once daily  The patient has been instructed regarding the correct time, dose, and frequency of taking this medication, including desired effects and most common side effects.   Newt Minion 4:57 PM 07/29/2021

## 2021-08-03 ENCOUNTER — Other Ambulatory Visit: Payer: Self-pay | Admitting: Cardiology

## 2021-08-04 NOTE — Telephone Encounter (Signed)
Prescription refill request for Eliquis received. Indication: afib  Last office visit: Caryl Comes, 02/04/2021 Scr: 1.06, 12/18/2020 Age: 65 yo  Weight: 84.8 kg   Refill sent.

## 2021-08-06 ENCOUNTER — Other Ambulatory Visit: Payer: Self-pay

## 2021-08-06 MED ORDER — COLCHICINE 0.6 MG PO CAPS: ORAL_CAPSULE | ORAL | 2 refills | Status: DC

## 2021-08-24 ENCOUNTER — Other Ambulatory Visit: Payer: Self-pay | Admitting: Cardiology

## 2021-08-24 DIAGNOSIS — N528 Other male erectile dysfunction: Secondary | ICD-10-CM | POA: Diagnosis not present

## 2021-08-25 ENCOUNTER — Encounter: Payer: Self-pay | Admitting: Cardiology

## 2021-08-25 MED ORDER — EMPAGLIFLOZIN 10 MG PO TABS: 10.0000 mg | ORAL_TABLET | Freq: Every day | ORAL | 0 refills | Status: DC

## 2021-09-04 ENCOUNTER — Other Ambulatory Visit: Payer: Self-pay | Admitting: Cardiology

## 2021-09-17 MED ORDER — EMPAGLIFLOZIN 10 MG PO TABS: 10.0000 mg | ORAL_TABLET | Freq: Every day | ORAL | 0 refills | Status: DC

## 2021-09-17 NOTE — Telephone Encounter (Signed)
Samples of Jardiance were given to the patient, quantity 1 bottle, Lot Number [id number] EXP: [id number]

## 2021-09-17 NOTE — Addendum Note (Signed)
Addended by: Waylan Rocher on: 09/17/2021 01:30 PM   Modules accepted: Orders

## 2021-09-23 ENCOUNTER — Encounter: Payer: Self-pay | Admitting: Cardiology

## 2021-09-30 ENCOUNTER — Other Ambulatory Visit: Payer: Self-pay | Admitting: Physician Assistant

## 2021-09-30 DIAGNOSIS — B001 Herpesviral vesicular dermatitis: Secondary | ICD-10-CM

## 2021-09-30 NOTE — Progress Notes (Deleted)
Cardiology Office Note    Date:  09/30/2021   ID:  Dennis Lewis, DOB Oct 11, 1955, MRN [id number]  PCP:  Lorrene Reid, PA-C  Cardiologist:  Dr. Ross Bender [name]    History of Present Illness:  Dennis Lewis is a 66 y.o. male seen for follow up CHF. He has a PMH of hypertension, GERD, hyperlipidemia, chronic LBBB, history of NSVT, prostate cancer s/p radiation therapy, persistent atrial flutter, and NICM with EF 35-40%. He has a history of Etoh abuse.  He was admitted for NSTEMI on 06/06/2016, he underwent emergent cardiac catheterization which only showed 30% proximal RCA disease, 25-35% ejection fraction, pattern of apical wall motion abnormality.  Surprisingly, his troponin peaked at 31.36 which is a lot higher than expected for stress-induced cardiomyopathy. Retrospectively, it is possible that he had an embolic MI.  Imdur and hydralazine were added. Initial echo obtained in September showed ejection fraction 25-30%. He was also noted to have new atrial fibrillation, due to elevated CHA2DS2-Vasc score, he was started on eliquis. Repeat echocardiogram 6 weeks later on 07/20/2016 showed ejection fraction has improved to 63-33%, grade 3 diastolic dysfunction, paradoxical ventricular septal motion consistent with LBBB.   He was seen in May 2019  for preoperative clearance prior to right elbow and upper arm procedure. Repeat Echo was ordered but rescheduled multiple times and finally performed in September 2019. This demonstrated reduced LV function with EF 25-30% .  His persistent atrial fib/flutter  rate was controlled. He was started on Entresto and dose titrated by Pharm D. He was admitted overnight with CHF exacerbation on 09/16/18. Lasix had been recently reduced due to gout. Afib rate was elevated.  He was seen in January 2020 and spironolactone was increased. Since then we have worked on optimizing medical therapy. When I last saw him we added Jardiance. He was seen by Dr Caryl Comes who felt ICD was not  indicated since he had a nonischemic CM. CRT was deferred since LBBB was not present when seen by Dr Caryl Comes and he was class 1.   On follow up today he is doing well. He is tolerating medication well. He has been spending a lot of time in [place] with his son who is a Estate agent.  He is abstinent from Etoh. Denies orthopnea, PND, palpitations. Has only noted dizziness twice with standing.  No chest pain. Only SOB if he really pushes it. He did have a prolonged bout of bronchitis a year ago (not Covid). He is exercising regularly.    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   GERD (gastroesophageal reflux disease)    H/O cardiovascular stress test 2010   [name]   H/O echocardiogram 2010    mild LVH with some septal dyssynergy, EF 45%, impaired relaxation   History of prostate cancer    Hyperlipidemia    Hypertension    Left bundle branch block    NSVT (nonsustained ventricular tachycardia) (HCC)    Palpitations    Prostate cancer ([place]) 04/02/14   Gleason 7, volume 30 gm   S/P radiation therapy 06/26/2014 through 08/22/2014  Prostate 7800 cGy in 40 sessions                         Torn tendon    right shoulder    Past Surgical History:  Procedure Laterality Date   [place]   Normal cors   CARDIAC CATHETERIZATION N/A 06/06/2016   Procedure: Left Heart Cath and Coronary Angiography;  Surgeon: Jettie Booze, MD;  Location: [facility];  Service: Cardiovascular;  Laterality: N/A;   PROSTATE BIOPSY  04/02/14   Gleason 7, vol 30 gm    Current Medications: Allergies as of 10/14/2021       Reactions   Chocolate Flavor Swelling   Ace Inhibitors Other (See Comments)   Cold sores   Chocolate Other (See  Comments)   Blisters   Other Other (See Comments)   Seed on bread causes lip blisters Anesthesia also, but does nor remember which one   Peanut-containing Drug Products Other (See Comments)   Blisters   Tramadol    Hydralazine Palpitations   Irregular heartbeat, fluttering        Medication List        Accurate as of September 30, 2021  2:43 PM. If you have any questions, ask your nurse or doctor.          colchicine 0.6 MG tablet Take 1 tablet (0.6 mg total) by mouth daily.   Colchicine 0.6 MG Caps Commonly known as: Mitigare TAKE ONE CAPSULE BY MOUTH TWO TIMES A DAY FOR ONE WEEK THEN TAKE ONE CAPSULE BY MOUTH DAILY   Eliquis 5 MG Tabs tablet Generic drug: apixaban Take 1 tablet by mouth twice daily   empagliflozin 10 MG Tabs tablet Commonly known as: Jardiance Take 1 tablet (10 mg total) by mouth daily before breakfast.   Entresto 97-103 MG Generic drug: sacubitril-valsartan Take 1 tablet by mouth twice daily   furosemide 20 MG tablet Commonly known as: LASIX TAKE 2 TABLETS BY MOUTH ONCE DAILY .   isosorbide mononitrate 30 MG 24 hr tablet Commonly known as: IMDUR TAKE 1 TABLET BY MOUTH ONCE DAILY DO  NOT  CRUSH   loratadine 10 MG tablet Commonly known as: CLARITIN Take 1 tablet (10 mg total) by mouth daily.   metoprolol succinate 50 MG 24 hr tablet Commonly known as: TOPROL-XL TAKE 1 TABLET BY MOUTH IN THE EVENING WITH FOOD OR  IMMEDIATELY  FOLLOWING  A  MEAL   metoprolol succinate 100 MG 24 hr tablet Commonly known as: TOPROL-XL TAKE 1 TABLET BY MOUTH IN THE MORNING   nitroGLYCERIN 0.4 MG SL tablet Commonly known as: NITROSTAT Place 1 tablet under the tongue every 5 (five) minutes as needed for chest pain. X 3 doses   rosuvastatin 40 MG tablet Commonly known as: CRESTOR Take 1 tablet by mouth once daily   spironolactone 25 MG tablet Commonly known as: ALDACTONE Take 1 tablet (25 mg total) by mouth daily.   valACYclovir 1000 MG  tablet Commonly known as: VALTREX Take 1 tablet by mouth three times daily as needed        Allergies:   Chocolate flavor, Ace inhibitors, Chocolate, Other, Peanut-containing drug products, Tramadol, and Hydralazine   Social History   Socioeconomic History   Marital status: Married    Spouse name: Not on file   Number of children: 2   Years of education: Not on file   Highest education level:  Not on file  Occupational History   Occupation: Nurse, adult  Tobacco Use   Smoking status: Former    Types: Cigarettes    Quit date: 02/22/1979    Years since quitting: 42.6   Smokeless tobacco: Never  Vaping Use   Vaping Use: Never used  Substance and Sexual Activity   Alcohol use: Yes    Alcohol/week: 28.0 standard drinks    Types: 28 Cans of beer per week    Comment: beer daily - 4 daily   Drug use: No   Sexual activity: Not Currently  Other Topics Concern   Not on file  Social History Narrative   Not on file   Social Determinants of Health   Financial Resource Strain: Not on file  Food Insecurity: Not on file  Transportation Needs: Not on file  Physical Activity: Not on file  Stress: Not on file  Social Connections: Not on file     Family History:  The patient's family history includes Esophageal cancer in his father; Prostate cancer in his brother; Throat cancer (age of onset: 15) in his father.   ROS:   Please see the history of present illness.    ROSd All other systems reviewed and are negative.   PHYSICAL EXAM:   VS:  There were no vitals taken for this visit.  GENERAL:  Well appearing WM in NAD HEENT:  PERRL, EOMI, sclera are clear. Oropharynx is clear. NECK:  No jugular venous distention, carotid upstroke brisk and symmetric, no bruits, no thyromegaly or adenopathy LUNGS:  Clear to auscultation bilaterally CHEST:  Unremarkable HEART:  IRRR,  PMI not displaced or sustained,S1 and S2 within normal limits, no S3, no S4: no clicks, no rubs, no  murmurs ABD:  Soft, nontender. BS +, no masses or bruits. No hepatomegaly, no splenomegaly EXT:  2 + pulses throughout, no edema, no cyanosis no clubbing SKIN:  Warm and dry.  No rashes NEURO:  Alert and oriented x 3. Cranial nerves II through XII intact. PSYCH:  Cognitively intact    Wt Readings from Last 3 Encounters:  02/04/21 187 lb (84.8 kg)  12/18/20 194 lb 3.2 oz (88.1 kg)  12/10/20 196 lb 12.8 oz (89.3 kg)      Studies/Labs Reviewed:   EKG:  EKG is ordered today.  Atrial fib/flutter with rate 70. LBBB. I have personally reviewed and interpreted this study.    Recent Labs: 12/18/2020: ALT 21; BUN 25; Creatinine, Ser 1.06; Hemoglobin 15.2; Platelets 217; Potassium 5.3; Sodium 144   Lipid Panel    Component Value Date/Time   CHOL 147 12/18/2020 1219   TRIG 165 (H) 12/18/2020 1219   HDL 53 12/18/2020 1219   CHOLHDL 2.8 12/18/2020 1219   LDLCALC 66 12/18/2020 1219    Additional studies/ records that were reviewed today include:  Labs dated 02/16/17: cholesterol 255, triglycerides 291, HDL 44, LDL 153. Chemistries, CBC, TSH normal.   Cath 06/06/2016 Conclusion     Prox RCA lesion, 30 %stenosed at a bend. The left ventricular ejection fraction is 25-35% by visual estimate. The pattern of apical wall motion abnormality without severe CAD is suggestive of Takotsubo cardiomyopathy. LV end diastolic pressure is moderately elevated. There is no aortic valve stenosis.   Continue with aggressive medical therapy. He is allergic to ace inhibitors. Will add isosorbide to his hydralazine. Continue beta blocker. 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 He states that he was lying in bed  for about 3 hours very worried about what he would do. He then started to have chest discomfort. The history is also consistent with Takatsubo cardiomyopathy.  May need diuresis given moderately elevated LVEDP.      Echo  07/20/2016 LV EF: 40% -   45%   Study Conclusions   - Left ventricle: The cavity size was normal. Systolic function was   mildly to moderately reduced. The estimated ejection fraction was   in the range of 40% to 45%. Diffuse hypokinesis. There was a   reduced contribution of atrial contraction to ventricular   filling, due to increased ventricular diastolic pressure or   atrial contractile dysfunction. Doppler parameters are consistent   with a reversible restrictive pattern, indicative of decreased   left ventricular diastolic compliance and/or increased left   atrial pressure (grade 3 diastolic dysfunction). - Ventricular septum: Septal motion showed moderate paradox. These   changes are consistent with intraventricular conduction delay. - Left atrium: The atrium was mildly dilated. - Pulmonary arteries: PA peak pressure: 40 mm Hg (S).   Impressions:   - The right ventricular systolic pressure was increased consistent   with mild pulmonary hypertension.   Echo 05/31/18: Study Conclusions   - Left ventricle: Systolic function was severely reduced. The   estimated ejection fraction was in the range of 25% to 30%. - Mitral valve: There was mild regurgitation.  Echo 12/18/20: IMPRESSIONS     1. Left ventricular ejection fraction, by estimation, is 25 to 30%. The  left ventricle has severely decreased function. The left ventricle  demonstrates global hypokinesis. Left ventricular diastolic function could  not be evaluated.   2. Right ventricular systolic function is mildly reduced. The right  ventricular size is mildly enlarged. Tricuspid regurgitation signal is  inadequate for assessing PA pressure.   3. Left atrial size was severely dilated.   4. Right atrial size was mildly dilated.   5. The mitral valve is grossly normal. Mild mitral valve regurgitation.  No evidence of mitral stenosis.   6. The aortic valve is tricuspid. There is mild calcification of the  aortic valve.  Aortic valve regurgitation is mild. No aortic stenosis is  present.   7. The inferior vena cava is normal in size with greater than 50%  respiratory variability, suggesting right atrial pressure of 3 mmHg.   Comparison(s): No significant change from prior study. Hypokinesis appears  global on this study. EF ~30%.   ASSESSMENT:    No diagnosis found.    PLAN:  In order of problems listed above:    Chronic systolic CHF. NYHA functional class 1.  NICM:  Now on good medical therapy with Entresto, Toprol XL, spironolactone, and lasix.  HR is well controlled. He did have an Echo today. Official report pending but on my review EF remains low <35%.  LV dysfunction is multifactorial related to prior Etoh abuse, dyssynergy with LBBB, prior embolic event, Afib with uncontrolled rate. I have recommended increasing aldactone to 25 mg daily. Will add Jardiance 10 mg daily.  He was seen by Dr Jens Som and CRT/ICD deferred.   Chronic atrial fib/flutter: on eliquis 5mg  BID. CHA2DS2-Vasc score 2 (HTN, HF).  Rate is now well controlled.   Hypertension: controlled  Hyperlipidemia: at goal on statin therapy.  Check lab today.  5.   Gout. No recurrent symptoms.   Medication Adjustments/Labs and Tests Ordered: Current medicines are reviewed at length with the patient today.  Concerns regarding medicines are outlined above.  Medication  changes, Labs and Tests ordered today are listed in the Patient Instructions below. There are no Patient Instructions on file for this visit.   Follow up 6 months.   Signed, Lateefa Crosby [name], MD  09/30/2021 2:43 PM    [facility] [address], [place], [place]  [address] Phone: [phone]; Fax: [phone]

## 2021-10-06 ENCOUNTER — Other Ambulatory Visit: Payer: Self-pay | Admitting: Cardiology

## 2021-10-09 ENCOUNTER — Other Ambulatory Visit: Payer: Self-pay | Admitting: *Deleted

## 2021-10-09 DIAGNOSIS — I420 Dilated cardiomyopathy: Secondary | ICD-10-CM

## 2021-10-13 ENCOUNTER — Ambulatory Visit (HOSPITAL_COMMUNITY): Payer: Medicare (Managed Care)

## 2021-10-14 ENCOUNTER — Ambulatory Visit: Payer: Medicare Other | Admitting: Cardiology

## 2021-11-02 DIAGNOSIS — H20042 Secondary noninfectious iridocyclitis, left eye: Secondary | ICD-10-CM | POA: Diagnosis not present

## 2021-11-02 DIAGNOSIS — S0512XA Contusion of eyeball and orbital tissues, left eye, initial encounter: Secondary | ICD-10-CM | POA: Diagnosis not present

## 2021-11-03 DIAGNOSIS — H20042 Secondary noninfectious iridocyclitis, left eye: Secondary | ICD-10-CM | POA: Diagnosis not present

## 2021-11-03 DIAGNOSIS — S0512XA Contusion of eyeball and orbital tissues, left eye, initial encounter: Secondary | ICD-10-CM | POA: Diagnosis not present

## 2021-12-17 DIAGNOSIS — H2513 Age-related nuclear cataract, bilateral: Secondary | ICD-10-CM | POA: Diagnosis not present

## 2021-12-19 ENCOUNTER — Other Ambulatory Visit: Payer: Self-pay | Admitting: Cardiology

## 2021-12-22 ENCOUNTER — Other Ambulatory Visit: Payer: Self-pay | Admitting: Cardiology

## 2022-01-07 ENCOUNTER — Other Ambulatory Visit: Payer: Self-pay | Admitting: Cardiology

## 2022-01-11 ENCOUNTER — Ambulatory Visit: Payer: Self-pay | Admitting: Cardiology

## 2022-01-11 ENCOUNTER — Ambulatory Visit (HOSPITAL_COMMUNITY): Payer: Medicare (Managed Care)

## 2022-01-12 ENCOUNTER — Encounter: Payer: Self-pay | Admitting: Cardiology

## 2022-01-12 MED ORDER — ROSUVASTATIN CALCIUM 40 MG PO TABS: 40.0000 mg | ORAL_TABLET | Freq: Every day | ORAL | 0 refills | Status: DC

## 2022-01-13 ENCOUNTER — Other Ambulatory Visit: Payer: Self-pay | Admitting: Cardiology

## 2022-01-16 ENCOUNTER — Other Ambulatory Visit: Payer: Self-pay | Admitting: Cardiology

## 2022-01-19 ENCOUNTER — Telehealth: Payer: Self-pay | Admitting: Physician Assistant

## 2022-01-21 ENCOUNTER — Other Ambulatory Visit: Payer: Self-pay | Admitting: Cardiology

## 2022-01-21 ENCOUNTER — Telehealth: Payer: Self-pay | Admitting: Cardiology

## 2022-01-21 DIAGNOSIS — I4891 Unspecified atrial fibrillation: Secondary | ICD-10-CM

## 2022-01-21 NOTE — Telephone Encounter (Signed)
Prescription refill request for Eliquis received. ?Indication:afib ?Last office visit:klein 02/04/21 ?Scr:1.06 12/18/20 ?Age: 32m?WUGAYGE:72.0TK? ?

## 2022-01-21 NOTE — Telephone Encounter (Signed)
Patient calling the office for samples of medication: ? ? ?1.  What medication and dosage are you requesting samples for? empagliflozin (JARDIANCE) 10 MG TABS tablet ? ?2.  Are you currently out of this medication? Yes, and is going out of town tomorrow ?  ?

## 2022-01-21 NOTE — Telephone Encounter (Signed)
Spoke with pt, aware samples placed at the front desk for pick up including paperwork for assistance. ?

## 2022-01-27 ENCOUNTER — Encounter: Payer: Self-pay | Admitting: Cardiology

## 2022-02-18 ENCOUNTER — Encounter: Payer: Self-pay | Admitting: Cardiology

## 2022-02-22 NOTE — Progress Notes (Signed)
Cardiology Office Note    Date:  02/25/2022   ID:  Dennis Lewis, DOB 06/10/1956, MRN [id number]  PCP:  Lorrene Reid, PA-C  Cardiologist:  Dr. Kori Goins [name]    History of Present Illness:  Dennis Lewis is a 66 y.o. male seen for follow up CHF. He has a PMH of hypertension, GERD, hyperlipidemia, chronic LBBB, history of NSVT, prostate cancer s/p radiation therapy, persistent atrial flutter, and NICM with EF 35-40%. He has a history of Etoh abuse.  He was admitted for NSTEMI on 06/06/2016, he underwent emergent cardiac catheterization which only showed 30% proximal RCA disease, 25-35% ejection fraction, pattern of apical wall motion abnormality.  Surprisingly, his troponin peaked at 31.36 which is a lot higher than expected for stress-induced cardiomyopathy. Retrospectively, it is possible that he had an embolic MI.  Imdur and hydralazine were added. Initial echo obtained in September showed ejection fraction 25-30%. He was also noted to have new atrial fibrillation, due to elevated CHA2DS2-Vasc score, he was started on eliquis. Repeat echocardiogram 6 weeks later on 07/20/2016 showed ejection fraction has improved to 40-98%, grade 3 diastolic dysfunction, paradoxical ventricular septal motion consistent with LBBB.   He was seen in May 2019  for preoperative clearance prior to right elbow and upper arm procedure. Repeat Echo was ordered but rescheduled multiple times and finally performed in September 2019. This demonstrated reduced LV function with EF 25-30% .  His persistent atrial fib/flutter  rate was controlled. He was started on Entresto and dose titrated by Pharm D. He was admitted overnight with CHF exacerbation on 09/16/18. Lasix had been recently reduced due to gout. Afib rate was elevated.  He was seen in January 2020 and spironolactone was increased. Since then we have worked on optimizing medical therapy  On follow up today he is doing well. He is tolerating medication well. 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 Walks 3 miles a day at a brisk pace. He denies any palpitations, dyspnea or edema. Energy level is good. He has been spending a lot of time in [place] with his son who is a Estate agent.  He is abstinent from Etoh. Denies orthopnea, PND, palpitations. He did have follow up Echo today showing improvement in EF to 40-45%.     Past Medical History:  Diagnosis Date   A-fib ([place])    Anxiety    Blood clot in vein 2017   Cardiomyopathy due to hypertension, without heart failure (HCC)    Congestive dilated cardiomyopathy ([place]) 04/23/2016   GERD (gastroesophageal reflux disease)    H/O cardiovascular stress test 2010   [name]   H/O echocardiogram 2010    mild LVH with some septal dyssynergy, EF 45%, impaired relaxation   History of prostate cancer    Hyperlipidemia    Hypertension    Left bundle branch block    NSVT (nonsustained ventricular tachycardia) (HCC)    Palpitations    Prostate cancer ([place]) 04/02/14   Gleason 7, volume 30 gm   S/P radiation therapy 06/26/2014 through 08/22/2014                                                      Prostate 7800 cGy in 40 sessions  Torn tendon    right shoulder    Past Surgical History:  Procedure Laterality Date   [place]   Normal cors   CARDIAC CATHETERIZATION N/A 06/06/2016   Procedure: Left Heart Cath and Coronary Angiography;  Surgeon: Jettie Booze, MD;  Location: [facility];  Service: Cardiovascular;  Laterality: N/A;   PROSTATE BIOPSY  04/02/14   Gleason 7, vol 30 gm    Current Medications: Allergies as of 02/25/2022       Reactions   Chocolate Flavor Swelling   Ace Inhibitors Other (See Comments)   Cold sores   Chocolate Other (See Comments)   Blisters   Other Other (See Comments)   Seed on bread causes lip blisters Anesthesia also, but does nor remember which one   Peanut-containing Drug Products Other  (See Comments)   Blisters   Tramadol    Hydralazine Palpitations   Irregular heartbeat, fluttering        Medication List        Accurate as of February 25, 2022  4:26 PM. If you have any questions, ask your nurse or doctor.          apixaban 5 MG Tabs tablet Commonly known as: Eliquis Take 1 tablet (5 mg total) by mouth 2 (two) times daily. What changed: how much to take Changed by: Jamara Vary [name], MD   colchicine 0.6 MG tablet Take 1 tablet (0.6 mg total) by mouth daily.   Colchicine 0.6 MG Caps Commonly known as: Mitigare TAKE ONE CAPSULE BY MOUTH TWO TIMES A DAY FOR ONE WEEK THEN TAKE ONE CAPSULE BY MOUTH DAILY   empagliflozin 10 MG Tabs tablet Commonly known as: Jardiance Take 1 tablet (10 mg total) by mouth daily before breakfast. What changed: additional instructions Changed by: Sumaya Riedesel [name], MD   Entresto 97-103 MG Generic drug: sacubitril-valsartan Take 1 tablet by mouth twice daily   fluticasone 50 MCG/ACT nasal spray Commonly known as: FLONASE fluticasone propionate 50 mcg/actuation nasal spray,suspension  USE 1 SPRAY(S) IN EACH NOSTRIL ONCE DAILY   furosemide 20 MG tablet Commonly known as: LASIX TAKE 2 TABLETS BY MOUTH ONCE DAILY .   isosorbide mononitrate 30 MG 24 hr tablet Commonly known as: IMDUR TAKE 1 TABLET BY MOUTH ONCE DAILY DO  NOT  CRUSH   loratadine 10 MG tablet Commonly known as: CLARITIN Take 1 tablet (10 mg total) by mouth daily.   methocarbamol 500 MG tablet Commonly known as: ROBAXIN methocarbamol 500 mg tablet  TAKE 1 TABLET BY MOUTH THREE TIMES DAILY AS NEEDED FOR 10 DAYS   metoprolol succinate 50 MG 24 hr tablet Commonly known as: TOPROL-XL TAKE 1 TABLET BY MOUTH IN THE EVENING WITH FOOD OR  IMMEDIATELY  FOLLOWING  A  MEAL   metoprolol succinate 100 MG 24 hr tablet Commonly known as: TOPROL-XL TAKE 1 TABLET BY MOUTH IN THE MORNING   nitroGLYCERIN 0.4 MG SL tablet Commonly known as: NITROSTAT Place 1 tablet under the  tongue every 5 (five) minutes as needed for chest pain. X 3 doses   rosuvastatin 40 MG tablet Commonly known as: CRESTOR Take 1 tablet (40 mg total) by mouth daily.   spironolactone 25 MG tablet Commonly known as: ALDACTONE Take 1 tablet (25 mg total) by mouth daily.   valACYclovir 1000 MG tablet Commonly known as: VALTREX Take 1 tablet by mouth three times daily as needed        Allergies:  Chocolate flavor, Ace inhibitors, Chocolate, Other, Peanut-containing drug products, Tramadol, and Hydralazine   Social History   Socioeconomic History   Marital status: Married    Spouse name: Not on file   Number of children: 2   Years of education: Not on file   Highest education level: Not on file  Occupational History   Occupation: Nurse, adult  Tobacco Use   Smoking status: Former    Types: Cigarettes    Quit date: 02/22/1979    Years since quitting: 43.0   Smokeless tobacco: Never  Vaping Use   Vaping Use: Never used  Substance and Sexual Activity   Alcohol use: Yes    Alcohol/week: 28.0 standard drinks of alcohol    Types: 28 Cans of beer per week    Comment: beer daily - 4 daily   Drug use: No   Sexual activity: Not Currently  Other Topics Concern   Not on file  Social History Narrative   Not on file   Social Determinants of Health   Financial Resource Strain: Not on file  Food Insecurity: Not on file  Transportation Needs: Not on file  Physical Activity: Not on file  Stress: Not on file  Social Connections: Not on file     Family History:  The patient's family history includes Esophageal cancer in his father; Prostate cancer in his brother; Throat cancer (age of onset: 46) in his father.   ROS:   Please see the history of present illness.    ROSd All other systems reviewed and are negative.   PHYSICAL EXAM:   VS:  BP 100/62   Pulse 79   Ht '5\' 11"'$  (1.803 m)   Wt 195 lb (88.5 kg)   SpO2 99%   BMI 27.20 kg/m   GENERAL:  Well appearing WM in  NAD HEENT:  PERRL, EOMI, sclera are clear. Oropharynx is clear. NECK:  No jugular venous distention, carotid upstroke brisk and symmetric, no bruits, no thyromegaly or adenopathy LUNGS:  Clear to auscultation bilaterally CHEST:  Unremarkable HEART:  IRRR,  PMI not displaced or sustained,S1 and S2 within normal limits, no S3, no S4: no clicks, no rubs, no murmurs ABD:  Soft, nontender. BS +, no masses or bruits. No hepatomegaly, no splenomegaly EXT:  2 + pulses throughout, no edema, no cyanosis no clubbing SKIN:  Warm and dry.  No rashes NEURO:  Alert and oriented x 3. Cranial nerves II through XII intact. PSYCH:  Cognitively intact    Wt Readings from Last 3 Encounters:  02/25/22 195 lb (88.5 kg)  02/04/21 187 lb (84.8 kg)  12/18/20 194 lb 3.2 oz (88.1 kg)      Studies/Labs Reviewed:   EKG:  EKG is ordered today.  Atrial fib/ with rate 79. LBBB. I have personally reviewed and interpreted this study.    Recent Labs: No results found for requested labs within last 365 days.   Lipid Panel    Component Value Date/Time   CHOL 147 12/18/2020 1219   TRIG 165 (H) 12/18/2020 1219   HDL 53 12/18/2020 1219   CHOLHDL 2.8 12/18/2020 1219   LDLCALC 66 12/18/2020 1219    Additional studies/ records that were reviewed today include:  Labs dated 02/16/17: cholesterol 255, triglycerides 291, HDL 44, LDL 153. Chemistries, CBC, TSH normal.   Cath 06/06/2016 Conclusion     Prox RCA lesion, 30 %stenosed at a bend. The left ventricular ejection fraction is 25-35% by visual estimate. The pattern of apical wall motion  abnormality without severe CAD is suggestive of Takotsubo cardiomyopathy. LV end diastolic pressure is moderately elevated. There is no aortic valve stenosis.   Continue with aggressive medical therapy. He is allergic to ace inhibitors. Will add isosorbide to his hydralazine. Continue beta blocker. Upon further questioning, the patient did have very stressful news early this  morning regarding his business. He states that he was lying in bed for about 3 hours very worried about what he would do. He then started to have chest discomfort. The history is also consistent with Takatsubo cardiomyopathy.  May need diuresis given moderately elevated LVEDP.      Echo 07/20/2016 LV EF: 40% -   45%   Study Conclusions   - Left ventricle: The cavity size was normal. Systolic function was   mildly to moderately reduced. The estimated ejection fraction was   in the range of 40% to 45%. Diffuse hypokinesis. There was a   reduced contribution of atrial contraction to ventricular   filling, due to increased ventricular diastolic pressure or   atrial contractile dysfunction. Doppler parameters are consistent   with a reversible restrictive pattern, indicative of decreased   left ventricular diastolic compliance and/or increased left   atrial pressure (grade 3 diastolic dysfunction). - Ventricular septum: Septal motion showed moderate paradox. These   changes are consistent with intraventricular conduction delay. - Left atrium: The atrium was mildly dilated. - Pulmonary arteries: PA peak pressure: 40 mm Hg (S).   Impressions:   - The right ventricular systolic pressure was increased consistent   with mild pulmonary hypertension.   Echo 05/31/18: Study Conclusions   - Left ventricle: Systolic function was severely reduced. The   estimated ejection fraction was in the range of 25% to 30%. - Mitral valve: There was mild regurgitation.   Echo 02/25/22: IMPRESSIONS     1. Left ventricular ejection fraction, by estimation, is 40 to 45%. The  left ventricle has mildly decreased function. The left ventricle  demonstrates global hypokinesis. There is mild left ventricular  hypertrophy. Left ventricular diastolic parameters  are consistent with Grade I diastolic dysfunction (impaired relaxation).   2. Right ventricular systolic function is normal. The right ventricular  size  is mildly enlarged.   3. The mitral valve is normal in structure. No evidence of mitral valve  regurgitation. No evidence of mitral stenosis.   4. The aortic valve is tricuspid. There is mild calcification of the  aortic valve. There is mild thickening of the aortic valve. Aortic valve  regurgitation is mild. Aortic valve sclerosis is present, with no evidence  of aortic valve stenosis.   5. The inferior vena cava is normal in size with greater than 50%  respiratory variability, suggesting right atrial pressure of 3 mmHg.   Comparison(s): The left ventricular function has improved. Prior EF  25-30%.   ASSESSMENT:    1. Chronic systolic CHF (congestive heart failure) ([place])   2. Left bundle branch block   3. Permanent atrial fibrillation ([place])   4. Congestive dilated cardiomyopathy (HCC)      PLAN:  In order of problems listed above:    Chronic systolic CHF. NYHA functional class 1.  NICM:  Now on good medical therapy with Entresto, Toprol XL, spironolactone, Jardiance, and lasix.  HR is well controlled. He did have an Echo today. EF has improved from 25-30% to 40-45%/  LV dysfunction is multifactorial related to prior Etoh abuse, dyssynergy with LBBB, prior embolic event, Afib with uncontrolled rate. He is on  optimal medical therapy now. Will follow up labs today. See him back in 6 months.  Chronic atrial fib/flutter: on eliquis '5mg'$  BID. CHA2DS2-Vasc score 2 (HTN, HF).  Rate is now well controlled.   Hypertension: controlled  Hyperlipidemia: at goal on statin therapy.  Check lab today.  5.   Gout. No recurrent symptoms.   Medication Adjustments/Labs and Tests Ordered: Current medicines are reviewed at length with the patient today.  Concerns regarding medicines are outlined above.  Medication changes, Labs and Tests ordered today are listed in the Patient Instructions below. Patient Instructions  Medication Instructions:  Your physician recommends that you continue on your  current medications as directed. Please refer to the Current Medication list given to you today.  *If you need a refill on your cardiac medications before your next appointment, please call your pharmacy*   Lab Work: CMET, CBC, Lipid today  If you have labs (blood work) drawn today and your tests are completely normal, you will receive your results only by: [place] (if you have MyChart) OR A paper copy in the mail If you have any lab test that is abnormal or we need to change your treatment, we will call you to review the results.  Follow-Up: At [facility], you and your health needs are our priority.  As part of our continuing mission to provide you with exceptional heart care, we have created designated Provider Care Teams.  These Care Teams include your primary Cardiologist (physician) and Advanced Practice Providers (APPs -  Physician Assistants and Nurse Practitioners) who all work together to provide you with the care you need, when you need it.  We recommend signing up for the patient portal called "MyChart".  Sign up information is provided on this After Visit Summary.  MyChart is used to connect with patients for Virtual Visits (Telemedicine).  Patients are able to view lab/test results, encounter notes, upcoming appointments, etc.  Non-urgent messages can be sent to your provider as well.   To learn more about what you can do with MyChart, go to [email or web address].    Your next appointment:   6 month(s)  The format for your next appointment:   In Person  Provider:   Dr. [name]  Important Information About Sugar        Follow up 6 months.   Signed, Kemiyah Tarazon [name], MD  02/25/2022 4:26 PM    [address], [place], [place]  [address] Phone: [phone]; Fax: [phone]

## 2022-02-24 ENCOUNTER — Other Ambulatory Visit: Payer: Self-pay | Admitting: Internal Medicine

## 2022-02-24 ENCOUNTER — Other Ambulatory Visit: Payer: Self-pay | Admitting: Cardiology

## 2022-02-25 ENCOUNTER — Ambulatory Visit (HOSPITAL_COMMUNITY): Payer: Medicare (Managed Care) | Attending: Cardiology

## 2022-02-25 ENCOUNTER — Ambulatory Visit (INDEPENDENT_AMBULATORY_CARE_PROVIDER_SITE_OTHER): Payer: Medicare (Managed Care) | Admitting: Cardiology

## 2022-02-25 ENCOUNTER — Encounter: Payer: Self-pay | Admitting: Cardiology

## 2022-02-25 VITALS — BP 100/62 | HR 79 | Ht 71.0 in | Wt 195.0 lb

## 2022-02-25 DIAGNOSIS — I5022 Chronic systolic (congestive) heart failure: Secondary | ICD-10-CM

## 2022-02-25 DIAGNOSIS — I4821 Permanent atrial fibrillation: Secondary | ICD-10-CM

## 2022-02-25 DIAGNOSIS — I420 Dilated cardiomyopathy: Secondary | ICD-10-CM | POA: Insufficient documentation

## 2022-02-25 DIAGNOSIS — I447 Left bundle-branch block, unspecified: Secondary | ICD-10-CM

## 2022-02-25 LAB — ECHOCARDIOGRAM COMPLETE
P 1/2 time: 687 msec
S' Lateral: 3.2 cm

## 2022-02-25 MED ORDER — ROSUVASTATIN CALCIUM 40 MG PO TABS: 40.0000 mg | ORAL_TABLET | Freq: Every day | ORAL | 3 refills | Status: DC

## 2022-02-25 MED ORDER — APIXABAN 5 MG PO TABS
5.0000 mg | ORAL_TABLET | Freq: Two times a day (BID) | ORAL | 3 refills | Status: DC
Start: 2022-02-25 — End: 2022-09-21

## 2022-02-25 MED ORDER — SPIRONOLACTONE 25 MG PO TABS: 25.0000 mg | ORAL_TABLET | Freq: Every day | ORAL | 3 refills | Status: DC

## 2022-02-25 MED ORDER — EMPAGLIFLOZIN 10 MG PO TABS: 10.0000 mg | ORAL_TABLET | Freq: Every day | ORAL | 3 refills | Status: DC

## 2022-02-25 NOTE — Telephone Encounter (Signed)
Prescription refill request for Eliquis received. Indication:Afib Last office visit:upcoming Scr:1.0 Age: 66 Weight:84.8 kg  Prescription refilled

## 2022-02-25 NOTE — Patient Instructions (Signed)
Medication Instructions:  Your physician recommends that you continue on your current medications as directed. Please refer to the Current Medication list given to you today.  *If you need a refill on your cardiac medications before your next appointment, please call your pharmacy*   Lab Work: CMET, CBC, Lipid today  If you have labs (blood work) drawn today and your tests are completely normal, you will receive your results only by: [place] (if you have MyChart) OR A paper copy in the mail If you have any lab test that is abnormal or we need to change your treatment, we will call you to review the results.  Follow-Up: At [facility], you and your health needs are our priority.  As part of our continuing mission to provide you with exceptional heart care, we have created designated Provider Care Teams.  These Care Teams include your primary Cardiologist (physician) and Advanced Practice Providers (APPs -  Physician Assistants and Nurse Practitioners) who all work together to provide you with the care you need, when you need it.  We recommend signing up for the patient portal called "MyChart".  Sign up information is provided on this After Visit Summary.  MyChart is used to connect with patients for Virtual Visits (Telemedicine).  Patients are able to view lab/test results, encounter notes, upcoming appointments, etc.  Non-urgent messages can be sent to your provider as well.   To learn more about what you can do with MyChart, go to [email or web address].    Your next appointment:   6 month(s)  The format for your next appointment:   In Person  Provider:   Dr. [name]  Important Information About Sugar

## 2022-02-26 ENCOUNTER — Other Ambulatory Visit: Payer: Self-pay | Admitting: *Deleted

## 2022-02-26 ENCOUNTER — Other Ambulatory Visit: Payer: Self-pay

## 2022-02-26 ENCOUNTER — Encounter: Payer: Self-pay | Admitting: Cardiology

## 2022-02-26 DIAGNOSIS — I4891 Unspecified atrial fibrillation: Secondary | ICD-10-CM

## 2022-02-26 DIAGNOSIS — I5022 Chronic systolic (congestive) heart failure: Secondary | ICD-10-CM

## 2022-02-26 LAB — COMPREHENSIVE METABOLIC PANEL
ALT: 26 IU/L (ref 0–44)
AST: 24 IU/L (ref 0–40)
Albumin/Globulin Ratio: 1.5 (ref 1.2–2.2)
Albumin: 4.6 g/dL (ref 3.8–4.8)
Alkaline Phosphatase: 77 IU/L (ref 44–121)
BUN/Creatinine Ratio: 25 — ABNORMAL HIGH (ref 10–24)
BUN: 37 mg/dL — ABNORMAL HIGH (ref 8–27)
Bilirubin Total: 0.4 mg/dL (ref 0.0–1.2)
CO2: 20 mmol/L (ref 20–29)
Calcium: 9.5 mg/dL (ref 8.6–10.2)
Chloride: 100 mmol/L (ref 96–106)
Creatinine, Ser: 1.51 mg/dL — ABNORMAL HIGH (ref 0.76–1.27)
Globulin, Total: 3 g/dL (ref 1.5–4.5)
Glucose: 178 mg/dL — ABNORMAL HIGH (ref 70–99)
Potassium: 4.8 mmol/L (ref 3.5–5.2)
Sodium: 135 mmol/L (ref 134–144)
Total Protein: 7.6 g/dL (ref 6.0–8.5)
eGFR: 50 mL/min/{1.73_m2} — ABNORMAL LOW (ref >59)

## 2022-02-26 LAB — CBC
Hematocrit: 45 % (ref 37.5–51.0)
Hemoglobin: 15.1 g/dL (ref 13.0–17.7)
MCH: 31.7 pg (ref 26.6–33.0)
MCHC: 33.6 g/dL (ref 31.5–35.7)
MCV: 95 fL (ref 79–97)
Platelets: 189 10*3/uL (ref 150–450)
RBC: 4.76 x10E6/uL (ref 4.14–5.80)
RDW: 13 % (ref 11.6–15.4)
WBC: 6.5 10*3/uL (ref 3.4–10.8)

## 2022-02-26 LAB — LIPID PANEL
Chol/HDL Ratio: 2.9 ratio (ref 0.0–5.0)
Cholesterol, Total: 144 mg/dL (ref 100–199)
HDL: 49 mg/dL (ref >39)
LDL Chol Calc (NIH): 65 mg/dL (ref 0–99)
Triglycerides: 182 mg/dL — ABNORMAL HIGH (ref 0–149)
VLDL Cholesterol Cal: 30 mg/dL (ref 5–40)

## 2022-02-26 NOTE — Telephone Encounter (Signed)
Spoke with patient and verified order to stop taking furosemide. Patient can use it as needed for weight gain or swelling. Went over Dr. [name] notes on lab work from yesterday. Pt will return In 2-3 weeks for BMET. Orders released.

## 2022-02-26 NOTE — Telephone Encounter (Signed)
Spoke with patient. Jardiance 10 mg samples set aside for patient to pick up.

## 2022-02-26 NOTE — Telephone Encounter (Signed)
This is Dr. [name] pt, he refills this medication. Please address

## 2022-03-01 ENCOUNTER — Encounter: Payer: Self-pay | Admitting: Cardiology

## 2022-03-11 ENCOUNTER — Other Ambulatory Visit: Payer: Self-pay | Admitting: Cardiology

## 2022-03-18 ENCOUNTER — Encounter: Payer: Self-pay | Admitting: Cardiology

## 2022-03-18 NOTE — Telephone Encounter (Signed)
Spoke to patient office out of Jardiance samples.Stated he will be getting new insurance 12/23.Advised I will call back when office receives Jardiance samples.

## 2022-03-30 NOTE — Telephone Encounter (Signed)
close

## 2022-03-31 ENCOUNTER — Other Ambulatory Visit: Payer: Self-pay | Admitting: Cardiology

## 2022-04-23 DIAGNOSIS — I5022 Chronic systolic (congestive) heart failure: Secondary | ICD-10-CM | POA: Diagnosis not present

## 2022-04-24 ENCOUNTER — Encounter: Payer: Self-pay | Admitting: Cardiology

## 2022-04-24 LAB — BASIC METABOLIC PANEL
BUN/Creatinine Ratio: 25 — ABNORMAL HIGH (ref 10–24)
BUN: 31 mg/dL — ABNORMAL HIGH (ref 8–27)
CO2: 19 mmol/L — ABNORMAL LOW (ref 20–29)
Calcium: 9.5 mg/dL (ref 8.6–10.2)
Chloride: 103 mmol/L (ref 96–106)
Creatinine, Ser: 1.25 mg/dL (ref 0.76–1.27)
Glucose: 91 mg/dL (ref 70–99)
Potassium: 5.7 mmol/L — ABNORMAL HIGH (ref 3.5–5.2)
Sodium: 136 mmol/L (ref 134–144)
eGFR: 63 mL/min/{1.73_m2} (ref >59)

## 2022-04-26 ENCOUNTER — Other Ambulatory Visit: Payer: Self-pay

## 2022-04-26 DIAGNOSIS — Z79899 Other long term (current) drug therapy: Secondary | ICD-10-CM

## 2022-04-26 NOTE — Telephone Encounter (Signed)
Patient informed to stop taking spironolactone and get blood work in 2 weeks. He verbalized understanding. Order placed for BMET and mailed to patient.

## 2022-05-18 ENCOUNTER — Other Ambulatory Visit: Payer: Self-pay | Admitting: Cardiology

## 2022-05-21 ENCOUNTER — Other Ambulatory Visit: Payer: Self-pay | Admitting: Cardiology

## 2022-05-31 DIAGNOSIS — L648 Other androgenic alopecia: Secondary | ICD-10-CM | POA: Diagnosis not present

## 2022-05-31 DIAGNOSIS — L820 Inflamed seborrheic keratosis: Secondary | ICD-10-CM | POA: Diagnosis not present

## 2022-05-31 DIAGNOSIS — L7211 Pilar cyst: Secondary | ICD-10-CM | POA: Diagnosis not present

## 2022-06-01 ENCOUNTER — Encounter: Payer: Self-pay | Admitting: Cardiology

## 2022-06-01 ENCOUNTER — Encounter (HOSPITAL_COMMUNITY): Payer: Self-pay

## 2022-06-01 ENCOUNTER — Emergency Department (HOSPITAL_COMMUNITY): Payer: Medicare (Managed Care)

## 2022-06-01 ENCOUNTER — Other Ambulatory Visit: Payer: Self-pay

## 2022-06-01 ENCOUNTER — Emergency Department (HOSPITAL_COMMUNITY)
Admission: EM | Admit: 2022-06-01 | Discharge: 2022-06-01 | Disposition: A | Discharge status: Home / Self Care | Payer: Medicare (Managed Care) | Attending: Emergency Medicine | Admitting: Emergency Medicine

## 2022-06-01 DIAGNOSIS — J811 Chronic pulmonary edema: Secondary | ICD-10-CM | POA: Diagnosis not present

## 2022-06-01 DIAGNOSIS — I509 Heart failure, unspecified: Secondary | ICD-10-CM | POA: Insufficient documentation

## 2022-06-01 DIAGNOSIS — R0602 Shortness of breath: Secondary | ICD-10-CM | POA: Insufficient documentation

## 2022-06-01 DIAGNOSIS — Z91010 Allergy to peanuts: Secondary | ICD-10-CM | POA: Diagnosis not present

## 2022-06-01 DIAGNOSIS — R0603 Acute respiratory distress: Secondary | ICD-10-CM | POA: Diagnosis not present

## 2022-06-01 DIAGNOSIS — I10 Essential (primary) hypertension: Secondary | ICD-10-CM | POA: Diagnosis not present

## 2022-06-01 DIAGNOSIS — I517 Cardiomegaly: Secondary | ICD-10-CM | POA: Diagnosis not present

## 2022-06-01 DIAGNOSIS — R000 Tachycardia, unspecified: Secondary | ICD-10-CM | POA: Diagnosis not present

## 2022-06-01 DIAGNOSIS — R609 Edema, unspecified: Secondary | ICD-10-CM | POA: Diagnosis not present

## 2022-06-01 DIAGNOSIS — Z79899 Other long term (current) drug therapy: Secondary | ICD-10-CM | POA: Insufficient documentation

## 2022-06-01 DIAGNOSIS — R0689 Other abnormalities of breathing: Secondary | ICD-10-CM | POA: Diagnosis not present

## 2022-06-01 DIAGNOSIS — Z743 Need for continuous supervision: Secondary | ICD-10-CM | POA: Diagnosis not present

## 2022-06-01 DIAGNOSIS — Z7901 Long term (current) use of anticoagulants: Secondary | ICD-10-CM | POA: Diagnosis not present

## 2022-06-01 DIAGNOSIS — I110 Hypertensive heart disease with heart failure: Secondary | ICD-10-CM | POA: Diagnosis not present

## 2022-06-01 DIAGNOSIS — R0902 Hypoxemia: Secondary | ICD-10-CM | POA: Diagnosis not present

## 2022-06-01 LAB — COMPREHENSIVE METABOLIC PANEL
ALT: 40 U/L (ref 0–44)
AST: 42 U/L — ABNORMAL HIGH (ref 15–41)
Albumin: 4 g/dL (ref 3.5–5.0)
Alkaline Phosphatase: 66 U/L (ref 38–126)
Anion gap: 10 (ref 5–15)
BUN: 17 mg/dL (ref 8–23)
CO2: 25 mmol/L (ref 22–32)
Calcium: 9.7 mg/dL (ref 8.9–10.3)
Chloride: 106 mmol/L (ref 98–111)
Creatinine, Ser: 1.17 mg/dL (ref 0.61–1.24)
GFR, Estimated: >60 mL/min (ref >60)
Glucose, Bld: 115 mg/dL — ABNORMAL HIGH (ref 70–99)
Potassium: 4.8 mmol/L (ref 3.5–5.1)
Sodium: 141 mmol/L (ref 135–145)
Total Bilirubin: 1.6 mg/dL — ABNORMAL HIGH (ref 0.3–1.2)
Total Protein: 7.6 g/dL (ref 6.5–8.1)

## 2022-06-01 LAB — CBC WITH DIFFERENTIAL/PLATELET
Abs Immature Granulocytes: 0.02 10*3/uL (ref 0.00–0.07)
Basophils Absolute: 0.1 10*3/uL (ref 0.0–0.1)
Basophils Relative: 1 %
Eosinophils Absolute: 0.3 10*3/uL (ref 0.0–0.5)
Eosinophils Relative: 5 %
HCT: 47.5 % (ref 39.0–52.0)
Hemoglobin: 15.5 g/dL (ref 13.0–17.0)
Immature Granulocytes: 0 %
Lymphocytes Relative: 27 %
Lymphs Abs: 1.6 10*3/uL (ref 0.7–4.0)
MCH: 32.4 pg (ref 26.0–34.0)
MCHC: 32.6 g/dL (ref 30.0–36.0)
MCV: 99.2 fL (ref 80.0–100.0)
Monocytes Absolute: 0.7 10*3/uL (ref 0.1–1.0)
Monocytes Relative: 12 %
Neutro Abs: 3.3 10*3/uL (ref 1.7–7.7)
Neutrophils Relative %: 55 %
Platelets: 219 10*3/uL (ref 150–400)
RBC: 4.79 MIL/uL (ref 4.22–5.81)
RDW: 13.9 % (ref 11.5–15.5)
WBC: 6.1 10*3/uL (ref 4.0–10.5)
nRBC: 0 % (ref 0.0–0.2)

## 2022-06-01 LAB — BRAIN NATRIURETIC PEPTIDE: B Natriuretic Peptide: 773.2 pg/mL — ABNORMAL HIGH (ref 0.0–100.0)

## 2022-06-01 LAB — TROPONIN I (HIGH SENSITIVITY)
Troponin I (High Sensitivity): 11 ng/L (ref <18)
Troponin I (High Sensitivity): 20 ng/L — ABNORMAL HIGH (ref <18)

## 2022-06-01 MED ORDER — ISOSORBIDE MONONITRATE ER 30 MG PO TB24
30.0000 mg | ORAL_TABLET | Freq: Every day | ORAL | Status: DC
  Administered 2022-06-01: 30 mg via ORAL
  Filled 2022-06-01: qty 1

## 2022-06-01 MED ORDER — NITROGLYCERIN 0.4 MG SL SUBL: 0.4000 mg | SUBLINGUAL_TABLET | SUBLINGUAL | Status: DC | PRN

## 2022-06-01 MED ORDER — FUROSEMIDE 10 MG/ML IJ SOLN
80.0000 mg | Freq: Once | INTRAMUSCULAR | Status: AC
  Administered 2022-06-01: 80 mg via INTRAVENOUS
  Filled 2022-06-01: qty 8

## 2022-06-01 MED ORDER — METOPROLOL SUCCINATE ER 25 MG PO TB24
100.0000 mg | ORAL_TABLET | Freq: Every morning | ORAL | Status: DC
  Administered 2022-06-01: 100 mg via ORAL
  Filled 2022-06-01: qty 4

## 2022-06-01 MED ORDER — APIXABAN 5 MG PO TABS
5.0000 mg | ORAL_TABLET | Freq: Two times a day (BID) | ORAL | Status: DC
  Administered 2022-06-01: 5 mg via ORAL
  Filled 2022-06-01: qty 1

## 2022-06-01 NOTE — Telephone Encounter (Signed)
Yes he should stay on lasix until he can have follow up visit arranged with me or APP. He has chronic AFib but had increased CHF in ED.   Perle Brickhouse [name] MD, [facility]

## 2022-06-01 NOTE — ED Triage Notes (Signed)
Pt was at home asleep when he woke up with SOB. Ems ARRIVED AND GAVE 0.4 NITRO aND PLACED ON CPAP. PT CHANGED TO BIPAP IN ed.

## 2022-06-01 NOTE — Discharge Instructions (Signed)
You were seen today for shortness of breath.  Your lab work demonstrates a mild troponin elevation.  We offered ongoing emergency department observation and further troponin testing, however you would prefer outpatient management at this time.  We have gotten you off the oxygen but you needed when you arrived, though it appears you are suffering from a heart failure exacerbation.  I recommend you restart your Lasix at 40 mg daily and return if you have any ongoing cardiac symptoms.  Additionally, please call your cardiologist today to make an outpatient appointment.  Thank for the opportunity participate in your care, Dennis Sciara MD

## 2022-06-01 NOTE — ED Provider Notes (Signed)
[facility] EMERGENCY DEPARTMENT Provider Note   CSN: [id number] Arrival date & time: 06/01/22  0430     History {Add pertinent medical, surgical, social history, OB history to HPI:1} Chief Complaint  Patient presents with   Respiratory Distress    Dennis Lewis is a 66 y.o. male.  C67-year-old male who presents the ER today secondary to shortness of breath.  Patient has a history of heart failure and a long cardiac history of unclear etiology.  He is diagnosed with nonischemic cardiomyopathy.  He had a NSTEMI a while ago that was thought to be embolic as he had no actual significant coronary artery disease on catheterization.  His ultrasounds have been as low as 20% ejection fraction all the way up to as high as 45% over the last 2 years.  He reportedly stopped taking his Lasix at the request of his doctors about a month ago for unclear reasons.  He has a history of gout on colchicine for that.  Per the cardiology note patient been doing much better with exercise, not drinking not smoking.  States he went to bed normal state of health but then woke up significant short of breath similar to when he had heart failure exacerbation in the past.  EMS got there his sats were reportedly 70% on room air was started on BiPAP and brought here for further evaluation.  Patient states he does feel little bit bloated but no lower extremity swelling.  States he never really has lower extremity swelling.  No fevers or cough.  No wheezing.        Home Medications Prior to Admission medications   Medication Sig Start Date End Date Taking? Authorizing Provider  apixaban (ELIQUIS) 5 MG TABS tablet Take 1 tablet (5 mg total) by mouth 2 (two) times daily. 02/25/22   [name], [name], MD  Colchicine (MITIGARE) 0.6 MG CAPS TAKE ONE CAPSULE BY MOUTH TWO TIMES A DAY FOR ONE WEEK THEN TAKE ONE CAPSULE BY MOUTH DAILY 08/06/21   [name], [name], MD  colchicine 0.6 MG tablet Take 1 tablet (0.6 mg total)  by mouth daily. 12/18/18   [name], [name], MD  empagliflozin (JARDIANCE) 10 MG TABS tablet Take 1 tablet (10 mg total) by mouth daily before breakfast. 02/25/22   [name], [name], MD  ENTRESTO 97-103 MG Take 1 tablet by mouth twice daily 05/19/22   [name], [name], MD  fluticasone [facility]) 50 MCG/ACT nasal spray fluticasone propionate 50 mcg/actuation nasal spray,suspension  USE 1 SPRAY(S) IN EACH NOSTRIL ONCE DAILY    [provider]  furosemide (LASIX) 20 MG tablet TAKE 2 TABLETS BY MOUTH ONCE DAILY . 03/02/21   [name], [name], MD  isosorbide mononitrate (IMDUR) 30 MG 24 hr tablet TAKE 1 TABLET BY MOUTH ONCE DAILY. DO NOT CRUSH 03/12/22   [name], [name], MD  loratadine (CLARITIN) 10 MG tablet Take 1 tablet (10 mg total) by mouth daily. 12/10/20   Lorrene Reid, PA-C  methocarbamol (ROBAXIN) 500 MG tablet methocarbamol 500 mg tablet  TAKE 1 TABLET BY MOUTH THREE TIMES DAILY AS NEEDED FOR 10 DAYS    [provider]  metoprolol succinate (TOPROL-XL) 100 MG 24 hr tablet TAKE 1 TABLET BY MOUTH IN THE MORNING 09/07/21   [name], [name], MD  metoprolol succinate (TOPROL-XL) 50 MG 24 hr tablet TAKE 1 TABLET BY MOUTH IN THE EVENING WITH FOOD OR  IMMEDIATELY  FOLLOWING  A  MEAL 09/07/21   [name], Peter  M, MD  nitroGLYCERIN (NITROSTAT) 0.4 MG SL tablet Place 1 tablet under the tongue every 5 (five) minutes as needed for chest pain. X 3 doses 07/05/16   [provider]  rosuvastatin (CRESTOR) 40 MG tablet Take 1 tablet (40 mg total) by mouth daily. 04/01/22   [name], [name], MD  valACYclovir (VALTREX) 1000 MG tablet Take 1 tablet by mouth three times daily as needed 09/30/21   Lorrene Reid, PA-C      Allergies    Chocolate flavor, Ace inhibitors, Chocolate, Other, Peanut-containing drug products, Tramadol, and Hydralazine    Review of Systems   Review of Systems  Physical Exam Updated Vital Signs BP (!) 145/99   Pulse 81   Temp 97.7 F (36.5 C) (Oral)   Resp (!) 27    Ht '5\' 11"'$  (1.803 m)   Wt 88.5 kg   SpO2 97%   BMI 27.21 kg/m  Physical Exam Vitals and nursing note reviewed.  Constitutional:      Appearance: He is well-developed.  HENT:     Head: Normocephalic and atraumatic.     Nose: No congestion or rhinorrhea.  Eyes:     Pupils: Pupils are equal, round, and reactive to light.  Cardiovascular:     Rate and Rhythm: Tachycardia present.  Pulmonary:     Effort: Respiratory distress present.     Breath sounds: Rales present.  Abdominal:     General: There is no distension.  Musculoskeletal:        General: Normal range of motion.     Cervical back: Normal range of motion.  Skin:    General: Skin is warm and dry.  Neurological:     General: No focal deficit present.     Mental Status: He is alert.     ED Results / Procedures / Treatments   Labs (all labs ordered are listed, but only abnormal results are displayed) Labs Reviewed  CBC WITH DIFFERENTIAL/PLATELET  COMPREHENSIVE METABOLIC PANEL  BRAIN NATRIURETIC PEPTIDE  TROPONIN I (HIGH SENSITIVITY)    EKG None  Radiology No results found.  Procedures .Critical Care  Performed by: Merrily Pew, MD Authorized by: Merrily Pew, MD   Critical care provider statement:    Critical care time (minutes):  30   Critical care was necessary to treat or prevent imminent or life-threatening deterioration of the following conditions:  Respiratory failure   Critical care was time spent personally by me on the following activities:  Development of treatment plan with patient or surrogate, discussions with consultants, evaluation of patient's response to treatment, examination of patient, ordering and review of laboratory studies, ordering and review of radiographic studies, ordering and performing treatments and interventions, pulse oximetry, re-evaluation of patient's condition and review of old charts    Medications Ordered in ED Medications - No data to display  ED Course/ Medical  Decision Making/ A&P                           Medical Decision Making Amount and/or Complexity of Data Reviewed Labs: ordered. Radiology: ordered.  Risk Prescription drug management.  Suspect recurrent pulmonary edema, will await CXR for further eval.  CXR done and showed pulmonary edema (independently viewed and interpreted by myself and radiology read reviewed).  Lasix/NTG initiated.   On reeval very comfortable, diuresed 1.5L. will transition to [place]. Pending labs for admission.    {Document critical care time when appropriate:1} {Document review of labs and clinical  decision tools ie heart score, Chads2Vasc2 etc:1}  {Document your independent review of radiology images, and any outside records:1} {Document your discussion with family members, caretakers, and with consultants:1} {Document social determinants of health affecting pt's care:1} {Document your decision making why or why not admission, treatments were needed:1} Final Clinical Impression(s) / ED Diagnoses Final diagnoses:  None    Rx / DC Orders ED Discharge Orders     None

## 2022-06-19 NOTE — Progress Notes (Unsigned)
Cardiology Office Note    Date:  06/19/2022   ID:  Dennis Lewis, DOB 11-16-1955, MRN [id number]  PCP:  [name], Naelani Lafrance M, MD  Cardiologist:  Dr. Mico Spark [name]    History of Present Illness:  Dennis Lewis is a 66 y.o. male seen for follow up CHF. He has a PMH of hypertension, GERD, hyperlipidemia, chronic LBBB, history of NSVT, prostate cancer s/p radiation therapy, persistent atrial flutter, and NICM with EF 35-40%. He has a history of Etoh abuse.  He was admitted for NSTEMI on 06/06/2016, he underwent emergent cardiac catheterization which only showed 30% proximal RCA disease, 25-35% ejection fraction, pattern of apical wall motion abnormality.  Surprisingly, his troponin peaked at 31.36 which is a lot higher than expected for stress-induced cardiomyopathy. Retrospectively, it is possible that he had an embolic MI.  Imdur and hydralazine were added. Initial echo obtained in September showed ejection fraction 25-30%. He was also noted to have new atrial fibrillation, due to elevated CHA2DS2-Vasc score, he was started on eliquis. Repeat echocardiogram 6 weeks later on 07/20/2016 showed ejection fraction has improved to 62-69%, grade 3 diastolic dysfunction, paradoxical ventricular septal motion consistent with LBBB.   He was seen in May 2019  for preoperative clearance prior to right elbow and upper arm procedure. Repeat Echo was ordered but rescheduled multiple times and finally performed in September 2019. This demonstrated reduced LV function with EF 25-30% .  His persistent atrial fib/flutter  rate was controlled. He was started on Entresto and dose titrated by Pharm D. He was admitted overnight with CHF exacerbation on 09/16/18. Lasix had been recently reduced due to gout. Afib rate was elevated.  He was seen in January 2020 and spironolactone was increased. 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 Walks 3 miles a day at a brisk pace.  He denies any palpitations, dyspnea or edema. Energy level is good. He has been spending a lot of time in [place] with his son who is a Estate agent.  He is abstinent from Etoh. Denies orthopnea, PND, palpitations. He did have follow up Echo today showing improvement in EF to 40-45%. On his visit in June we recommended using lasix only PRN. We also stopped aldactone due to hyperkalemia. He presented to the ED on 9/12 with increased CHF. CXR showed pulmonary edema. BNP was 777. Was diuresed and DC back on lasix.     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   GERD (gastroesophageal reflux disease)    H/O cardiovascular stress test 2010   [name]   H/O echocardiogram 2010    mild LVH with some septal dyssynergy, EF 45%, impaired relaxation   History of prostate cancer    Hyperlipidemia    Hypertension    Left bundle branch block    NSVT (nonsustained ventricular tachycardia) (HCC)    Palpitations    Prostate cancer ([place]) 04/02/14   Gleason 7, volume 30 gm   S/P radiation therapy 06/26/2014 through 08/22/2014  Prostate 7800 cGy in 40 sessions                         Torn tendon    right shoulder    Past Surgical History:  Procedure Laterality Date   [place]   Normal cors   CARDIAC CATHETERIZATION N/A 06/06/2016   Procedure: Left Heart Cath and Coronary Angiography;  Surgeon: Jettie Booze, MD;  Location: [facility];  Service: Cardiovascular;  Laterality: N/A;   PROSTATE BIOPSY  04/02/14   Gleason 7, vol 30 gm    Current Medications: Allergies as of 06/21/2022       Reactions   Chocolate Flavor Swelling   Ace Inhibitors Other (See Comments)   Cold sores   Chocolate Other (See Comments)   Blisters    Other Other (See Comments)   Seed on bread causes lip blisters Anesthesia also, but does nor remember which one   Peanut-containing Drug Products Other (See Comments)   Blisters   Tramadol    Hydralazine Palpitations   Irregular heartbeat, fluttering        Medication List        Accurate as of June 19, 2022 10:49 AM. If you have any questions, ask your nurse or doctor.          apixaban 5 MG Tabs tablet Commonly known as: Eliquis Take 1 tablet (5 mg total) by mouth 2 (two) times daily.   colchicine 0.6 MG tablet Take 1 tablet (0.6 mg total) by mouth daily.   Colchicine 0.6 MG Caps Commonly known as: Mitigare TAKE ONE CAPSULE BY MOUTH TWO TIMES A DAY FOR ONE WEEK THEN TAKE ONE CAPSULE BY MOUTH DAILY   empagliflozin 10 MG Tabs tablet Commonly known as: Jardiance Take 1 tablet (10 mg total) by mouth daily before breakfast.   Entresto 97-103 MG Generic drug: sacubitril-valsartan Take 1 tablet by mouth twice daily   fluticasone 50 MCG/ACT nasal spray Commonly known as: FLONASE fluticasone propionate 50 mcg/actuation nasal spray,suspension  USE 1 SPRAY(S) IN EACH NOSTRIL ONCE DAILY   furosemide 20 MG tablet Commonly known as: LASIX TAKE 2 TABLETS BY MOUTH ONCE DAILY .   isosorbide mononitrate 30 MG 24 hr tablet Commonly known as: IMDUR TAKE 1 TABLET BY MOUTH ONCE DAILY. DO NOT CRUSH   loratadine 10 MG tablet Commonly known as: CLARITIN Take 1 tablet (10 mg total) by mouth daily.   methocarbamol 500 MG tablet Commonly known as: ROBAXIN methocarbamol 500 mg tablet  TAKE 1 TABLET BY MOUTH THREE TIMES DAILY AS NEEDED FOR 10 DAYS   metoprolol succinate 50 MG 24 hr tablet Commonly known as: TOPROL-XL TAKE 1 TABLET BY MOUTH IN THE EVENING WITH FOOD OR  IMMEDIATELY  FOLLOWING  A  MEAL   metoprolol succinate 100 MG 24 hr tablet Commonly known as: TOPROL-XL TAKE 1 TABLET BY MOUTH IN THE MORNING   nitroGLYCERIN 0.4 MG SL tablet Commonly known as:  NITROSTAT Place 1 tablet under the tongue every 5 (five) minutes as needed for chest pain. X 3 doses   rosuvastatin 40 MG tablet Commonly known as: CRESTOR Take 1 tablet (40 mg total) by mouth daily.   valACYclovir 1000 MG tablet Commonly known as: VALTREX Take 1 tablet by mouth three times daily as needed        Allergies:   Chocolate flavor, Ace inhibitors, Chocolate, Other, Peanut-containing drug products, Tramadol, and Hydralazine  Social History   Socioeconomic History   Marital status: Married    Spouse name: Not on file   Number of children: 2   Years of education: Not on file   Highest education level: Not on file  Occupational History   Occupation: Nurse, adult  Tobacco Use   Smoking status: Former    Types: Cigarettes    Quit date: 02/22/1979    Years since quitting: 43.3   Smokeless tobacco: Never  Vaping Use   Vaping Use: Never used  Substance and Sexual Activity   Alcohol use: Yes    Alcohol/week: 28.0 standard drinks of alcohol    Types: 28 Cans of beer per week    Comment: beer daily - 4 daily   Drug use: No   Sexual activity: Not Currently  Other Topics Concern   Not on file  Social History Narrative   Not on file   Social Determinants of Health   Financial Resource Strain: Not on file  Food Insecurity: Not on file  Transportation Needs: Not on file  Physical Activity: Not on file  Stress: Not on file  Social Connections: Not on file     Family History:  The patient's family history includes Esophageal cancer in his father; Prostate cancer in his brother; Throat cancer (age of onset: 3) in his father.   ROS:   Please see the history of present illness.    ROSd All other systems reviewed and are negative.   PHYSICAL EXAM:   VS:  There were no vitals taken for this visit.  GENERAL:  Well appearing WM in NAD HEENT:  PERRL, EOMI, sclera are clear. Oropharynx is clear. NECK:  No jugular venous distention, carotid upstroke brisk and  symmetric, no bruits, no thyromegaly or adenopathy LUNGS:  Clear to auscultation bilaterally CHEST:  Unremarkable HEART:  IRRR,  PMI not displaced or sustained,S1 and S2 within normal limits, no S3, no S4: no clicks, no rubs, no murmurs ABD:  Soft, nontender. BS +, no masses or bruits. No hepatomegaly, no splenomegaly EXT:  2 + pulses throughout, no edema, no cyanosis no clubbing SKIN:  Warm and dry.  No rashes NEURO:  Alert and oriented x 3. Cranial nerves II through XII intact. PSYCH:  Cognitively intact    Wt Readings from Last 3 Encounters:  06/01/22 195 lb 1.7 oz (88.5 kg)  02/25/22 195 lb (88.5 kg)  02/04/21 187 lb (84.8 kg)      Studies/Labs Reviewed:   EKG:  EKG is ordered today.  Atrial fib/ with rate 79. LBBB. I have personally reviewed and interpreted this study.    Recent Labs: 06/01/2022: ALT 40; B Natriuretic Peptide 773.2; BUN 17; Creatinine, Ser 1.17; Hemoglobin 15.5; Platelets 219; Potassium 4.8; Sodium 141   Lipid Panel    Component Value Date/Time   CHOL 144 02/25/2022 1630   TRIG 182 (H) 02/25/2022 1630   HDL 49 02/25/2022 1630   CHOLHDL 2.9 02/25/2022 1630   LDLCALC 65 02/25/2022 1630    Additional studies/ records that were reviewed today include:  Labs dated 02/16/17: cholesterol 255, triglycerides 291, HDL 44, LDL 153. Chemistries, CBC, TSH normal.   Cath 06/06/2016 Conclusion     Prox RCA lesion, 30 %stenosed at a bend. The left ventricular ejection fraction is 25-35% by visual estimate. The pattern of apical wall motion abnormality without severe CAD is suggestive of Takotsubo cardiomyopathy. LV end diastolic pressure is moderately elevated. There is no aortic valve stenosis.   Continue with  aggressive medical therapy. He is allergic to ace inhibitors. Will add isosorbide to his hydralazine. Continue beta blocker. 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 He states that he was  lying in bed for about 3 hours very worried about what he would do. He then started to have chest discomfort. The history is also consistent with Takatsubo cardiomyopathy.  May need diuresis given moderately elevated LVEDP.      Echo 07/20/2016 LV EF: 40% -   45%   Study Conclusions   - Left ventricle: The cavity size was normal. Systolic function was   mildly to moderately reduced. The estimated ejection fraction was   in the range of 40% to 45%. Diffuse hypokinesis. There was a   reduced contribution of atrial contraction to ventricular   filling, due to increased ventricular diastolic pressure or   atrial contractile dysfunction. Doppler parameters are consistent   with a reversible restrictive pattern, indicative of decreased   left ventricular diastolic compliance and/or increased left   atrial pressure (grade 3 diastolic dysfunction). - Ventricular septum: Septal motion showed moderate paradox. These   changes are consistent with intraventricular conduction delay. - Left atrium: The atrium was mildly dilated. - Pulmonary arteries: PA peak pressure: 40 mm Hg (S).   Impressions:   - The right ventricular systolic pressure was increased consistent   with mild pulmonary hypertension.   Echo 05/31/18: Study Conclusions   - Left ventricle: Systolic function was severely reduced. The   estimated ejection fraction was in the range of 25% to 30%. - Mitral valve: There was mild regurgitation.   Echo 02/25/22: IMPRESSIONS     1. Left ventricular ejection fraction, by estimation, is 40 to 45%. The  left ventricle has mildly decreased function. The left ventricle  demonstrates global hypokinesis. There is mild left ventricular  hypertrophy. Left ventricular diastolic parameters  are consistent with Grade I diastolic dysfunction (impaired relaxation).   2. Right ventricular systolic function is normal. The right ventricular  size is mildly enlarged.   3. The mitral valve is normal  in structure. No evidence of mitral valve  regurgitation. No evidence of mitral stenosis.   4. The aortic valve is tricuspid. There is mild calcification of the  aortic valve. There is mild thickening of the aortic valve. Aortic valve  regurgitation is mild. Aortic valve sclerosis is present, with no evidence  of aortic valve stenosis.   5. The inferior vena cava is normal in size with greater than 50%  respiratory variability, suggesting right atrial pressure of 3 mmHg.   Comparison(s): The left ventricular function has improved. Prior EF  25-30%.   ASSESSMENT:    No diagnosis found.    PLAN:  In order of problems listed above:    Chronic systolic CHF. NYHA functional class 1.  NICM:  Now on good medical therapy with Entresto, Toprol XL, spironolactone, Jardiance, and lasix.  HR is well controlled. He did have an Echo today. EF has improved from 25-30% to 40-45%/  LV dysfunction is multifactorial related to prior Etoh abuse, dyssynergy with LBBB, prior embolic event, Afib with uncontrolled rate. He is on optimal medical therapy now. Intolerant of aldactone due to hyperkalemia. Now back on lasix.   Chronic atrial fib/flutter: on eliquis '5mg'$  BID. CHA2DS2-Vasc score 2 (HTN, HF).  Rate is now well controlled.   Hypertension: controlled  Hyperlipidemia: at goal on statin therapy.  Check lab today.  5.   Gout.  No recurrent symptoms.   Medication Adjustments/Labs and Tests Ordered: Current medicines are reviewed at length with the patient today.  Concerns regarding medicines are outlined above.  Medication changes, Labs and Tests ordered today are listed in the Patient Instructions below. There are no Patient Instructions on file for this visit.   Follow up 6 months.   Signed, Vonna Brabson [name], MD  06/19/2022 10:49 AM    [facility] [address], [place], [place]  [address] Phone: [phone]; Fax: [phone]

## 2022-06-21 ENCOUNTER — Ambulatory Visit: Payer: Medicare (Managed Care) | Attending: Cardiology | Admitting: Cardiology

## 2022-06-21 ENCOUNTER — Encounter: Payer: Self-pay | Admitting: Cardiology

## 2022-06-21 VITALS — BP 118/80 | HR 74 | Ht 69.0 in | Wt 193.4 lb

## 2022-06-21 DIAGNOSIS — I447 Left bundle-branch block, unspecified: Secondary | ICD-10-CM | POA: Diagnosis not present

## 2022-06-21 DIAGNOSIS — I10 Essential (primary) hypertension: Secondary | ICD-10-CM | POA: Diagnosis not present

## 2022-06-21 DIAGNOSIS — I5022 Chronic systolic (congestive) heart failure: Secondary | ICD-10-CM | POA: Diagnosis not present

## 2022-06-21 NOTE — Patient Instructions (Signed)
Stop isosorbide   Continue lasix 20 mg daily  You will be OK to try Cialis

## 2022-06-22 LAB — BASIC METABOLIC PANEL
BUN/Creatinine Ratio: 26 — ABNORMAL HIGH (ref 10–24)
BUN: 27 mg/dL (ref 8–27)
CO2: 20 mmol/L (ref 20–29)
Calcium: 9.3 mg/dL (ref 8.6–10.2)
Chloride: 102 mmol/L (ref 96–106)
Creatinine, Ser: 1.02 mg/dL (ref 0.76–1.27)
Glucose: 95 mg/dL (ref 70–99)
Potassium: 4.9 mmol/L (ref 3.5–5.2)
Sodium: 140 mmol/L (ref 134–144)
eGFR: 81 mL/min/{1.73_m2} (ref >59)

## 2022-06-25 ENCOUNTER — Other Ambulatory Visit: Payer: Self-pay | Admitting: Cardiology

## 2022-06-29 ENCOUNTER — Other Ambulatory Visit: Payer: Self-pay | Admitting: Cardiology

## 2022-07-14 ENCOUNTER — Encounter: Payer: Self-pay | Admitting: Cardiology

## 2022-08-15 ENCOUNTER — Other Ambulatory Visit: Payer: Self-pay | Admitting: Cardiology

## 2022-08-19 DIAGNOSIS — Z8546 Personal history of malignant neoplasm of prostate: Secondary | ICD-10-CM | POA: Diagnosis not present

## 2022-08-19 NOTE — Progress Notes (Deleted)
Cardiology Office Note    Date:  08/19/2022   ID:  Dennis Lewis, DOB 27-Apr-1956, MRN [id number]  PCP:  Dennis Lewis, Dennis Lewis M, MD  Cardiologist:  Dr. Cathy Crounse Dennis Lewis    History of Present Illness:  Dennis Lewis is a 66 y.o. male seen for follow up CHF. He has a PMH of hypertension, GERD, hyperlipidemia, chronic LBBB, history of NSVT, prostate cancer s/p radiation therapy, persistent atrial flutter, and NICM with EF 35-40%. He has a history of Etoh abuse.  He was admitted for NSTEMI on 06/06/2016, he underwent emergent cardiac catheterization which only showed 30% proximal RCA disease, 25-35% ejection fraction, pattern of apical wall motion abnormality.  Surprisingly, his troponin peaked at 31.36 which is a lot higher than expected for stress-induced cardiomyopathy. Retrospectively, it is possible that he had an embolic MI.  Imdur and hydralazine were added. Initial echo obtained in September showed ejection fraction 25-30%. He was also noted to have new atrial fibrillation, due to elevated CHA2DS2-Vasc score, he was started on eliquis. Repeat echocardiogram 6 weeks later on 07/20/2016 showed ejection fraction has improved to 10-62%, grade 3 diastolic dysfunction, paradoxical ventricular septal motion consistent with LBBB.   He was seen in May 2019  for preoperative clearance prior to right elbow and upper arm procedure. Repeat Echo was ordered but rescheduled multiple times and finally performed in September 2019. This demonstrated reduced LV function with EF 25-30% .  His persistent atrial fib/flutter  rate was controlled. He was started on Entresto and dose titrated by Pharm D. He was admitted overnight with CHF exacerbation on 09/16/18. Lasix had been recently reduced due to gout. Afib rate was elevated.  He was seen in January 2020 and spironolactone was increased. Since then we have worked on optimizing medical therapy  He did have follow up Echo showing improvement in EF to 40-45%. On his visit in June  we recommended using lasix only PRN. We also stopped aldactone due to hyperkalemia. He presented to the ED on 9/12 with increased CHF. CXR showed pulmonary edema. BNP was 777. Was diuresed and DC back on lasix.   Since resuming his lasix he has no further difficulty. Denies any SOB or edema. No chest pain. Is following a healthy diet and walking 3.5 miles/day. He does note some ED and wants to try taking Cialis.     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   GERD (gastroesophageal reflux disease)    H/O cardiovascular stress test 2010   [name]   H/O echocardiogram 2010    mild LVH with some septal dyssynergy, EF 45%, impaired relaxation   History of prostate cancer    Hyperlipidemia    Hypertension    Left bundle branch block    NSVT (nonsustained ventricular tachycardia) (HCC)    Palpitations    Prostate cancer ([place]) 04/02/14   Gleason 7, volume 30 gm   S/P radiation therapy 06/26/2014 through 08/22/2014                                                      Prostate 7800 cGy in 40 sessions  Torn tendon    right shoulder    Past Surgical History:  Procedure Laterality Date   [place]   Normal cors   CARDIAC CATHETERIZATION N/A 06/06/2016   Procedure: Left Heart Cath and Coronary Angiography;  Surgeon: Jettie Booze, MD;  Location: [facility];  Service: Cardiovascular;  Laterality: N/A;   PROSTATE BIOPSY  04/02/14   Gleason 7, vol 30 gm    Current Medications: Allergies as of 08/23/2022       Reactions   Chocolate Flavor Swelling   Ace Inhibitors Other (See Comments)   Cold sores   Chocolate Other (See Comments)   Blisters   Other Other (See Comments)   Seed on bread causes lip blisters Anesthesia also, but does nor  remember which one   Peanut-containing Drug Products Other (See Comments)   Blisters   Tramadol    Hydralazine Palpitations   Irregular heartbeat, fluttering        Medication List        Accurate as of August 19, 2022  2:07 PM. If you have any questions, ask your nurse or doctor.          apixaban 5 MG Tabs tablet Commonly known as: Eliquis Take 1 tablet (5 mg total) by mouth 2 (two) times daily.   colchicine 0.6 MG tablet Take 1 tablet (0.6 mg total) by mouth daily.   Colchicine 0.6 MG Caps Commonly known as: Mitigare TAKE ONE CAPSULE BY MOUTH TWO TIMES A DAY FOR ONE WEEK THEN TAKE ONE CAPSULE BY MOUTH DAILY   empagliflozin 10 MG Tabs tablet Commonly known as: Jardiance Take 1 tablet (10 mg total) by mouth daily before breakfast.   Entresto 97-103 MG Generic drug: sacubitril-valsartan Take 1 tablet by mouth twice daily   furosemide 20 MG tablet Commonly known as: LASIX Take 2 tablets by mouth once daily   loratadine 10 MG tablet Commonly known as: CLARITIN Take 1 tablet (10 mg total) by mouth daily.   methocarbamol 500 MG tablet Commonly known as: ROBAXIN methocarbamol 500 mg tablet  TAKE 1 TABLET BY MOUTH THREE TIMES DAILY AS NEEDED FOR 10 DAYS   metoprolol succinate 50 MG 24 hr tablet Commonly known as: TOPROL-XL TAKE 1 TABLET BY MOUTH IN THE EVENING WITH FOOD OR  IMMEDIATELY  FOLLOWING  A  MEAL   metoprolol succinate 100 MG 24 hr tablet Commonly known as: TOPROL-XL TAKE 1 TABLET BY MOUTH IN THE MORNING   nitroGLYCERIN 0.4 MG SL tablet Commonly known as: NITROSTAT Place 1 tablet under the tongue every 5 (five) minutes as needed for chest pain. X 3 doses   rosuvastatin 40 MG tablet Commonly known as: CRESTOR TAKE 1 TABLET BY MOUTH DAILY   valACYclovir 1000 MG tablet Commonly known as: VALTREX Take 1 tablet by mouth three times daily as needed        Allergies:   Chocolate flavor, Ace inhibitors, Chocolate, Other, Peanut-containing drug  products, Tramadol, and Hydralazine   Social History   Socioeconomic History   Marital status: Married    Spouse name: Not on file   Number of children: 2   Years of education: Not on file   Highest education level: Not on file  Occupational History   Occupation: Nurse, adult  Tobacco Use   Smoking status: Former    Types: Cigarettes    Quit date: 02/22/1979    Years since quitting: 43.5   Smokeless tobacco: Never  Vaping Use   Vaping Use: Never used  Substance and Sexual Activity   Alcohol use: Yes    Alcohol/week: 28.0 standard drinks of alcohol    Types: 28 Cans of beer per week    Comment: beer daily - 4 daily   Drug use: No   Sexual activity: Not Currently  Other Topics Concern   Not on file  Social History Narrative   Not on file   Social Determinants of Health   Financial Resource Strain: Not on file  Food Insecurity: Not on file  Transportation Needs: Not on file  Physical Activity: Not on file  Stress: Not on file  Social Connections: Not on file     Family History:  The patient's family history includes Esophageal cancer in his father; Prostate cancer in his brother; Throat cancer (age of onset: 55) in his father.   ROS:   Please see the history of present illness.    ROSd All other systems reviewed and are negative.   PHYSICAL EXAM:   VS:  There were no vitals taken for this visit.  GENERAL:  Well appearing WM in NAD HEENT:  PERRL, EOMI, sclera are clear. Oropharynx is clear. NECK:  No jugular venous distention, carotid upstroke brisk and symmetric, no bruits, no thyromegaly or adenopathy LUNGS:  Clear to auscultation bilaterally CHEST:  Unremarkable HEART:  IRRR,  PMI not displaced or sustained,S1 and S2 within normal limits, no S3, no S4: no clicks, no rubs, no murmurs ABD:  Soft, nontender. BS +, no masses or bruits. No hepatomegaly, no splenomegaly EXT:  2 + pulses throughout, no edema, no cyanosis no clubbing SKIN:  Warm and dry.  No  rashes NEURO:  Alert and oriented x 3. Cranial nerves II through XII intact. PSYCH:  Cognitively intact    Wt Readings from Last 3 Encounters:  06/21/22 193 lb 6.4 oz (87.7 kg)  06/01/22 195 lb 1.7 oz (88.5 kg)  02/25/22 195 lb (88.5 kg)      Studies/Labs Reviewed:   EKG:  EKG is not  ordered today.      Recent Labs: 06/01/2022: ALT 40; B Natriuretic Peptide 773.2; Hemoglobin 15.5; Platelets 219 06/21/2022: BUN 27; Creatinine, Ser 1.02; Potassium 4.9; Sodium 140   Lipid Panel    Component Value Date/Time   CHOL 144 02/25/2022 1630   TRIG 182 (H) 02/25/2022 1630   HDL 49 02/25/2022 1630   CHOLHDL 2.9 02/25/2022 1630   LDLCALC 65 02/25/2022 1630    Additional studies/ records that were reviewed today include:  Labs dated 02/16/17: cholesterol 255, triglycerides 291, HDL 44, LDL 153. Chemistries, CBC, TSH normal.   Cath 06/06/2016 Conclusion     Prox RCA lesion, 30 %stenosed at a bend. The left ventricular ejection fraction is 25-35% by visual estimate. The pattern of apical wall motion abnormality without severe CAD is suggestive of Takotsubo cardiomyopathy. LV end diastolic pressure is moderately elevated. There is no aortic valve stenosis.   Continue with aggressive medical therapy. He is allergic to ace inhibitors. Will add isosorbide to his hydralazine. Continue beta blocker. 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 He states that he was lying in bed for about 3 hours very worried about what he would do. He then started to have chest discomfort. The history is also consistent with Takatsubo cardiomyopathy.  May need diuresis given moderately elevated LVEDP.      Echo 07/20/2016 LV EF: 40% -  45%   Study Conclusions   - Left ventricle: The cavity size was normal. Systolic function was   mildly to moderately reduced. The estimated ejection fraction was   in the range of 40% to 45%. Diffuse  hypokinesis. There was a   reduced contribution of atrial contraction to ventricular   filling, due to increased ventricular diastolic pressure or   atrial contractile dysfunction. Doppler parameters are consistent   with a reversible restrictive pattern, indicative of decreased   left ventricular diastolic compliance and/or increased left   atrial pressure (grade 3 diastolic dysfunction). - Ventricular septum: Septal motion showed moderate paradox. These   changes are consistent with intraventricular conduction delay. - Left atrium: The atrium was mildly dilated. - Pulmonary arteries: PA peak pressure: 40 mm Hg (S).   Impressions:   - The right ventricular systolic pressure was increased consistent   with mild pulmonary hypertension.   Echo 05/31/18: Study Conclusions   - Left ventricle: Systolic function was severely reduced. The   estimated ejection fraction was in the range of 25% to 30%. - Mitral valve: There was mild regurgitation.   Echo 02/25/22: IMPRESSIONS     1. Left ventricular ejection fraction, by estimation, is 40 to 45%. The  left ventricle has mildly decreased function. The left ventricle  demonstrates global hypokinesis. There is mild left ventricular  hypertrophy. Left ventricular diastolic parameters  are consistent with Grade I diastolic dysfunction (impaired relaxation).   2. Right ventricular systolic function is normal. The right ventricular  size is mildly enlarged.   3. The mitral valve is normal in structure. No evidence of mitral valve  regurgitation. No evidence of mitral stenosis.   4. The aortic valve is tricuspid. There is mild calcification of the  aortic valve. There is mild thickening of the aortic valve. Aortic valve  regurgitation is mild. Aortic valve sclerosis is present, with no evidence  of aortic valve stenosis.   5. The inferior vena cava is normal in size with greater than 50%  respiratory variability, suggesting right atrial pressure  of 3 mmHg.   Comparison(s): The left ventricular function has improved. Prior EF  25-30%.   ASSESSMENT:    No diagnosis found.    PLAN:  In order of problems listed above:    Chronic systolic CHF. NYHA functional class 1.  NICM:  Now on good medical therapy with Entresto, Toprol XL, Jardiance, and lasix.  HR is well controlled.  EF has improved from 25-30% to 40-45%/  LV dysfunction is multifactorial related to prior Etoh abuse, dyssynergy with LBBB, prior embolic event, Afib with uncontrolled rate. He is on optimal medical therapy now. Intolerant of aldactone due to hyperkalemia. Now back on lasix. We will repeat BMET today.  Chronic atrial fib/flutter: on eliquis '5mg'$  BID. CHA2DS2-Vasc score 2 (HTN, HF).  Rate is now well controlled.   Hypertension: controlled  Hyperlipidemia: at goal on statin therapy.    5.   Gout. No recurrent symptoms.   6.   ED - I really don't see a need for Imdur now. Will discontinue. He will then be safe to use Cialis.   Medication Adjustments/Labs and Tests Ordered: Current medicines are reviewed at length with the patient today.  Concerns regarding medicines are outlined above.  Medication changes, Labs and Tests ordered today are listed in the Patient Instructions below. There are no Patient Instructions on file for this visit.   Follow up 6 months.   Signed, Manuella Blackson [name], MD  08/19/2022 2:07  PM    [facility] [address], [place], [place]  [address] Phone: [phone]; Fax: [phone]

## 2022-08-23 ENCOUNTER — Ambulatory Visit: Payer: Medicare (Managed Care) | Admitting: Cardiology

## 2022-08-24 ENCOUNTER — Other Ambulatory Visit: Payer: Self-pay | Admitting: Cardiology

## 2022-08-26 DIAGNOSIS — N528 Other male erectile dysfunction: Secondary | ICD-10-CM | POA: Diagnosis not present

## 2022-08-26 DIAGNOSIS — Z8546 Personal history of malignant neoplasm of prostate: Secondary | ICD-10-CM | POA: Diagnosis not present

## 2022-08-31 ENCOUNTER — Other Ambulatory Visit: Payer: Self-pay | Admitting: Cardiology

## 2022-09-01 ENCOUNTER — Encounter: Payer: Self-pay | Admitting: *Deleted

## 2022-09-01 NOTE — Progress Notes (Signed)
[facility] Quality Team Note  Name: SAMVEL ZINN Date of Birth: [birth year] MRN: [id number] Date: 09/01/2022  [facility] Quality Team has reviewed this patient's chart, please see recommendations below:  [facility] Quality Other; (Pt has open gap for A1C.  Would need to have completed and within range before end of 2023 to close gap.)

## 2022-09-02 ENCOUNTER — Emergency Department (HOSPITAL_COMMUNITY)
Admission: EM | Admit: 2022-09-02 | Discharge: 2022-09-02 | Disposition: A | Discharge status: Home / Self Care | Payer: Medicare (Managed Care) | Attending: Emergency Medicine | Admitting: Emergency Medicine

## 2022-09-02 ENCOUNTER — Emergency Department (HOSPITAL_COMMUNITY): Payer: Medicare (Managed Care)

## 2022-09-02 ENCOUNTER — Encounter (HOSPITAL_COMMUNITY): Payer: Self-pay | Admitting: Emergency Medicine

## 2022-09-02 DIAGNOSIS — Z7901 Long term (current) use of anticoagulants: Secondary | ICD-10-CM | POA: Diagnosis not present

## 2022-09-02 DIAGNOSIS — I10 Essential (primary) hypertension: Secondary | ICD-10-CM | POA: Diagnosis not present

## 2022-09-02 DIAGNOSIS — Z91010 Allergy to peanuts: Secondary | ICD-10-CM | POA: Insufficient documentation

## 2022-09-02 DIAGNOSIS — W01198A Fall on same level from slipping, tripping and stumbling with subsequent striking against other object, initial encounter: Secondary | ICD-10-CM | POA: Insufficient documentation

## 2022-09-02 DIAGNOSIS — Z955 Presence of coronary angioplasty implant and graft: Secondary | ICD-10-CM | POA: Insufficient documentation

## 2022-09-02 DIAGNOSIS — Y9301 Activity, walking, marching and hiking: Secondary | ICD-10-CM | POA: Diagnosis not present

## 2022-09-02 DIAGNOSIS — Z8546 Personal history of malignant neoplasm of prostate: Secondary | ICD-10-CM | POA: Diagnosis not present

## 2022-09-02 DIAGNOSIS — Y92007 Garden or yard of unspecified non-institutional (private) residence as the place of occurrence of the external cause: Secondary | ICD-10-CM | POA: Diagnosis not present

## 2022-09-02 DIAGNOSIS — S01311A Laceration without foreign body of right ear, initial encounter: Secondary | ICD-10-CM | POA: Diagnosis not present

## 2022-09-02 DIAGNOSIS — I4891 Unspecified atrial fibrillation: Secondary | ICD-10-CM | POA: Diagnosis not present

## 2022-09-02 DIAGNOSIS — S0990XA Unspecified injury of head, initial encounter: Secondary | ICD-10-CM | POA: Diagnosis not present

## 2022-09-02 DIAGNOSIS — Z79899 Other long term (current) drug therapy: Secondary | ICD-10-CM | POA: Diagnosis not present

## 2022-09-02 DIAGNOSIS — S0991XA Unspecified injury of ear, initial encounter: Secondary | ICD-10-CM | POA: Diagnosis present

## 2022-09-02 MED ORDER — LIDOCAINE HCL (PF) 1 % IJ SOLN
5.0000 mL | Freq: Once | INTRAMUSCULAR | Status: AC
  Administered 2022-09-02: 5 mL via INTRADERMAL
  Filled 2022-09-02: qty 5

## 2022-09-02 MED ORDER — ACETAMINOPHEN 500 MG PO TABS
1000.0000 mg | ORAL_TABLET | Freq: Once | ORAL | Status: AC
  Administered 2022-09-02: 1000 mg via ORAL
  Filled 2022-09-02: qty 2

## 2022-09-02 NOTE — ED Notes (Incomplete)
..  Trauma Response Nurse Documentation   Dennis Lewis is a 66 y.o. male arriving to [facility] ED via POV  On Eliquis (apixaban) daily. Trauma was activated as a Level 2 by Regenia Skeeter based on the following trauma criteria Elderly patients > 65 with head trauma on anti-coagulation (excluding ASA). Trauma team at the bedside on patient arrival.   Patient cleared for CT by Dr. . Pt transported to {TRN Radiology:26861::"CT"} with trauma response nurse present to monitor. RN remained with the patient throughout their absence from the department for clinical observation.   GCS 15.  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/2016   GERD (gastroesophageal reflux disease)    H/O cardiovascular stress test 2010   [name]   H/O echocardiogram 2010    mild LVH with some septal dyssynergy, EF 45%, impaired relaxation   History of prostate cancer    Hyperlipidemia    Hypertension    Left bundle branch block    NSVT (nonsustained ventricular tachycardia) (HCC)    Palpitations    Prostate cancer ([place]) 04/02/14   Gleason 7, volume 30 gm   S/P radiation therapy 06/26/2014 through 08/22/2014                                                      Prostate 7800 cGy in 40 sessions                         Torn tendon    right shoulder     Past Surgical History:  Procedure Laterality Date   [place]   Normal cors   CARDIAC CATHETERIZATION N/A 06/06/2016   Procedure: Left Heart Cath and Coronary Angiography;  Surgeon: Jettie Booze, MD;  Location: [facility];  Service: Cardiovascular;  Laterality: N/A;   PROSTATE BIOPSY  04/02/14   Gleason 7, vol 30 gm       Initial Focused Assessment (If applicable, or please see trauma documentation): Laceration to top of ear, pt  intermittently holding pressure, bleeding controlled   CT's Completed:   {Trauma CT:26866}   Interventions:   Plan for disposition:  {Trauma Dispo:26867}   Consults completed:  {Trauma Consults:26862} at ***.  Event Summary:  Pt assessed by myself in the waiting area, pt reports fell striking plastic bucket on R ear, laceration noted, bleeding controlled. Pt taking Eliquis, last dose this am. Denies LOC, pt holding pressure on wound. Bleeding controlled at this time. Pt reports some discomfort to mastoid. No bruising noted.    Bedside handoff with ED RN [name].    Hitomi Slape, [place]  Trauma Response RN  Please call TRN at [phone] for further assistance.

## 2022-09-02 NOTE — ED Provider Notes (Signed)
[facility] EMERGENCY DEPARTMENT Provider Note   CSN: [id number] Arrival date & time: 09/02/22  1417     History  Chief Complaint  Patient presents with   Dennis Lewis is a 66 y.o. male with a past medical history of A-fib, blood clot on Eliquis, CHF last echo in 12/2021 left EF 40-45%, GERD, prostate cancer presenting to the emergency department for evaluation after a fall.  Patient states that he was walking when he tripped over a water hose and hit his head to a tabletop about 2 hours ago.Marland Kitchen  He suffered a laceration to the right ear.  He was able to get up and ambulate right after the fall.  Bleeding controlled.  No loss of consciousness, nausea or vomiting.  Denies pain elsewhere.  He reports short episode of palpitation when that happened.  Denies headache, dizziness, chest pain, shortness of breath.   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   GERD (gastroesophageal reflux disease)    H/O cardiovascular stress test 2010   [name]   H/O echocardiogram 2010    mild LVH with some septal dyssynergy, EF 45%, impaired relaxation   History of prostate cancer    Hyperlipidemia    Hypertension    Left bundle branch block    NSVT (nonsustained ventricular tachycardia) (HCC)    Palpitations    Prostate cancer ([place]) 04/02/14   Gleason 7, volume 30 gm   S/P radiation therapy 06/26/2014 through 08/22/2014                                                      Prostate 7800 cGy in 40 sessions                         Torn tendon    right shoulder   Past Surgical History:  Procedure Laterality Date   [place]   Normal cors   CARDIAC CATHETERIZATION N/A 06/06/2016   Procedure: Left Heart Cath and Coronary Angiography;  Surgeon:  Jettie Booze, MD;  Location: [place] [facility];  Service: Cardiovascular;  Laterality: N/A;   PROSTATE BIOPSY  04/02/14   Gleason 7, vol 30 gm     Home Medications Prior to Admission medications   Medication Sig Start Date End Date Taking? Authorizing Provider  apixaban (ELIQUIS) 5 MG TABS tablet Take 1 tablet (5 mg total) by mouth 2 (two) times daily. 02/25/22   [name], [name], MD  Colchicine (MITIGARE) 0.6 MG CAPS TAKE ONE CAPSULE BY MOUTH TWO TIMES A DAY FOR ONE WEEK THEN TAKE ONE CAPSULE BY MOUTH DAILY 08/06/21   [name], [name], MD  colchicine 0.6 MG tablet Take 1 tablet (0.6 mg total) by mouth daily. 12/18/18   [name], [name], MD  empagliflozin (JARDIANCE) 10 MG TABS tablet Take 1 tablet (10 mg total) by mouth daily before breakfast. 02/25/22   [name], [name], MD  furosemide (LASIX) 20 MG tablet Take 2 tablets by mouth once daily 06/25/22  [name], [name], MD  loratadine (CLARITIN) 10 MG tablet Take 1 tablet (10 mg total) by mouth daily. 12/10/20   Lorrene Reid, PA-C  methocarbamol (ROBAXIN) 500 MG tablet methocarbamol 500 mg tablet  TAKE 1 TABLET BY MOUTH THREE TIMES DAILY AS NEEDED FOR 10 DAYS    [provider]  metoprolol succinate (TOPROL-XL) 100 MG 24 hr tablet TAKE 1 TABLET BY MOUTH IN THE MORNING 08/25/22   [name], [name], MD  metoprolol succinate (TOPROL-XL) 50 MG 24 hr tablet TAKE 1 TABLET BY MOUTH IN THE EVENING WITH FOOD OR  IMMEDIATELY  FOLLOWING  A  MEAL 09/07/21   [name], [name], MD  nitroGLYCERIN (NITROSTAT) 0.4 MG SL tablet Place 1 tablet under the tongue every 5 (five) minutes as needed for chest pain. X 3 doses Patient not taking: Reported on 06/21/2022 07/05/16   [provider]  rosuvastatin (CRESTOR) 40 MG tablet TAKE 1 TABLET BY MOUTH DAILY 06/29/22   Lelon Perla, MD  sacubitril-valsartan [facility]) [phone] MG Take 1 tablet by mouth twice daily 08/17/22   [name], [name], MD  valACYclovir (VALTREX) 1000 MG tablet Take 1 tablet by mouth  three times daily as needed Patient not taking: Reported on 06/21/2022 09/30/21   Lorrene Reid, PA-C      Allergies    Chocolate flavor, Ace inhibitors, Chocolate, Other, Peanut-containing drug products, Tramadol, and Hydralazine    Review of Systems   Review of Systems Negative except as per HPI.  Physical Exam Updated Vital Signs BP (!) 140/77   Pulse 77   Temp 97.9 F (36.6 C)   Resp 16   Ht '5\' 9"'$  (1.753 m)   Wt 87.5 kg   SpO2 99%   BMI 28.50 kg/m  Physical Exam Vitals and nursing note reviewed.  Constitutional:      Appearance: Normal appearance.  HENT:     Head: Normocephalic and atraumatic.     Mouth/Throat:     Mouth: Mucous membranes are moist.  Eyes:     General: No scleral icterus. Cardiovascular:     Rate and Rhythm: Normal rate and regular rhythm.     Pulses: Normal pulses.     Heart sounds: Normal heart sounds.  Pulmonary:     Effort: Pulmonary effort is normal.     Breath sounds: Normal breath sounds.  Abdominal:     General: Abdomen is flat.     Palpations: Abdomen is soft.     Tenderness: There is no abdominal tenderness.  Musculoskeletal:        General: No deformity.  Skin:    General: Skin is warm.     Findings: No rash.     Comments: 1 cm laceration on outer edge of R ear.  Neurological:     General: No focal deficit present.     Mental Status: He is alert.  Psychiatric:        Mood and Affect: Mood normal.     ED Results / Procedures / Treatments   Labs (all labs ordered are listed, but only abnormal results are displayed) Labs Reviewed - No data to display  EKG None  Radiology CT Head Wo Contrast  Result Date: 09/02/2022 CLINICAL DATA:  Trauma EXAM: CT HEAD WITHOUT CONTRAST TECHNIQUE: Contiguous axial images were obtained from the base of the skull through the vertex without intravenous contrast. RADIATION DOSE REDUCTION: This exam was performed according to the departmental dose-optimization program which includes automated  exposure control, adjustment of the mA and/or  kV according to patient size and/or use of iterative reconstruction technique. COMPARISON:  None Available. FINDINGS: Brain: No evidence of acute infarction, hemorrhage, hydrocephalus, extra-axial collection or mass lesion/mass effect. Vascular: No hyperdense vessel or unexpected calcification. Skull: Normal. Negative for fracture or focal lesion. Sinuses/Orbits: Mucosal thickening bilateral maxillary, bilateral ethmoid, left sphenoid sinus. Other: None. IMPRESSION: No acute intracranial pathology. Electronically Signed   By: Marin Roberts M.D.   On: 09/02/2022 15:24    Procedures .Marland KitchenLaceration Repair  Date/Time: 09/02/2022 11:57 PM  Performed by: Rex Kras, PA Authorized by: Rex Kras, PA   Consent:    Consent obtained:  Verbal   Consent given by:  Patient   Risks, benefits, and alternatives were discussed: yes     Risks discussed:  Infection and pain Universal protocol:    Procedure explained and questions answered to patient or proxy's satisfaction: yes     Relevant documents present and verified: yes     Patient identity confirmed:  Verbally with patient and arm band Anesthesia:    Anesthesia method:  Local infiltration   Local anesthetic:  Lidocaine 1% w/o epi Laceration details:    Location: R ear.   Length (cm):  1   Depth (mm):  2 Pre-procedure details:    Preparation:  Patient was prepped and draped in usual sterile fashion Exploration:    Limited defect created (wound extended): no     Hemostasis achieved with:  Direct pressure   Imaging outcome: foreign body not noted     Contaminated: no   Treatment:    Area cleansed with:  Chlorhexidine   Amount of cleaning:  Extensive   Irrigation solution:  Sterile saline   Irrigation volume:  50   Irrigation method:  Syringe and pressure wash   Visualized foreign bodies/material removed: no     Debridement:  None   Undermining:  None Skin repair:    Repair method:  Sutures   Suture  size:  4-0   Suture material:  Nylon   Suture technique:  Simple interrupted   Number of sutures:  3 Approximation:    Approximation:  Close Repair type:    Repair type:  Simple Post-procedure details:    Dressing:  Antibiotic ointment   Procedure completion:  Tolerated well, no immediate complications     Medications Ordered in ED Medications  lidocaine (PF) (XYLOCAINE) 1 % injection 5 mL (5 mLs Intradermal Given 09/02/22 1534)  acetaminophen (TYLENOL) tablet 1,000 mg (1,000 mg Oral Given 09/02/22 1535)    ED Course/ Medical Decision Making/ A&P                           Medical Decision Making Amount and/or Complexity of Data Reviewed Radiology: ordered.  Risk OTC drugs. Prescription drug management.   This patient presents to the ED for evaluation after fall, this involves an extensive number of treatment options, and is a complaint that carries with a high risk of complications and morbidity.  The differential diagnosis includes ICH, skull fracture, ear laceration.  This is not an exhaustive list.  Comorbidities that complicate the patient evaluation See HPI  Social determinants of health NA  Additional history obtained: External records from outside source obtained and reviewed including: Chart review including previous notes, labs, imaging.  Cardiac monitoring/EKG: The patient was maintained on a cardiac monitor.  I personally reviewed and interpreted the cardiac monitor which showed an underlying rhythm of: Sinus rhythm.  Lab tests:  Imaging studies: I ordered imaging studies. I personally reviewed, interpreted imaging and agree with the radiologist's interpretations. Findings include: CT head showed no evidence of intracranial hemorrhage.  Problem list/ ED course/ Critical interventions/ Medical management: HPI: See above Vital signs significant for blood pressure 140/77.  Otherwise within normal range and stable throughout visit. Laboratory/imaging  studies significant for: See above. On physical examination, patient is afebrile and appears in no acute distress.  Heart sounds normal.  Lungs are clear no wheezes or rales.  Neuro exam was unremarkable.  No evidence of focal neurological deficit.  Patient denies LOC, nausea, vomiting.  CT head showed no evidence of intracranial hemorrhage.  There is a 1 cm curved laceration to the outer border of the right knee.  Bleeding is controlled.  See laceration repair procedure as above.  Based on patient's clinical presentations and laboratory/imaging studies I suspect laceration to right ear.  Tylenol ordered for pain. Advised patient to keep the wound clean, dry, changing every day.  Advised patient to return to primary care physician or urgent care for suture removal after 5 to 7 days.  Return to ER if new or worsening symptoms. I have reviewed the patient home medicines and have made adjustments as needed.  Consultations obtained: I requested consultation with Dr. Maryan Rued, and discussed lab and imaging findings as well as pertinent plan.  He/she agrees with the plan.  Disposition Continued outpatient therapy. Follow-up with PCP recommended for reevaluation of symptoms. Treatment plan discussed with patient.  Pt acknowledged understanding was agreeable to the plan. Worrisome signs and symptoms were discussed with patient, and patient acknowledged understanding to return to the ED if they noticed these signs and symptoms. Patient was stable upon discharge.   This chart was dictated using voice recognition software.  Despite best efforts to proofread,  errors can occur which can change the documentation meaning.          Final Clinical Impression(s) / ED Diagnoses Final diagnoses:  Ear lobe laceration, right, initial encounter    Rx / DC Orders ED Discharge Orders     None         Rex Kras, [place] 09/03/22 0000    Blanchie Dessert, MD 09/03/22 1537

## 2022-09-02 NOTE — ED Triage Notes (Signed)
Pt reports @ 30-45 min ago was walking in the yard, tripped over water hose and fell onto R side, pt reports he struck something causing laceration to ear. Pt reports pain 6/10, on ear and behind ear. Pt denies LOC. A & 0, PUPILS equal.

## 2022-09-02 NOTE — ED Notes (Signed)
PA and MD x 2 consulted regarding trauma criteria, decision made to call Level 2 trauma.

## 2022-09-02 NOTE — Discharge Instructions (Signed)
Please take tylenol/ibuprofen for pain. I recommend close follow-up with PCP for reevaluation.  Please do not hesitate to return to emergency department if worrisome signs symptoms we discussed become apparent.

## 2022-09-07 ENCOUNTER — Other Ambulatory Visit: Payer: Self-pay | Admitting: Cardiology

## 2022-09-18 ENCOUNTER — Other Ambulatory Visit: Payer: Self-pay | Admitting: Cardiology

## 2022-09-19 NOTE — Progress Notes (Unsigned)
Cardiology Office Note    Date:  09/19/2022   ID:  Dennis Lewis, DOB 05/10/1956, MRN [id number]  PCP:  Dennis, Lessly Lewis M, MD  Cardiologist:  Dr. Camillia Marcy Dennis    History of Present Illness:  Dennis Lewis is a 66 y.o. male seen for follow up CHF. He has a PMH of hypertension, GERD, hyperlipidemia, chronic LBBB, history of NSVT, prostate cancer s/p radiation therapy, persistent atrial flutter, and NICM with EF 35-40%. He has a history of Etoh abuse.  He was admitted for NSTEMI on 06/06/2016, he underwent emergent cardiac catheterization which only showed 30% proximal RCA disease, 25-35% ejection fraction, pattern of apical wall motion abnormality.  Surprisingly, his troponin peaked at 31.36 which is a lot higher than expected for stress-induced cardiomyopathy. Retrospectively, it is possible that he had an embolic MI.  Imdur and hydralazine were added. Initial echo obtained in September showed ejection fraction 25-30%. He was also noted to have new atrial fibrillation, due to elevated CHA2DS2-Vasc score, he was started on eliquis. Repeat echocardiogram 6 weeks later on 07/20/2016 showed ejection fraction has improved to 65-99%, grade 3 diastolic dysfunction, paradoxical ventricular septal motion consistent with LBBB.   He was seen in May 2019  for preoperative clearance prior to right elbow and upper arm procedure. Repeat Echo was ordered but rescheduled multiple times and finally performed in September 2019. This demonstrated reduced LV function with EF 25-30% .  His persistent atrial fib/flutter  rate was controlled. He was started on Entresto and dose titrated by Pharm D. He was admitted overnight with CHF exacerbation on 09/16/18. Lasix had been recently reduced due to gout. Afib rate was elevated.  He was seen in January 2020 and spironolactone was increased. Since then we have worked on optimizing medical therapy  He did have follow up Echo showing improvement in EF to 40-45%. On his visit in June  we recommended using lasix only PRN. We also stopped aldactone due to hyperkalemia. He presented to the ED on 9/12 with increased CHF. CXR showed pulmonary edema. BNP was 777. Was diuresed and DC back on lasix.   Since resuming his lasix he has no further difficulty. Denies any SOB or edema. No chest pain. Is following a healthy diet and walking 3.5 miles/day. He does note some ED and wants to try taking Cialis.     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   GERD (gastroesophageal reflux disease)    H/O cardiovascular stress test 2010   [name]   H/O echocardiogram 2010    mild LVH with some septal dyssynergy, EF 45%, impaired relaxation   History of prostate cancer    Hyperlipidemia    Hypertension    Left bundle branch block    NSVT (nonsustained ventricular tachycardia) (HCC)    Palpitations    Prostate cancer ([place]) 04/02/14   Gleason 7, volume 30 gm   S/P radiation therapy 06/26/2014 through 08/22/2014                                                      Prostate 7800 cGy in 40 sessions  Torn tendon    right shoulder    Past Surgical History:  Procedure Laterality Date   [place]   Normal cors   CARDIAC CATHETERIZATION N/A 06/06/2016   Procedure: Left Heart Cath and Coronary Angiography;  Surgeon: Jettie Booze, MD;  Location: [facility];  Service: Cardiovascular;  Laterality: N/A;   PROSTATE BIOPSY  04/02/14   Gleason 7, vol 30 gm    Current Medications: Allergies as of 09/23/2022       Reactions   Chocolate Flavor Swelling   Ace Inhibitors Other (See Comments)   Cold sores   Chocolate Other (See Comments)   Blisters   Other Other (See Comments)   Seed on bread causes lip blisters Anesthesia also, but does nor  remember which one   Peanut-containing Drug Products Other (See Comments)   Blisters   Tramadol    Hydralazine Palpitations   Irregular heartbeat, fluttering        Medication List        Accurate as of September 19, 2022  7:48 AM. If you have any questions, ask your nurse or doctor.          apixaban 5 MG Tabs tablet Commonly known as: Eliquis Take 1 tablet (5 mg total) by mouth 2 (two) times daily.   colchicine 0.6 MG tablet Take 1 tablet (0.6 mg total) by mouth daily.   Colchicine 0.6 MG Caps Commonly known as: Mitigare TAKE ONE CAPSULE BY MOUTH TWO TIMES A DAY FOR ONE WEEK THEN TAKE ONE CAPSULE BY MOUTH DAILY   empagliflozin 10 MG Tabs tablet Commonly known as: Jardiance Take 1 tablet (10 mg total) by mouth daily before breakfast.   Entresto 97-103 MG Generic drug: sacubitril-valsartan Take 1 tablet by mouth twice daily   furosemide 20 MG tablet Commonly known as: LASIX Take 2 tablets by mouth once daily   loratadine 10 MG tablet Commonly known as: CLARITIN Take 1 tablet (10 mg total) by mouth daily.   methocarbamol 500 MG tablet Commonly known as: ROBAXIN methocarbamol 500 mg tablet  TAKE 1 TABLET BY MOUTH THREE TIMES DAILY AS NEEDED FOR 10 DAYS   metoprolol succinate 100 MG 24 hr tablet Commonly known as: TOPROL-XL TAKE 1 TABLET BY MOUTH IN THE MORNING   metoprolol succinate 50 MG 24 hr tablet Commonly known as: TOPROL-XL TAKE 1 TABLET BY MOUTH ONCE DAILY IN THE EVENING WITH MEALS OR  IMMEDIATELY  FOLLOWING  A  MEAL   nitroGLYCERIN 0.4 MG SL tablet Commonly known as: NITROSTAT Place 1 tablet under the tongue every 5 (five) minutes as needed for chest pain. X 3 doses   rosuvastatin 40 MG tablet Commonly known as: CRESTOR TAKE 1 TABLET BY MOUTH DAILY   valACYclovir 1000 MG tablet Commonly known as: VALTREX Take 1 tablet by mouth three times daily as needed        Allergies:   Chocolate flavor, Ace inhibitors, Chocolate, Other,  Peanut-containing drug products, Tramadol, and Hydralazine   Social History   Socioeconomic History   Marital status: Married    Spouse name: Not on file   Number of children: 2   Years of education: Not on file   Highest education level: Not on file  Occupational History   Occupation: Nurse, adult  Tobacco Use   Smoking status: Former    Types: Cigarettes    Quit date: 02/22/1979    Years since quitting: 43.6   Smokeless  tobacco: Never  Vaping Use   Vaping Use: Never used  Substance and Sexual Activity   Alcohol use: Yes    Alcohol/week: 28.0 standard drinks of alcohol    Types: 28 Cans of beer per week    Comment: beer daily - 4 daily   Drug use: No   Sexual activity: Not Currently  Other Topics Concern   Not on file  Social History Narrative   Not on file   Social Determinants of Health   Financial Resource Strain: Not on file  Food Insecurity: Not on file  Transportation Needs: Not on file  Physical Activity: Not on file  Stress: Not on file  Social Connections: Not on file     Family History:  The patient's family history includes Esophageal cancer in his father; Prostate cancer in his brother; Throat cancer (age of onset: 65) in his father.   ROS:   Please see the history of present illness.    ROSd All other systems reviewed and are negative.   PHYSICAL EXAM:   VS:  There were no vitals taken for this visit.  GENERAL:  Well appearing WM in NAD HEENT:  PERRL, EOMI, sclera are clear. Oropharynx is clear. NECK:  No jugular venous distention, carotid upstroke brisk and symmetric, no bruits, no thyromegaly or adenopathy LUNGS:  Clear to auscultation bilaterally CHEST:  Unremarkable HEART:  IRRR,  PMI not displaced or sustained,S1 and S2 within normal limits, no S3, no S4: no clicks, no rubs, no murmurs ABD:  Soft, nontender. BS +, no masses or bruits. No hepatomegaly, no splenomegaly EXT:  2 + pulses throughout, no edema, no cyanosis no clubbing SKIN:   Warm and dry.  No rashes NEURO:  Alert and oriented x 3. Cranial nerves II through XII intact. PSYCH:  Cognitively intact    Wt Readings from Last 3 Encounters:  09/02/22 193 lb (87.5 kg)  06/21/22 193 lb 6.4 oz (87.7 kg)  06/01/22 195 lb 1.7 oz (88.5 kg)      Studies/Labs Reviewed:   EKG:  EKG is not  ordered today.      Recent Labs: 06/01/2022: ALT 40; B Natriuretic Peptide 773.2; Hemoglobin 15.5; Platelets 219 06/21/2022: BUN 27; Creatinine, Ser 1.02; Potassium 4.9; Sodium 140   Lipid Panel    Component Value Date/Time   CHOL 144 02/25/2022 1630   TRIG 182 (H) 02/25/2022 1630   HDL 49 02/25/2022 1630   CHOLHDL 2.9 02/25/2022 1630   LDLCALC 65 02/25/2022 1630    Additional studies/ records that were reviewed today include:  Labs dated 02/16/17: cholesterol 255, triglycerides 291, HDL 44, LDL 153. Chemistries, CBC, TSH normal.   Cath 06/06/2016 Conclusion     Prox RCA lesion, 30 %stenosed at a bend. The left ventricular ejection fraction is 25-35% by visual estimate. The pattern of apical wall motion abnormality without severe CAD is suggestive of Takotsubo cardiomyopathy. LV end diastolic pressure is moderately elevated. There is no aortic valve stenosis.   Continue with aggressive medical therapy. He is allergic to ace inhibitors. Will add isosorbide to his hydralazine. Continue beta blocker. 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 He states that he was lying in bed for about 3 hours very worried about what he would do. He then started to have chest discomfort. The history is also consistent with Takatsubo cardiomyopathy.  May need diuresis given moderately elevated LVEDP.      Echo 07/20/2016 LV EF: 40% -  45%   Study Conclusions   - Left ventricle: The cavity size was normal. Systolic function was   mildly to moderately reduced. The estimated ejection fraction was   in the range of 40% to  45%. Diffuse hypokinesis. There was a   reduced contribution of atrial contraction to ventricular   filling, due to increased ventricular diastolic pressure or   atrial contractile dysfunction. Doppler parameters are consistent   with a reversible restrictive pattern, indicative of decreased   left ventricular diastolic compliance and/or increased left   atrial pressure (grade 3 diastolic dysfunction). - Ventricular septum: Septal motion showed moderate paradox. These   changes are consistent with intraventricular conduction delay. - Left atrium: The atrium was mildly dilated. - Pulmonary arteries: PA peak pressure: 40 mm Hg (S).   Impressions:   - The right ventricular systolic pressure was increased consistent   with mild pulmonary hypertension.   Echo 05/31/18: Study Conclusions   - Left ventricle: Systolic function was severely reduced. The   estimated ejection fraction was in the range of 25% to 30%. - Mitral valve: There was mild regurgitation.   Echo 02/25/22: IMPRESSIONS     1. Left ventricular ejection fraction, by estimation, is 40 to 45%. The  left ventricle has mildly decreased function. The left ventricle  demonstrates global hypokinesis. There is mild left ventricular  hypertrophy. Left ventricular diastolic parameters  are consistent with Grade I diastolic dysfunction (impaired relaxation).   2. Right ventricular systolic function is normal. The right ventricular  size is mildly enlarged.   3. The mitral valve is normal in structure. No evidence of mitral valve  regurgitation. No evidence of mitral stenosis.   4. The aortic valve is tricuspid. There is mild calcification of the  aortic valve. There is mild thickening of the aortic valve. Aortic valve  regurgitation is mild. Aortic valve sclerosis is present, with no evidence  of aortic valve stenosis.   5. The inferior vena cava is normal in size with greater than 50%  respiratory variability, suggesting right  atrial pressure of 3 mmHg.   Comparison(s): The left ventricular function has improved. Prior EF  25-30%.   ASSESSMENT:    No diagnosis found.    PLAN:  In order of problems listed above:    Chronic systolic CHF. NYHA functional class 1.  NICM:  Now on good medical therapy with Entresto, Toprol XL, Jardiance, and lasix.  HR is well controlled.  EF has improved from 25-30% to 40-45%/  LV dysfunction is multifactorial related to prior Etoh abuse, dyssynergy with LBBB, prior embolic event, Afib with uncontrolled rate. He is on optimal medical therapy now. Intolerant of aldactone due to hyperkalemia. Now back on lasix. We will repeat BMET today.  Chronic atrial fib/flutter: on eliquis '5mg'$  BID. CHA2DS2-Vasc score 2 (HTN, HF).  Rate is now well controlled.   Hypertension: controlled  Hyperlipidemia: at goal on statin therapy.    5.   Gout. No recurrent symptoms.   6.   ED - I really don't see a need for Imdur now. Will discontinue. He will then be safe to use Cialis.   Medication Adjustments/Labs and Tests Ordered: Current medicines are reviewed at length with the patient today.  Concerns regarding medicines are outlined above.  Medication changes, Labs and Tests ordered today are listed in the Patient Instructions below. There are no Patient Instructions on file for this visit.   Follow up 6 months.   Signed, Abhishek Levesque [name], MD  09/19/2022 7:48  AM    [facility] [address], [place], [place]  [address] Phone: [phone]; Fax: [phone]

## 2022-09-21 ENCOUNTER — Other Ambulatory Visit: Payer: Self-pay

## 2022-09-21 MED ORDER — ROSUVASTATIN CALCIUM 40 MG PO TABS: 40.0000 mg | ORAL_TABLET | Freq: Every day | ORAL | 0 refills | Status: DC

## 2022-09-21 NOTE — Telephone Encounter (Signed)
Prescription refill request for Eliquis received.  Indication:afib  Last office visit: [name] 06/21/2022 Scr: 1.02, 06/21/2022 Age: 67 Weight: 87.5kg   Refill sent.

## 2022-09-23 ENCOUNTER — Encounter: Payer: Self-pay | Admitting: Cardiology

## 2022-09-23 ENCOUNTER — Ambulatory Visit: Payer: Medicare (Managed Care) | Attending: Cardiology | Admitting: Cardiology

## 2022-09-23 VITALS — BP 140/70 | HR 81 | Ht 69.0 in | Wt 193.4 lb

## 2022-09-23 DIAGNOSIS — I5022 Chronic systolic (congestive) heart failure: Secondary | ICD-10-CM

## 2022-09-23 DIAGNOSIS — H538 Other visual disturbances: Secondary | ICD-10-CM | POA: Diagnosis not present

## 2022-09-23 DIAGNOSIS — I447 Left bundle-branch block, unspecified: Secondary | ICD-10-CM | POA: Diagnosis not present

## 2022-09-23 DIAGNOSIS — H2513 Age-related nuclear cataract, bilateral: Secondary | ICD-10-CM | POA: Diagnosis not present

## 2022-09-23 DIAGNOSIS — I4821 Permanent atrial fibrillation: Secondary | ICD-10-CM

## 2022-09-23 DIAGNOSIS — I10 Essential (primary) hypertension: Secondary | ICD-10-CM | POA: Diagnosis not present

## 2022-09-23 DIAGNOSIS — S0512XA Contusion of eyeball and orbital tissues, left eye, initial encounter: Secondary | ICD-10-CM | POA: Diagnosis not present

## 2022-09-23 MED ORDER — APIXABAN 5 MG PO TABS: 5.0000 mg | ORAL_TABLET | Freq: Two times a day (BID) | ORAL | 1 refills | Status: DC

## 2022-11-10 ENCOUNTER — Telehealth: Payer: Self-pay

## 2022-11-10 ENCOUNTER — Telehealth: Payer: Self-pay | Admitting: Cardiology

## 2022-11-10 NOTE — Telephone Encounter (Signed)
Patient called to talk with Dr. [name] or nursep

## 2022-11-10 NOTE — Telephone Encounter (Signed)
Called patient, LVM to call back to discuss.  Left call back number.   

## 2022-11-10 NOTE — Telephone Encounter (Signed)
Pt is requesting a call back in regards to med questions last seen 3/22 but scheduled an appt for tomorrow 11/11/21

## 2022-11-10 NOTE — Telephone Encounter (Signed)
Patient states that he has been experiencing dry itching throat and watery eyes. Also states that symptoms improved after taking Allegra. I ensured patient that those concerns will be addressed during next OV. All questions and concerns have been addressed.

## 2022-11-11 ENCOUNTER — Ambulatory Visit (INDEPENDENT_AMBULATORY_CARE_PROVIDER_SITE_OTHER): Payer: Medicare HMO | Admitting: Family Medicine

## 2022-11-11 ENCOUNTER — Encounter: Payer: Self-pay | Admitting: Family Medicine

## 2022-11-11 ENCOUNTER — Telehealth: Payer: Self-pay | Admitting: *Deleted

## 2022-11-11 VITALS — BP 122/77 | HR 65 | Resp 18 | Ht 71.0 in | Wt 187.0 lb

## 2022-11-11 DIAGNOSIS — K219 Gastro-esophageal reflux disease without esophagitis: Secondary | ICD-10-CM

## 2022-11-11 DIAGNOSIS — J3089 Other allergic rhinitis: Secondary | ICD-10-CM | POA: Diagnosis not present

## 2022-11-11 MED ORDER — OMEPRAZOLE 20 MG PO CPDR: 20.0000 mg | DELAYED_RELEASE_CAPSULE | Freq: Every day | ORAL | 3 refills | Status: DC

## 2022-11-11 NOTE — Progress Notes (Signed)
Acute Office Visit  Subjective:     Patient ID: Dennis Lewis, male    DOB: 05-02-1956, 67 y.o.   MRN: [id number]  Chief Complaint  Patient presents with   Medical Management of Chronic Issues   Hypertension    HPI Patient is in today for follow-up of hypertension and new cough/tickle in his throat. Hypertension: Patient here for follow-up of elevated blood pressure.  Pt denies chest pain, SOB, dizziness, edema, syncope, fatigue or heart palpitations. Taking Lasix, metoprolol, Entresto, reports excellent compliance with treatment. Denies side effects.  Patient states that he does have seasonal allergies which have been really acting up for the past 2 weeks.  He started taking Allegra initially which helped some, then he started taking Mucinex.  A few days after starting Mucinex the tickle in his throat also began.  This was all after he ate a meal when he went out to eat that had barbecue sauce and hot sauce and has not part of his typical diet.  He noticed that this did bother his stomach and was causing some reflux and indigestion.  He has a history of GERD.  Review of Systems  Constitutional:  Negative for fever.  HENT:  Negative for congestion.   Eyes:  Negative for blurred vision and double vision.  Respiratory:  Positive for cough (Dry). Negative for shortness of breath.   Cardiovascular:  Negative for chest pain, palpitations and leg swelling.  Gastrointestinal:  Positive for heartburn.  Neurological:  Negative for headaches.  Endo/Heme/Allergies:  Positive for environmental allergies. Negative for polydipsia.  Psychiatric/Behavioral:  Negative for depression. The patient is not nervous/anxious.       Objective:    BP 122/77 (BP Location: Left Arm, Patient Position: Sitting, Cuff Size: Normal)   Pulse 65   Resp 18   Ht 5' 11"$  (1.803 m)   Wt 187 lb (84.8 kg)   SpO2 99%   BMI 26.08 kg/m   Physical Exam Constitutional:      General: He is not in acute distress.     Appearance: Normal appearance.  HENT:     Head: Normocephalic and atraumatic.     Nose: Nose normal.     Right Turbinates: Swollen.     Left Turbinates: Swollen.     Mouth/Throat:     Pharynx: Oropharynx is clear.  Cardiovascular:     Rate and Rhythm: Normal rate and regular rhythm.     Pulses: Normal pulses.     Heart sounds: Normal heart sounds.  Pulmonary:     Effort: Pulmonary effort is normal.     Breath sounds: Normal breath sounds.  Skin:    General: Skin is warm and dry.  Neurological:     Mental Status: He is alert and oriented to person, place, and time.  Psychiatric:        Mood and Affect: Mood normal.      Assessment & Plan:  Environmental and seasonal allergies Assessment & Plan: Continue Allegra.  He agreed to discontinue Mucinex as there does seem to be some correlation between the timing of starting it and the start of the tickle in his throat.   Gastroesophageal reflux disease, unspecified whether esophagitis present Assessment & Plan: We discussed that sometimes flares of GERD can cause the sensation of having a tickle in the back of your throat.  In the past he took Nexium but he has not taken it in quite some time.  After remembering the meal that did bother  him prior to the tickle in his throat and subsequent cough starting, he does wonder if this has contributed.  We discussed starting omeprazole to decrease acid production in his stomach to alleviate the reflux and hopefully alleviate symptoms.  He agrees to a trial of this.  Orders: -     Omeprazole; Take 1 capsule (20 mg total) by mouth daily.  Dispense: 30 capsule; Refill: 3   Return if symptoms worsen or fail to improve.  Velva Harman, PA

## 2022-11-11 NOTE — Patient Instructions (Signed)
Continue Allegra. Stop taking the Mucinex. Start taking omeprazole. This will be instead of the Nexium that you were previously taking since they work in similar ways. Let me know if your symptoms have not improved after about 1 week.

## 2022-11-11 NOTE — Telephone Encounter (Signed)
Pt calling, stated he had visit today and wanted to see if provider could send in something stronger than nexium or prilosec. Please advise.

## 2022-11-11 NOTE — Assessment & Plan Note (Signed)
Continue Allegra.  He agreed to discontinue Mucinex as there does seem to be some correlation between the timing of starting it and the start of the tickle in his throat.

## 2022-11-11 NOTE — Telephone Encounter (Signed)
Pt informed of below.    Velva Harman, PA  You18 minutes ago (4:43 PM)    He can take the Prilosec that I sent in twice a day if he would like to see if that alleviates symptoms.  I would like him to try this for 5 to 7 days to see if it is effective and then let me know if he still needs something stronger.

## 2022-11-11 NOTE — Assessment & Plan Note (Signed)
We discussed that sometimes flares of GERD can cause the sensation of having a tickle in the back of your throat.  In the past he took Nexium but he has not taken it in quite some time.  After remembering the meal that did bother him prior to the tickle in his throat and subsequent cough starting, he does wonder if this has contributed.  We discussed starting omeprazole to decrease acid production in his stomach to alleviate the reflux and hopefully alleviate symptoms.  He agrees to a trial of this.

## 2022-11-15 NOTE — Telephone Encounter (Signed)
Left message for pt to call.

## 2022-11-17 ENCOUNTER — Telehealth: Payer: Self-pay

## 2022-11-17 NOTE — Telephone Encounter (Signed)
   Patient Name: BRODRICK FIELDHOUSE  DOB: 1956/03/08 MRN: [id number]  Primary Cardiologist: Peter [name], MD  Chart reviewed as part of pre-operative protocol coverage. Cataract extractions are recognized in guidelines as low risk surgeries that do not typically require specific preoperative testing or holding of blood thinner therapy. Therefore, given past medical history and time since last visit, based on ACC/AHA guidelines, MARKEEM WENDLER would be at acceptable risk for the planned procedure without further cardiovascular testing.   Pt was seen in Jan 2024 with optimized GDMT for HFrEF. Last echo with EF improved to 40-45%.   I will route this recommendation to the requesting party via Epic fax function and remove from pre-op pool.  Please call with questions.  Leake, PA 11/17/2022, 4:01 PM

## 2022-11-17 NOTE — Telephone Encounter (Signed)
   Pre-operative Risk Assessment    Patient Name: Dennis Lewis  DOB: September 08, 1956 MRN: [id number]     Request for Surgical Clearance    Procedure:  Cataract extraction   Date of Surgery:  Clearance TBD                                 Surgeon:  Dr. Ty Hilts Group or Practice Name:  [facility]  Phone number:  [phone] Fax number:  [phone]   Type of Clearance Requested:   - Medical    Type of Anesthesia:  MAC   Additional requests/questions:    SignedMendel Ryder   11/17/2022, 12:19 PM

## 2022-11-25 ENCOUNTER — Telehealth: Payer: Self-pay

## 2022-11-25 ENCOUNTER — Other Ambulatory Visit: Payer: Self-pay | Admitting: Family Medicine

## 2022-11-25 DIAGNOSIS — K219 Gastro-esophageal reflux disease without esophagitis: Secondary | ICD-10-CM

## 2022-11-25 NOTE — Telephone Encounter (Signed)
LVM requesting return phone call.

## 2022-11-25 NOTE — Telephone Encounter (Signed)
Pt left a VM asking for a returned call from Providers nurse in regards to medications.  Pt left the number [phone].   Please advise

## 2022-11-25 NOTE — Telephone Encounter (Signed)
Patient states he took the Omeprazole for 2 weeks and it was ineffective so he went back to taking Nexium which improved some but not by much. Patient request something be ordered that is stronger or more effective.  Please advise.

## 2022-11-26 MED ORDER — ESOMEPRAZOLE MAGNESIUM 20 MG PO CPDR: 20.0000 mg | DELAYED_RELEASE_CAPSULE | Freq: Two times a day (BID) | ORAL | 1 refills | Status: DC

## 2022-11-26 NOTE — Telephone Encounter (Signed)
I informed patient of prescription and treatment recommendations. Patient states he also purchased Famotidine OTC. I instructed patient to take Nexium for 4 wks to see if that is enough to manage his GERD symptoms. Patient verbalized understanding. All questions and concerns addressed.

## 2022-11-26 NOTE — Addendum Note (Signed)
Addended by: Virgil Benedict on: 11/26/2022 10:54 AM   Modules accepted: Orders

## 2022-11-26 NOTE — Telephone Encounter (Signed)
Attempted to call patient, left message for patient to call back to office.   Call back number left on VM

## 2022-12-09 ENCOUNTER — Other Ambulatory Visit: Payer: Self-pay | Admitting: *Deleted

## 2022-12-09 MED ORDER — ROSUVASTATIN CALCIUM 40 MG PO TABS: 40.0000 mg | ORAL_TABLET | Freq: Every day | ORAL | 2 refills | Status: DC

## 2022-12-24 ENCOUNTER — Telehealth: Payer: Self-pay | Admitting: Cardiology

## 2022-12-24 NOTE — Telephone Encounter (Signed)
New Message:    Plt said his doctor told him to call. He said he said he needs something to lower  his Uric Acid.

## 2022-12-24 NOTE — Telephone Encounter (Signed)
Returned call to patient no answer.Unable to leave a message voicemail full. °

## 2022-12-24 NOTE — Telephone Encounter (Signed)
Called patient spoke to wife.She stated Dennis Lewis saw [facility] this morning.He was told he has gout in left elbow.No uric acid was done.Advised to have uric acid done with PCP.

## 2022-12-28 ENCOUNTER — Encounter: Payer: Self-pay | Admitting: Cardiology

## 2023-01-12 ENCOUNTER — Telehealth: Payer: Self-pay

## 2023-01-12 NOTE — Telephone Encounter (Signed)
Called patient to schedule Medicare Annual Wellness Visit (AWV). Unable to reach patient.  Last date of AWV: eligible 10/21/20 for AWV-I  Please schedule an appointment at any time  On Annual Wellness Visit Schedule.

## 2023-01-19 ENCOUNTER — Other Ambulatory Visit: Payer: Medicare HMO

## 2023-01-19 DIAGNOSIS — K219 Gastro-esophageal reflux disease without esophagitis: Secondary | ICD-10-CM

## 2023-01-20 LAB — URIC ACID: Uric Acid: 6.6 mg/dL (ref 3.8–8.4)

## 2023-01-27 ENCOUNTER — Encounter: Payer: Self-pay | Admitting: Physician Assistant

## 2023-01-27 ENCOUNTER — Ambulatory Visit: Payer: Medicare HMO | Admitting: Physician Assistant

## 2023-01-27 VITALS — BP 104/62 | HR 68 | Wt 178.0 lb

## 2023-01-27 DIAGNOSIS — K219 Gastro-esophageal reflux disease without esophagitis: Secondary | ICD-10-CM | POA: Diagnosis not present

## 2023-01-27 MED ORDER — ESOMEPRAZOLE MAGNESIUM 20 MG PO CPDR
20.0000 mg | DELAYED_RELEASE_CAPSULE | Freq: Two times a day (BID) | ORAL | 3 refills | Status: DC
Start: 2023-01-27 — End: 2023-12-13

## 2023-01-27 NOTE — Progress Notes (Signed)
Chief Complaint: GERD  HPI:    Dennis Lewis is a 67 year old male, previously known to Dr. Christella Hartigan, with a past medical history as listed below including GERD and A-fib on Eliquis (02/25/2022 echo with LVEF 40-45%), previously known to Dr. Christella Hartigan, who was referred to me by Melida Quitter, PA for a complaint of GERD.      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  Repeat recommended in 10 years.    12/08/2022 patient seen by ENT for burning sensation in his throat and throat clearing.  At that time he had a history of reflux and was taking twice daily Nexium.  He was eating a lot of tomato based foods.  Specially before going to bed.  At that time diagnosed with laryngeal pharyngeal reflux and continued on a daily PPI.    Today, patient tells me he has chronic reflux for which he uses Nexium 20 mg daily, he does well as long as he takes his medicine daily otherwise he will have some breakthrough symptoms.  Most recently tells me that in August he got in the habit of not eating dinner at all and eating a bowl of raisin bran around 9:00 at night and going straight to bed.  He then noticed an increase in burning in his chest and reflux type symptoms for which he was seen by his PCP and ENT as above and was told it was reflux.  His PCP initially increased his Nexium to 20 mg twice daily.  Patient then realized he had changed his eating habits and stopped eating cereal so late in the night about a month ago and he has had no problems anymore.  He has continued on Esomeprazole 20 twice daily but wonders if he needs to stay on this.  No further issues per him.    Denies fever, chills, weight loss, blood in the stool, nausea or vomiting.  Past Medical History:  Diagnosis Date   A-fib (HCC)    Anxiety    Blood clot in vein 2017   Cardiomyopathy due to hypertension, without heart failure (HCC)    Congestive dilated cardiomyopathy (HCC)  04/23/2016   GERD (gastroesophageal reflux disease)    H/O cardiovascular stress test 2010   [name]   H/O echocardiogram 2010    mild LVH with some septal dyssynergy, EF 45%, impaired relaxation   History of prostate cancer    Hyperlipidemia    Hypertension    Left bundle branch block    NSVT (nonsustained ventricular tachycardia) (HCC)    Palpitations    Prostate cancer (HCC) 04/02/14   Gleason 7, volume 30 gm   S/P radiation therapy 06/26/2014 through 08/22/2014                                                      Prostate 7800 cGy in 40 sessions                         Torn tendon    right shoulder    Past Surgical History:  Procedure Laterality Date   BACK SURGERY  1993   CARDIAC CATHETERIZATION  1990   Normal cors   CARDIAC CATHETERIZATION N/A 06/06/2016   Procedure: Left Heart Cath and Coronary Angiography;  Surgeon: Corky Crafts, MD;  [facility] INVASIVE CV LAB;  Service: Cardiovascular;  Laterality: N/A;   PROSTATE BIOPSY  04/02/14   Gleason 7, vol 30 gm    Current Outpatient Medications  Medication Sig Dispense Refill   apixaban (ELIQUIS) 5 MG TABS tablet Take 1 tablet (5 mg total) by mouth 2 (two) times daily. 180 tablet 1   empagliflozin (JARDIANCE) 10 MG TABS tablet Take 1 tablet (10 mg total) by mouth daily before breakfast. 90 tablet 3   esomeprazole (NEXIUM) 20 MG capsule Take 1 capsule (20 mg total) by mouth 2 (two) times daily at 8 am and 10 pm. Take first dose before breakfast. After 4 weeks, start tapering back down to 20 mg before breakfast.  You can start by taking your usual dose twice a day every other day for about a week and reduce the frequency from there. 60 capsule 1   furosemide (LASIX) 20 MG tablet Take 2 tablets by mouth once daily 180 tablet 3   metoprolol succinate (TOPROL-XL) 100 MG 24 hr tablet TAKE 1 TABLET BY MOUTH IN THE MORNING 90 tablet 3   metoprolol succinate (TOPROL-XL) 50 MG 24 hr tablet TAKE 1 TABLET BY MOUTH ONCE DAILY  IN THE EVENING WITH MEALS OR  IMMEDIATELY  FOLLOWING  A  MEAL 90 tablet 3   rosuvastatin (CRESTOR) 40 MG tablet Take 1 tablet (40 mg total) by mouth daily. 90 tablet 2   sacubitril-valsartan (ENTRESTO) 97-103 MG Take 1 tablet by mouth twice daily 180 tablet 3   No current facility-administered medications for this visit.    Allergies as of 01/27/2023 - Review Complete 01/27/2023  Allergen Reaction Noted   Chocolate flavor Swelling 09/19/2019   Ace inhibitors Other (See Comments) 07/19/2013   Chocolate Other (See Comments) 06/06/2016   Other Other (See Comments) 06/06/2016   Peanut-containing drug products Other (See Comments) 06/06/2016   Hydralazine Palpitations 06/06/2016    Family History  Problem Relation Age of Onset   Throat cancer Father 41       smoker   Esophageal cancer Father    Prostate cancer Brother        surgery   Colon cancer Neg Hx    Diabetes Neg Hx    Colon polyps Neg Hx     Social History   Socioeconomic History   Marital status: Married    Spouse name: Not on file   Number of children: 2   Years of education: Not on file   Highest education level: Not on file  Occupational History   Occupation: Copywriter, advertising  Tobacco Use   Smoking status: Former    Types: Cigarettes    Quit date: 02/22/1979    Years since quitting: 43.9    Passive exposure: Never   Smokeless tobacco: Never  Vaping Use   Vaping Use: Never used  Substance and Sexual Activity   Alcohol use: Yes    Alcohol/week: 28.0 standard drinks of alcohol    Types: 28 Cans of beer per week    Comment: beer daily - 4 daily   Drug use: No   Sexual activity: Not Currently  Other Topics Concern   Not on file  Social History Narrative   Not on file   Social Determinants of Health   Financial Resource Strain: Not on file  Food Insecurity: Not on file  Transportation Needs: Not on file  Physical Activity: Not on file  Stress: Not on file  Social Connections: Not on  file  Intimate  Partner Violence: Not on file    Review of Systems:    Constitutional: No weight loss, fever or chills Skin: No rash  Cardiovascular: No chest pain  Respiratory: No SOB Gastrointestinal: See HPI and otherwise negative Genitourinary: No dysuria Neurological: No headache, dizziness or syncope Musculoskeletal: No new muscle or joint pain Hematologic: No bleeding  Psychiatric: No history of depression or anxiety   Physical Exam:  Vital signs: BP 104/62 (BP Location: Left Arm, Patient Position: Sitting, Cuff Size: Normal)   Pulse 68 Comment: irregular  Wt 178 lb (80.7 kg)   BMI 24.83 kg/m    Constitutional:   Pleasant Caucasian male appears to be in NAD, Well developed, Well nourished, alert and cooperative Head:  Normocephalic and atraumatic. Eyes:   PEERL, EOMI. No icterus. Conjunctiva pink. Ears:  Normal auditory acuity. Neck:  Supple Throat: Oral cavity and pharynx without inflammation, swelling or lesion.  Respiratory: Respirations even and unlabored. Lungs clear to auscultation bilaterally.   No wheezes, crackles, or rhonchi.  Cardiovascular: Normal S1, S2. No MRG. Regular rate and rhythm. No peripheral edema, cyanosis or pallor.  Gastrointestinal:  Soft, nondistended, nontender. No rebound or guarding. Normal bowel sounds. No appreciable masses or hepatomegaly. Rectal:  Not performed.  Msk:  Symmetrical without gross deformities. Without edema, no deformity or joint abnormality.  Neurologic:  Alert and  oriented x4;  grossly normal neurologically.  Skin:   Dry and intact without significant lesions or rashes. Psychiatric: Demonstrates good judgement and reason without abnormal affect or behaviors.  RELEVANT LABS AND IMAGING: CBC    Component Value Date/Time   WBC 6.1 06/01/2022 0445   RBC 4.79 06/01/2022 0445   HGB 15.5 06/01/2022 0445   HGB 15.1 02/25/2022 1630   HCT 47.5 06/01/2022 0445   HCT 45.0 02/25/2022 1630   PLT 219 06/01/2022 0445   PLT 189 02/25/2022 1630    MCV 99.2 06/01/2022 0445   MCV 95 02/25/2022 1630   MCH 32.4 06/01/2022 0445   MCHC 32.6 06/01/2022 0445   RDW 13.9 06/01/2022 0445   RDW 13.0 02/25/2022 1630   LYMPHSABS 1.6 06/01/2022 0445   LYMPHSABS 1.6 12/18/2020 1219   MONOABS 0.7 06/01/2022 0445   EOSABS 0.3 06/01/2022 0445   EOSABS 0.3 12/18/2020 1219   BASOSABS 0.1 06/01/2022 0445   BASOSABS 0.1 12/18/2020 1219    CMP     Component Value Date/Time   NA 140 06/21/2022 1426   K 4.9 06/21/2022 1426   CL 102 06/21/2022 1426   CO2 20 06/21/2022 1426   GLUCOSE 95 06/21/2022 1426   GLUCOSE 115 (H) 06/01/2022 0732   BUN 27 06/21/2022 1426   CREATININE 1.02 06/21/2022 1426   CALCIUM 9.3 06/21/2022 1426   PROT 7.6 06/01/2022 0732   PROT 7.6 02/25/2022 1630   ALBUMIN 4.0 06/01/2022 0732   ALBUMIN 4.6 02/25/2022 1630   AST 42 (H) 06/01/2022 0732   ALT 40 06/01/2022 0732   ALKPHOS 66 06/01/2022 0732   BILITOT 1.6 (H) 06/01/2022 0732   BILITOT 0.4 02/25/2022 1630   GFRNONAA >60 06/01/2022 0732   GFRAA 93 09/19/2019 0912    Assessment: 1.  GERD: Chronic for the patient with previous EGD in 2001 which was normal, recent increase in symptoms related to lifestyle, better now with lifestyle modifications 2.  Screening for colorectal cancer: Next colonoscopy due in November 2026  Plan: 1.  Patient due for his next colonoscopy 07/28/2025 for screening.  We discussed this today.  Patient did have an EGD back in 2001 at [facility] which was normal.  Pending symptoms at time of repeat colonoscopy to consider repeat EGD. 2.  For now decrease patient back to Nexium 20 mg every morning, prescribed #90 with 3 refills.  He should take this 30 minutes before breakfast. 3.  Discussed antireflux diet and lifestyle modifications. 4.  Patient to follow in clinic with [place] as needed. Chart sent to Dr. Barron Alvine today in lieu of Dr. Larae Grooms absence.  Hyacinth Meeker, PA-C [facility] 01/27/2023, 9:08 AM  Cc: Melida Quitter, PA

## 2023-01-27 NOTE — Patient Instructions (Addendum)
  We have sent the following medications to your pharmacy for you to pick up at your convenience: Nexium  _______________________________________________________  If your blood pressure at your visit was 140/90 or greater, please contact your primary care physician to follow up on this.  _______________________________________________________  If you are age 68 or older, your body mass index should be between 23-30. Your Body mass index is 24.83 kg/m. If this is out of the aforementioned range listed, please consider follow up with your Primary Care Provider.  If you are age 73 or younger, your body mass index should be between 19-25. Your Body mass index is 24.83 kg/m. If this is out of the aformentioned range listed, please consider follow up with your Primary Care Provider.   ________________________________________________________  The [facility] providers would like to encourage you to use [facility] to communicate with providers for non-urgent requests or questions.  Due to long hold times on the telephone, sending your provider a message by [facility] may be a faster and more efficient way to get a response.  Please allow 48 business hours for a response.  Please remember that this is for non-urgent requests.  _______________________________________________________ It was a pleasure to see you today!  Thank you for trusting me with your gastrointestinal care!

## 2023-02-10 ENCOUNTER — Telehealth: Payer: Self-pay

## 2023-02-10 NOTE — Telephone Encounter (Signed)
Unsuccessful attempt to reach patient on preferred number listed in notes for scheduled AWV. Left message on voicemail okay to reschedule. 

## 2023-02-16 NOTE — Progress Notes (Signed)
Agree with the assessment and plan as outlined by Jennifer Lemmon, PA-C. ? ?Aydien Majette, DO, FACG ? ?

## 2023-03-07 ENCOUNTER — Other Ambulatory Visit: Payer: Self-pay | Admitting: Cardiology

## 2023-03-27 NOTE — Progress Notes (Unsigned)
Office Visit    Patient Name: Dennis Lewis Date of Encounter: 03/28/2023  Primary Care Provider:  Melida Quitter, PA Primary Cardiologist:  Peter Swaziland, MD  Chief Complaint  67 year old male with past medical history of NICM with EF of 35-40%, hypertension, GERD, hyperlipidemia, chronic left bundle branch block, history of NSVT, prostate cancer s/p radiation therapy, persistent atrial flutter history of EtOH abuse.  Presents today for follow-up for CHF.  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   GERD (gastroesophageal reflux disease)    H/O cardiovascular stress test 2010   [name]   H/O echocardiogram 2010    mild LVH with some septal dyssynergy, EF 45%, impaired relaxation   History of prostate cancer    Hyperlipidemia    Hypertension    Left bundle branch block    NSVT (nonsustained ventricular tachycardia) (HCC)    Palpitations    Prostate cancer (HCC) 04/02/14   Gleason 7, volume 30 gm   S/P radiation therapy 06/26/2014 through 08/22/2014                                                      Prostate 7800 cGy in 40 sessions                         Torn tendon    right shoulder   Past Surgical History:  Procedure Laterality Date   BACK SURGERY  1993   CARDIAC CATHETERIZATION  1990   Normal cors   CARDIAC CATHETERIZATION N/A 06/06/2016   Procedure: Left Heart Cath and Coronary Angiography;  Surgeon: Corky Crafts, MD;  Location: [facility] INVASIVE CV LAB;  Service: Cardiovascular;  Laterality: N/A;   PROSTATE BIOPSY  04/02/14   Gleason 7, vol 30 gm   Allergies Allergies  Allergen Reactions   Chocolate Flavor Swelling   Ace Inhibitors Other (See Comments)    Cold sores    Chocolate Other (See Comments)    Blisters    Other Other (See Comments)    Seed on  bread causes lip blisters Anesthesia also, but does nor remember which one   Peanut-Containing Drug Products Other (See Comments)    Blisters   Hydralazine Palpitations    Irregular heartbeat, fluttering   Labs/Other Studies Reviewed    The following studies were reviewed today: Cardiac Studies & Procedures   CARDIAC CATHETERIZATION  CARDIAC CATHETERIZATION 06/06/2016  Narrative  Prox RCA lesion, 30 %stenosed at a bend.  The left ventricular ejection fraction is 25-35% by visual estimate. The pattern of apical wall motion abnormality without severe CAD is suggestive of Takotsubo cardiomyopathy.  LV end diastolic pressure is moderately elevated.  There is no aortic valve stenosis.  Continue with aggressive medical therapy. He is allergic to ace inhibitors. Will add isosorbide to his hydralazine. Continue beta blocker. 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 He states that he was lying in bed for about 3 hours very worried about what he would do. He then started to have chest discomfort. The history  is also consistent with Takatsubo cardiomyopathy.  May need diuresis given moderately elevated LVEDP.  Findings Coronary Findings Diagnostic  Dominance: Right  Right Coronary Artery  Intervention  No interventions have been documented.   STRESS TESTS  MYOCARDIAL PERFUSION IMAGING 09/18/2015  Narrative  Nuclear stress EF: 44%.  The left ventricular ejection fraction is moderately decreased (30-44%).  There was no ST segment deviation noted during stress.  Defect 1: There is a small defect of severe severity present in the basal inferior and mid inferior location. This is likely due to diaphragmatic attenuation, given that the wall motion is similar to the rest of the myocardium and the apical inferior wall is spared. Cannot rule out prior infarct.  This is a moderate risk study due to depressed LVEF. There  is no ischemia.  This is an intermediate risk study.   ECHOCARDIOGRAM  ECHOCARDIOGRAM COMPLETE 02/25/2022  Narrative ECHOCARDIOGRAM REPORT    Patient Name:   Dennis Lewis  Date of Exam: 02/25/2022 Medical Rec #:  [id number]     Height:       69.0 in Accession #:    [id number]    Weight:       187.0 lb Date of Birth:  [birth year]     BSA:          2.008 m Patient Age:    66 years      BP:           110/74 mmHg Patient Gender: M             HR:           74 bpm. Exam Location:  [address]  Procedure: 2D Echo, Cardiac Doppler and Color Doppler  Indications:    I42.0 Dilated Cardiomyopathy  History:        Patient has prior history of Echocardiogram examinations, most recent 12/18/2020. Dilated Cardiomyopathy, Arrythmias:Atrial Fibrillation, LBBB and Palpitations; Risk Factors:Hypertension and Dyslipidemia.  Sonographer:    Sedonia Small Rodgers-Jones RDCS Referring Phys: [phone] [name] Swaziland  IMPRESSIONS   1. Left ventricular ejection fraction, by estimation, is 40 to 45%. The left ventricle has mildly decreased function. The left ventricle demonstrates global hypokinesis. There is mild left ventricular hypertrophy. Left ventricular diastolic parameters are consistent with Grade I diastolic dysfunction (impaired relaxation). 2. Right ventricular systolic function is normal. The right ventricular size is mildly enlarged. 3. The mitral valve is normal in structure. No evidence of mitral valve regurgitation. No evidence of mitral stenosis. 4. The aortic valve is tricuspid. There is mild calcification of the aortic valve. There is mild thickening of the aortic valve. Aortic valve regurgitation is mild. Aortic valve sclerosis is present, with no evidence of aortic valve stenosis. 5. The inferior vena cava is normal in size with greater than 50% respiratory variability, suggesting right atrial pressure of 3 mmHg.  Comparison(s): The left ventricular function has improved. Prior EF  25-30%.  FINDINGS Left Ventricle: Left ventricular ejection fraction, by estimation, is 40 to 45%. The left ventricle has mildly decreased function. The left ventricle demonstrates global hypokinesis. The left ventricular internal cavity size was normal in size. There is mild left ventricular hypertrophy. Left ventricular diastolic parameters are consistent with Grade I diastolic dysfunction (impaired relaxation).  Right Ventricle: The right ventricular size is mildly enlarged. No increase in right ventricular wall thickness. Right ventricular systolic function is normal.  Left Atrium: Left atrial size was normal in size.  Right Atrium: Right atrial size was normal in size.  Pericardium: There is no evidence of pericardial effusion. Presence of epicardial fat layer.  Mitral Valve: The mitral valve is normal in structure. No evidence of mitral valve regurgitation. No evidence of mitral valve stenosis.  Tricuspid Valve: The tricuspid valve is normal in structure. Tricuspid valve regurgitation is mild . No evidence of tricuspid stenosis.  Aortic Valve: The aortic valve is tricuspid. There is mild calcification of the aortic valve. There is mild thickening of the aortic valve. Aortic valve regurgitation is mild. Aortic regurgitation PHT measures 687 msec. Aortic valve sclerosis is present, with no evidence of aortic valve stenosis.  Pulmonic Valve: The pulmonic valve was normal in structure. Pulmonic valve regurgitation is trivial. No evidence of pulmonic stenosis.  Aorta: The aortic root is normal in size and structure.  Venous: The inferior vena cava is normal in size with greater than 50% respiratory variability, suggesting right atrial pressure of 3 mmHg.  IAS/Shunts: No atrial level shunt detected by color flow Doppler.   LEFT VENTRICLE PLAX 2D LVIDd:         4.90 cm LVIDs:         3.20 cm LV PW:         1.20 cm LV IVS:        1.00 cm LVOT diam:     2.10 cm LV SV:         38 LV  SV Index:   19 LVOT Area:     3.46 cm   RIGHT VENTRICLE RV Basal diam:  4.10 cm RV S prime:     9.56 cm/s TAPSE (M-mode): 1.2 cm  LEFT ATRIUM             Index        RIGHT ATRIUM           Index LA diam:        4.50 cm 2.24 cm/m   RA Area:     10.70 cm LA Vol (A2C):   61.1 ml 30.43 ml/m  RA Volume:   23.00 ml  11.46 ml/m LA Vol (A4C):   64.1 ml 31.93 ml/m LA Biplane Vol: 63.6 ml 31.68 ml/m AORTIC VALVE LVOT Vmax:   60.75 cm/s LVOT Vmean:  46.450 cm/s LVOT VTI:    0.109 m AI PHT:      687 msec  AORTA Ao Root diam: 3.50 cm Ao Asc diam:  3.40 cm  MV E velocity: 65.47 cm/s  TRICUSPID VALVE TR Peak grad:   24.4 mmHg TR Vmax:        247.00 cm/s  SHUNTS Systemic VTI:  0.11 m Systemic Diam: 2.10 cm  Donato Schultz MD Electronically signed by Donato Schultz MD Signature Date/Time: 02/25/2022/11:38:54 AM    Final    MONITORS  CARDIAC EVENT MONITOR 08/21/2015  Narrative  Normal sinus rhythm  PVCs isolated  NSVT longest 11 beats  Symptoms of skipping do correlate with ectopy          Recent Labs: 06/01/2022: ALT 40; B Natriuretic Peptide 773.2; Hemoglobin 15.5; Platelets 219 06/21/2022: BUN 27; Creatinine, Ser 1.02; Potassium 4.9; Sodium 140  Recent Lipid Panel    Component Value Date/Time   CHOL 144 02/25/2022 1630   TRIG 182 (H) 02/25/2022 1630   HDL 49 02/25/2022 1630   CHOLHDL 2.9 02/25/2022 1630   LDLCALC 65 02/25/2022 1630   History of Present Illness  67 year old male with past medical history of NICM with EF of 25 to 30%, hypertension, GERD, hyperlipidemia, chronic left bundle branch  block, history of NSVT, prostate cancer s/p radiation therapy, persistent atrial flutter history of EtOH abuse.  06/06/2016 he was admitted for NSTEMI underwent emergent cardiac catheterization which showed 30% proximal RCA disease, 25 to 35% ejection fraction, pattern of apical wall motion abnormality consistent with Takotsubo cardiomyopathy.  Initially felt to be  stress-induced cardiomyopathy, but his troponin peaked at 31.363, questioned if result of an embolic MI.  He was started on Imdur and hydralazine.  Echocardiogram from September of 2017 showed EF 25 to 30%.  He was also noted to have new atrial fibrillation, started on Eliquis.  Repeat echocardiogram on 07/20/2016 showed EF improved to 40-45%, Grade III diastolic dysfunction, paradoxical ventricular septal motion consistent with left bundle branch block.  May 2019 he presented for preoperative clearance for right arm procedure.  Repeat echo was ordered, finally performed September 2019.  This demonstrated reduced LV function with EF 25 to 30%. Atrial fib/flutter was rate controlled.  He was started on Entresto.  09/16/2018 he was admitted with a CHF exacerbation.  Lasix had recently been reduced in setting of gout.  His A-fib rate was elevated at that time.  In January 2020 his spironolactone was increased.  His repeat echocardiogram on 12/18/2020 indicated an EF of 25 to 30%, the LV had severely decreased function, demonstrating global hypokinesis.  On 02/25/2022 he was seen in office for follow-up.  He had an echocardiogram at that time that showed improved EF to 40 to 45%, global hypokinesis, mild LV hypertrophy, GIDD, RV mildly enlarged, aortic valve sclerosis with no evidence of stenosis.  Spironolactone was discontinued at that time in setting of hyperkalemia.  He was encouraged to use Lasix only as needed.  On 9/12 he presented to the ED with CHF exacerbation.  BNP was 777 at that time, was diuresed and discharged on daily Lasix.  At last office visit on 09/23/2022 was noted to have been doing well from a cardiac perspective.  Denied any shortness of breath or edema, denied chest pain.  Today he presents for follow up.  He reports that he is doing extremely well, the best he has felt in a long time.  He denies chest pain, shortness of breath or lower extremity edema.  Notes he came in today at the  request of [facility] as he has lost 20 pounds since February and there was concern for possible hypotension on his current blood pressure regimen.  He reports his blood pressure at home has been well-controlled averaging 114/70's-120's/80's.  He denies any dizziness, lightheadedness, syncope or presyncope.  He has been working to intentionally lose weight, decreasing his daily and nighttime snacking, is consistent with a low-sodium diet.  He walks a few miles 3-5 times a week, tolerating well.   Home Medications    Current Outpatient Medications  Medication Sig Dispense Refill   apixaban (ELIQUIS) 5 MG TABS tablet Take 1 tablet (5 mg total) by mouth 2 (two) times daily. 180 tablet 1   colchicine 0.6 MG tablet Take 1 tablet by mouth as needed.     empagliflozin (JARDIANCE) 10 MG TABS tablet Take 1 tablet (10 mg total) by mouth daily before breakfast. 90 tablet 3   esomeprazole (NEXIUM) 20 MG capsule Take 1 capsule (20 mg total) by mouth 2 (two) times daily at 8 am and 10 pm. Take first dose before breakfast. After 4 weeks, start tapering back down to 20 mg before breakfast.  You can start by taking your usual dose twice a day every other  day for about a week and reduce the frequency from there. 90 capsule 3   furosemide (LASIX) 20 MG tablet Take 2 tablets by mouth once daily 180 tablet 3   HYDROcodone-acetaminophen (NORCO/VICODIN) 5-325 MG tablet Take 1 tablet by mouth as needed.     metoprolol succinate (TOPROL-XL) 100 MG 24 hr tablet TAKE 1 TABLET BY MOUTH IN THE MORNING 90 tablet 3   metoprolol succinate (TOPROL-XL) 50 MG 24 hr tablet TAKE 1 TABLET BY MOUTH ONCE DAILY IN THE EVENING WITH MEALS OR  IMMEDIATELY  FOLLOWING  A  MEAL 90 tablet 3   rosuvastatin (CRESTOR) 40 MG tablet Take 1 tablet by mouth once daily 90 tablet 1   sacubitril-valsartan (ENTRESTO) 97-103 MG Take 1 tablet by mouth twice daily 180 tablet 3   No current facility-administered medications for this visit.    Review of  Systems  He denies chest pain, palpitations, dyspnea, pnd, orthopnea, n, v, dizziness, syncope, edema, weight gain, or early satiety. All other systems reviewed and are otherwise negative except as noted above.     Physical Exam    VS:  BP 128/60   Pulse 72   Ht 5\' 9"  (1.753 m)   Wt 170 lb 9.6 oz (77.4 kg)   SpO2 99%   BMI 25.19 kg/m  , BMI Body mass index is 25.19 kg/m.     GEN: Well nourished, well developed, in no acute distress. HEENT: normal. Neck: Supple, no JVD, carotid bruits, or masses. Cardiac: Irregularly irregular, no murmurs, rubs, or gallops. No clubbing, cyanosis, edema.  Radials/DP/PT 2+ and equal bilaterally.  Respiratory:  Respirations regular and unlabored, clear to auscultation bilaterally. GI: Soft, nontender, nondistended, BS + x 4. MS: no deformity or atrophy. Skin: warm and dry, no rash. Neuro:  Strength and sensation are intact. Psych: Normal affect.  Accessory Clinical Findings    ECG personally reviewed by me today - EKG Interpretation Date/Time:  Monday March 28 2023 08:57:03 EDT Ventricular Rate:  72 PR Interval:    QRS Duration:  160 QT Interval:  458 QTC Calculation: 501 R Axis:   87  Text Interpretation: Atrial fibrillation Left bundle branch block Confirmed by Reather Littler [phone]) on 03/28/2023 9:09:16 AM  - no acute changes.   Lab Results  Component Value Date   WBC 6.1 06/01/2022   HGB 15.5 06/01/2022   HCT 47.5 06/01/2022   MCV 99.2 06/01/2022   PLT 219 06/01/2022   Lab Results  Component Value Date   CREATININE 1.02 06/21/2022   BUN 27 06/21/2022   NA 140 06/21/2022   K 4.9 06/21/2022   CL 102 06/21/2022   CO2 20 06/21/2022   Lab Results  Component Value Date   ALT 40 06/01/2022   AST 42 (H) 06/01/2022   ALKPHOS 66 06/01/2022   BILITOT 1.6 (H) 06/01/2022   Lab Results  Component Value Date   CHOL 144 02/25/2022   HDL 49 02/25/2022   LDLCALC 65 02/25/2022   TRIG 182 (H) 02/25/2022   CHOLHDL 2.9 02/25/2022    Lab  Results  Component Value Date   HGBA1C 5.6 09/19/2019   Assessment & Plan   Chronic systolic HF/NICM: 06/06/2016 he was admitted for NSTEMI underwent emergent cardiac catheterization which showed 30% proximal RCA disease, 25 to 35% ejection fraction, pattern of apical wall motion abnormality.  Echocardiogram on 02/2022 indicated EF to 40 to 45%, global hypokinesis, mild LV hypertrophy, GIDD, RV mildly enlarged, aortic valve sclerosis with no evidence of stenosis.  Reports he has been doing very well, denies any chest pain shortness of breath or lower extremity edema.  He is on max tolerated GDMT of Entresto, Toprol XL, Jardiance and Lasix. Spironolactone previously stopped in setting of hyperkalemia.  Remains consistent with low-sodium diet.  Continue Jardiance, Lasix, Toprol, Crestor and Entresto.  Chronic atrial fib/flutter/LBBB: Remains in rate controlled atrial fibrillation with stable left bundle branch block.  Denies feelings of palpitations or racing heart. CHA2DS2-VASc Score = 3 [CHF History: 1, HTN History: 1, Diabetes History: 0, Stroke History: 0, Vascular Disease History: 0, Age Score: 1, Gender Score: 0].  Therefore, the patient's annual risk of stroke is 3.2 %.  Check CBC and Cmet today. Continue Eliquis 5mg  twice daily and Toprol XL.   Hypertension:Blood pressure today 128/60. Notes home blood pressures average 114/70-120/80. [facility] noted concern for risk of hypotension as he has intentionally lost 20lbs since February. Denies any symptoms of hypotension, states he is feeling the best he has in years. He will monitor for signs of hypotension and notify office is such occurs. Continue Jardiance, Lasix, Toprol, Entresto   Hyperlipidemia: Last lipid panel on 02/25/22 indicated total cholesterol 144, triglycerides 182 and LDL 65. Check fasting lipid profile. Continue rosuvastatin 40mg  daily pending results of lipid profile.   Gout: Notes intermittent history of gout, last uric acid was 6.6  on 01/19/23. Denies pain today.   Disposition: Follow up scheduled with Dr. Swaziland on June 24, 2023.     Rip Harbour, NP 03/28/2023, 9:37 AM

## 2023-03-28 ENCOUNTER — Ambulatory Visit: Payer: Medicare HMO | Attending: General Practice | Admitting: Cardiology

## 2023-03-28 ENCOUNTER — Encounter: Payer: Self-pay | Admitting: General Practice

## 2023-03-28 VITALS — BP 128/60 | HR 72 | Ht 69.0 in | Wt 170.6 lb

## 2023-03-28 DIAGNOSIS — I5022 Chronic systolic (congestive) heart failure: Secondary | ICD-10-CM

## 2023-03-28 DIAGNOSIS — E785 Hyperlipidemia, unspecified: Secondary | ICD-10-CM

## 2023-03-28 DIAGNOSIS — I4821 Permanent atrial fibrillation: Secondary | ICD-10-CM | POA: Diagnosis not present

## 2023-03-28 DIAGNOSIS — I10 Essential (primary) hypertension: Secondary | ICD-10-CM

## 2023-03-28 DIAGNOSIS — I4891 Unspecified atrial fibrillation: Secondary | ICD-10-CM | POA: Diagnosis not present

## 2023-03-28 DIAGNOSIS — I420 Dilated cardiomyopathy: Secondary | ICD-10-CM

## 2023-03-28 DIAGNOSIS — Z79899 Other long term (current) drug therapy: Secondary | ICD-10-CM

## 2023-03-28 DIAGNOSIS — I447 Left bundle-branch block, unspecified: Secondary | ICD-10-CM

## 2023-03-28 NOTE — Patient Instructions (Signed)
Medication Instructions:  The current medical regimen is effective;  continue present plan and medications as directed. Please refer to the Current Medication list given to you today.  *If you need a refill on your cardiac medications before your next appointment, please call your pharmacy*  Lab Work: FASTING LIPID,MAG,CMET  If you have labs (blood work) drawn today and your tests are completely normal, you will receive your results only by:  MyChart Message (if you have MyChart) OR  A paper copy in the mail If you have any lab test that is abnormal or we need to change your treatment, we will call you to review the results.  Testing/Procedures: NONE  Follow-Up: At [facility], you and your health needs are our priority.  As part of our continuing mission to provide you with exceptional heart care, we have created designated Provider Care Teams.  These Care Teams include your primary Cardiologist (physician) and Advanced Practice Providers (APPs -  Physician Assistants and Nurse Practitioners) who all work together to provide you with the care you need, when you need it.  Your next appointment:   KEEP SCHEDULED   Provider:   Peter Swaziland, MD     Other Instructions

## 2023-04-01 ENCOUNTER — Telehealth: Payer: Self-pay

## 2023-04-01 LAB — CBC
Hematocrit: 44.6 % (ref 37.5–51.0)
Hemoglobin: 15.2 g/dL (ref 13.0–17.7)
MCH: 33 pg (ref 26.6–33.0)
MCHC: 34.1 g/dL (ref 31.5–35.7)
MCV: 97 fL (ref 79–97)
Platelets: 174 10*3/uL (ref 150–450)
RBC: 4.6 x10E6/uL (ref 4.14–5.80)
RDW: 16.6 % — ABNORMAL HIGH (ref 11.6–15.4)
WBC: 4.4 10*3/uL (ref 3.4–10.8)

## 2023-04-01 LAB — COMPREHENSIVE METABOLIC PANEL
ALT: 26 IU/L (ref 0–44)
AST: 28 IU/L (ref 0–40)
Albumin: 4.3 g/dL (ref 3.9–4.9)
Alkaline Phosphatase: 82 IU/L (ref 44–121)
BUN/Creatinine Ratio: 16 (ref 10–24)
BUN: 16 mg/dL (ref 8–27)
Bilirubin Total: 0.5 mg/dL (ref 0.0–1.2)
CO2: 25 mmol/L (ref 20–29)
Calcium: 9.9 mg/dL (ref 8.6–10.2)
Chloride: 103 mmol/L (ref 96–106)
Creatinine, Ser: 1 mg/dL (ref 0.76–1.27)
Globulin, Total: 2.3 g/dL (ref 1.5–4.5)
Glucose: 77 mg/dL (ref 70–99)
Potassium: 5.2 mmol/L (ref 3.5–5.2)
Sodium: 143 mmol/L (ref 134–144)
Total Protein: 6.6 g/dL (ref 6.0–8.5)
eGFR: 82 mL/min/{1.73_m2} (ref >59)

## 2023-04-01 LAB — MAGNESIUM: Magnesium: 2.4 mg/dL — ABNORMAL HIGH (ref 1.6–2.3)

## 2023-04-01 LAB — LIPID PANEL
Chol/HDL Ratio: 1.8 ratio (ref 0.0–5.0)
Cholesterol, Total: 144 mg/dL (ref 100–199)
HDL: 81 mg/dL (ref >39)
LDL Chol Calc (NIH): 45 mg/dL (ref 0–99)
Triglycerides: 100 mg/dL (ref 0–149)
VLDL Cholesterol Cal: 18 mg/dL (ref 5–40)

## 2023-04-01 NOTE — Telephone Encounter (Signed)
Patient aware of lab results and provider recommendations. He verbalized understanding. He was drinking a potassium supplement in the morning. He will cut that drink out as well

## 2023-04-01 NOTE — Telephone Encounter (Signed)
-----   Message from [facility] sent at 04/01/2023 12:28 PM EDT ----- Stable kidney function. Normal electrolytes, your potassium is on the high side of normal, do not take any potassium supplements and limit intake of electrolyte beverages. Stay well hydrated with water. CBC with no evidence of anemia nor infection.  Lipid profile looks good, LDL (bad cholesterol) of 45. Continue your current medications.

## 2023-04-12 ENCOUNTER — Other Ambulatory Visit: Payer: Self-pay | Admitting: Cardiology

## 2023-06-16 NOTE — Progress Notes (Signed)
Cardiology Office Note    Date:  06/24/2023   ID:  Dennis Lewis, DOB [birth year], MRN [id number]  PCP:  Melida Quitter, PA  Cardiologist:  Dr. Javanna Patin [place]    History of Present Illness:  Dennis Lewis is a 67 y.o. male seen for follow up CHF. He has a PMH of hypertension, GERD, hyperlipidemia, chronic LBBB, history of NSVT, prostate cancer s/p radiation therapy, persistent atrial flutter, and NICM with EF 35-40%. He has a history of Etoh abuse.  He was admitted for NSTEMI on 06/06/2016, he underwent emergent cardiac catheterization which only showed 30% proximal RCA disease, 25-35% ejection fraction, pattern of apical wall motion abnormality.  Surprisingly, his troponin peaked at 31.36 which is a lot higher than expected for stress-induced cardiomyopathy. Retrospectively, it is possible that he had an embolic MI.  Imdur and hydralazine were added. Initial echo obtained in September showed ejection fraction 25-30%. He was also noted to have new atrial fibrillation, due to elevated CHA2DS2-Vasc score, he was started on eliquis. Repeat echocardiogram 6 weeks later on 07/20/2016 showed ejection fraction has improved to 40-45%, grade 3 diastolic dysfunction, paradoxical ventricular septal motion consistent with LBBB.   He was seen in May 2019  for preoperative clearance prior to right elbow and upper arm procedure. Repeat Echo was ordered but rescheduled multiple times and finally performed in September 2019. This demonstrated reduced LV function with EF 25-30% .  His persistent atrial fib/flutter  rate was controlled. He was started on Entresto and dose titrated by Pharm D. He was admitted overnight with CHF exacerbation on 09/16/18. Lasix had been recently reduced due to gout. Afib rate was elevated.  He was seen in January 2020 and spironolactone was increased. Since then we have worked on optimizing medical therapy  He did have follow up Echo showing improvement in EF to 40-45%. On his visit in June  2023 we recommended using lasix only PRN. We also stopped aldactone due to hyperkalemia. He presented to the ED on 06/01/22 with increased CHF. CXR showed pulmonary edema. BNP was 777. Was diuresed and DC back on lasix. On subsequent follow up he has done well.    On follow up today he is doing very well. No swelling, dyspnea, chest pain or palpitations. Energy level is very good. States he can walk a long way without any problems. Still working.    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   GERD (gastroesophageal reflux disease)    H/O cardiovascular stress test 2010   [name]   H/O echocardiogram 2010    mild LVH with some septal dyssynergy, EF 45%, impaired relaxation   History of prostate cancer    Hyperlipidemia    Hypertension    Left bundle branch block    NSVT (nonsustained ventricular tachycardia) (HCC)    Palpitations    Prostate cancer (HCC) 04/02/14   Gleason 7, volume 30 gm   S/P radiation therapy 06/26/2014 through 08/22/2014                                                      Prostate 7800 cGy in 40  sessions                         Torn tendon    right shoulder    Past Surgical History:  Procedure Laterality Date   BACK SURGERY  1993   CARDIAC CATHETERIZATION  1990   Normal cors   CARDIAC CATHETERIZATION N/A 06/06/2016   Procedure: Left Heart Cath and Coronary Angiography;  Surgeon: Corky Crafts, MD;  Location: [facility] INVASIVE CV LAB;  Service: Cardiovascular;  Laterality: N/A;   PROSTATE BIOPSY  04/02/14   Gleason 7, vol 30 gm    Current Medications: Allergies as of 06/24/2023       Reactions   Chocolate Flavor Swelling   Ace Inhibitors Other (See Comments)   Cold sores   Chocolate Other (See Comments)   Blisters   Other Other (See Comments)   Seed on bread causes lip  blisters Anesthesia also, but does nor remember which one   Peanut-containing Drug Products Other (See Comments)   Blisters   Hydralazine Palpitations   Irregular heartbeat, fluttering        Medication List        Accurate as of June 24, 2023  2:47 PM. If you have any questions, ask your nurse or doctor.          apixaban 5 MG Tabs tablet Commonly known as: Eliquis Take 1 tablet (5 mg total) by mouth 2 (two) times daily.   colchicine 0.6 MG tablet Take 1 tablet by mouth as needed.   empagliflozin 10 MG Tabs tablet Commonly known as: Jardiance Take 1 tablet (10 mg total) by mouth daily before breakfast.   Entresto 97-103 MG Generic drug: sacubitril-valsartan Take 1 tablet by mouth twice daily   esomeprazole 20 MG capsule Commonly known as: NEXIUM Take 1 capsule (20 mg total) by mouth 2 (two) times daily at 8 am and 10 pm. Take first dose before breakfast. After 4 weeks, start tapering back down to 20 mg before breakfast.  You can start by taking your usual dose twice a day every other day for about a week and reduce the frequency from there.   furosemide 20 MG tablet Commonly known as: LASIX Take 2 tablets by mouth once daily   HYDROcodone-acetaminophen 5-325 MG tablet Commonly known as: NORCO/VICODIN Take 1 tablet by mouth as needed.   metoprolol succinate 100 MG 24 hr tablet Commonly known as: TOPROL-XL TAKE 1 TABLET BY MOUTH IN THE MORNING   metoprolol succinate 50 MG 24 hr tablet Commonly known as: TOPROL-XL TAKE 1 TABLET BY MOUTH ONCE DAILY IN THE EVENING WITH MEALS OR  IMMEDIATELY  FOLLOWING  A  MEAL   rosuvastatin 40 MG tablet Commonly known as: CRESTOR Take 1 tablet by mouth once daily        Allergies:   Chocolate flavor, Ace inhibitors, Chocolate, Other, Peanut-containing drug products, and Hydralazine   Social History   Socioeconomic History   Marital status: Married    Spouse name: Not on file   Number of children: 2   Years of  education: Not on file   Highest education level: Not on file  Occupational History   Occupation: Copywriter, advertising  Tobacco Use   Smoking status: Former    Current packs/day: 0.00    Types: Cigarettes    Quit date: 02/22/1979    Years since quitting: 44.3    Passive exposure: Never   Smokeless tobacco: Never  Vaping Use  Vaping status: Never Used  Substance and Sexual Activity   Alcohol use: Yes    Alcohol/week: 28.0 standard drinks of alcohol    Types: 28 Cans of beer per week    Comment: beer daily - 4 daily   Drug use: No   Sexual activity: Not Currently  Other Topics Concern   Not on file  Social History Narrative   Not on file   Social Determinants of Health   Financial Resource Strain: Not on file  Food Insecurity: Not on file  Transportation Needs: Not on file  Physical Activity: Not on file  Stress: Not on file  Social Connections: Not on file     Family History:  The patient's family history includes Esophageal cancer in his father; Prostate cancer in his brother; Throat cancer (age of onset: 25) in his father.   ROS:   Please see the history of present illness.    ROSd All other systems reviewed and are negative.   PHYSICAL EXAM:   VS:  BP 119/71 (BP Location: Left Arm, Patient Position: Sitting, Cuff Size: Normal)   Pulse 77   Ht 5\' 9"  (1.753 m)   Wt 165 lb 12.8 oz (75.2 kg)   SpO2 99%   BMI 24.48 kg/m   GENERAL:  Well appearing WM in NAD HEENT:  PERRL, EOMI, sclera are clear. Oropharynx is clear. NECK:  No jugular venous distention, carotid upstroke brisk and symmetric, no bruits, no thyromegaly or adenopathy LUNGS:  Clear to auscultation bilaterally CHEST:  Unremarkable HEART:  IRRR,  PMI not displaced or sustained,S1 and S2 within normal limits, no S3, no S4: no clicks, no rubs, no murmurs ABD:  Soft, nontender. BS +, no masses or bruits. No hepatomegaly, no splenomegaly EXT:  2 + pulses throughout, no edema, no cyanosis no clubbing SKIN:  Warm  and dry.  No rashes NEURO:  Alert and oriented x 3. Cranial nerves II through XII intact. PSYCH:  Cognitively intact    Wt Readings from Last 3 Encounters:  06/24/23 165 lb 12.8 oz (75.2 kg)  03/28/23 170 lb 9.6 oz (77.4 kg)  01/27/23 178 lb (80.7 kg)      Studies/Labs Reviewed:   EKG:  EKG is not  ordered today.      Recent Labs: 03/31/2023: ALT 26; BUN 16; Creatinine, Ser 1.00; Hemoglobin 15.2; Magnesium 2.4; Platelets 174; Potassium 5.2; Sodium 143   Lipid Panel    Component Value Date/Time   CHOL 144 03/31/2023 0901   TRIG 100 03/31/2023 0901   HDL 81 03/31/2023 0901   CHOLHDL 1.8 03/31/2023 0901   LDLCALC 45 03/31/2023 0901    Additional studies/ records that were reviewed today include:  Labs dated 02/16/17: cholesterol 255, triglycerides 291, HDL 44, LDL 153. Chemistries, CBC, TSH normal.   Cath 06/06/2016 Conclusion     Prox RCA lesion, 30 %stenosed at a bend. The left ventricular ejection fraction is 25-35% by visual estimate. The pattern of apical wall motion abnormality without severe CAD is suggestive of Takotsubo cardiomyopathy. LV end diastolic pressure is moderately elevated. There is no aortic valve stenosis.   Continue with aggressive medical therapy. He is allergic to ace inhibitors. Will add isosorbide to his hydralazine. Continue beta blocker. 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 He states that he was lying in bed for about 3 hours very worried about what he would do. He then started to have chest discomfort. The history is  also consistent with Takatsubo cardiomyopathy.  May need diuresis given moderately elevated LVEDP.      Echo 07/20/2016 LV EF: 40% -   45%   Study Conclusions   - Left ventricle: The cavity size was normal. Systolic function was   mildly to moderately reduced. The estimated ejection fraction was   in the range of 40% to 45%. Diffuse hypokinesis. There  was a   reduced contribution of atrial contraction to ventricular   filling, due to increased ventricular diastolic pressure or   atrial contractile dysfunction. Doppler parameters are consistent   with a reversible restrictive pattern, indicative of decreased   left ventricular diastolic compliance and/or increased left   atrial pressure (grade 3 diastolic dysfunction). - Ventricular septum: Septal motion showed moderate paradox. These   changes are consistent with intraventricular conduction delay. - Left atrium: The atrium was mildly dilated. - Pulmonary arteries: PA peak pressure: 40 mm Hg (S).   Impressions:   - The right ventricular systolic pressure was increased consistent   with mild pulmonary hypertension.   Echo 05/31/18: Study Conclusions   - Left ventricle: Systolic function was severely reduced. The   estimated ejection fraction was in the range of 25% to 30%. - Mitral valve: There was mild regurgitation.   Echo 02/25/22: IMPRESSIONS     1. Left ventricular ejection fraction, by estimation, is 40 to 45%. The  left ventricle has mildly decreased function. The left ventricle  demonstrates global hypokinesis. There is mild left ventricular  hypertrophy. Left ventricular diastolic parameters  are consistent with Grade I diastolic dysfunction (impaired relaxation).   2. Right ventricular systolic function is normal. The right ventricular  size is mildly enlarged.   3. The mitral valve is normal in structure. No evidence of mitral valve  regurgitation. No evidence of mitral stenosis.   4. The aortic valve is tricuspid. There is mild calcification of the  aortic valve. There is mild thickening of the aortic valve. Aortic valve  regurgitation is mild. Aortic valve sclerosis is present, with no evidence  of aortic valve stenosis.   5. The inferior vena cava is normal in size with greater than 50%  respiratory variability, suggesting right atrial pressure of 3 mmHg.    Comparison(s): The left ventricular function has improved. Prior EF  25-30%.   ASSESSMENT:    1. Chronic systolic CHF (congestive heart failure) (HCC)   2. Permanent atrial fibrillation (HCC)   3. Congestive dilated cardiomyopathy (HCC)   4. LBBB (left bundle branch block)       PLAN:  In order of problems listed above:    Chronic systolic CHF. NYHA functional class 1.  NICM:  Now on good medical therapy with Entresto, Toprol XL, Jardiance, and lasix.  HR is well controlled.  EF has improved from 25-30% to 40-45%. LV dysfunction is multifactorial related to prior Etoh abuse, dyssynergy with LBBB, prior embolic event, Afib. Intolerant of aldactone due to hyperkalemia. Now back on lasix. Continue low sodium diet.   Chronic atrial fib/flutter: on eliquis 5mg  BID. CHA2DS2-Vasc score 2 (HTN, HF).  Rate is now well controlled.   Hypertension:  well controlled.   Hyperlipidemia: at goal on statin therapy.    5.   Gout. No recurrent symptoms.   6.   LBBB chronic  Follow up in 6 months.  Medication Adjustments/Labs and Tests Ordered: Current medicines are reviewed at length with the patient today.  Concerns regarding medicines are outlined above.  Medication changes, Labs and Tests  ordered today are listed in the Patient Instructions below. There are no Patient Instructions on file for this visit.   Follow up 6 months.   Signed, Eero Dini [place], MD  06/24/2023 2:47 PM    [facility] [address] [place], [place], [place]  [address] Phone: [phone]; Fax: [phone]

## 2023-06-24 ENCOUNTER — Ambulatory Visit: Payer: Medicare HMO | Attending: Cardiology | Admitting: Cardiology

## 2023-06-24 ENCOUNTER — Encounter: Payer: Self-pay | Admitting: Cardiology

## 2023-06-24 VITALS — BP 119/71 | HR 77 | Ht 69.0 in | Wt 165.8 lb

## 2023-06-24 DIAGNOSIS — I420 Dilated cardiomyopathy: Secondary | ICD-10-CM | POA: Diagnosis not present

## 2023-06-24 DIAGNOSIS — I447 Left bundle-branch block, unspecified: Secondary | ICD-10-CM

## 2023-06-24 DIAGNOSIS — I4821 Permanent atrial fibrillation: Secondary | ICD-10-CM

## 2023-06-24 DIAGNOSIS — I5022 Chronic systolic (congestive) heart failure: Secondary | ICD-10-CM | POA: Diagnosis not present

## 2023-06-24 MED ORDER — EMPAGLIFLOZIN 10 MG PO TABS: 10.0000 mg | ORAL_TABLET | Freq: Every day | ORAL | 3 refills | Status: DC

## 2023-06-24 NOTE — Patient Instructions (Signed)
Medication Instructions:  Continue same medications *If you need a refill on your cardiac medications before your next appointment, please call your pharmacy*   Lab Work: None ordered   Testing/Procedures: None ordered   Follow-Up: At [facility], you and your health needs are our priority.  As part of our continuing mission to provide you with exceptional heart care, we have created designated Provider Care Teams.  These Care Teams include your primary Cardiologist (physician) and Advanced Practice Providers (APPs -  Physician Assistants and Nurse Practitioners) who all work together to provide you with the care you need, when you need it.  We recommend signing up for the patient portal called "MyChart".  Sign up information is provided on this After Visit Summary.  MyChart is used to connect with patients for Virtual Visits (Telemedicine).  Patients are able to view lab/test results, encounter notes, upcoming appointments, etc.  Non-urgent messages can be sent to your provider as well.   To learn more about what you can do with MyChart, go to [email or web address].    Your next appointment:  6 months    Call in Jan to schedule April appointment     Provider:  Dr.Jordan

## 2023-07-29 ENCOUNTER — Other Ambulatory Visit: Payer: Self-pay | Admitting: Cardiology

## 2023-08-06 ENCOUNTER — Encounter: Payer: Self-pay | Admitting: Cardiology

## 2023-08-24 ENCOUNTER — Other Ambulatory Visit: Payer: Self-pay | Admitting: Cardiology

## 2023-08-26 ENCOUNTER — Telehealth: Payer: Self-pay | Admitting: Cardiology

## 2023-08-26 NOTE — Telephone Encounter (Signed)
Pharmacy calling to get clarification for  metoprolol succinate (TOPROL-XL) 100 MG 24 hr tablet   metoprolol succinate (TOPROL-XL) 50 MG 24 hr tablet  Both sent in... which one is correct

## 2023-08-26 NOTE — Telephone Encounter (Signed)
Called and spoke to [facility]  Pharmacy states:   -received 2 prescriptions for Metoprolol Rx   -unsure which dose patient is taking  Informed pharmacy:   -Per chart review patient is taking 100mg  Toprolol in the morning and 50MG  Toprolol in evening   -Per chart Rx is same prescription and instructions as previous refills  Pharmacy has no further questions at this time

## 2023-08-31 ENCOUNTER — Other Ambulatory Visit: Payer: Self-pay | Admitting: Cardiology

## 2023-09-01 NOTE — Telephone Encounter (Signed)
Prescription refill request for Eliquis received. Indication: AF Last office visit: 06/24/23  P [place] MD Scr: 1.00 on 03/31/23  Epic Age: 67 Weight: 75.2kg  Based on above findings Eliquis 5mg  twice daily is the appropriate dose.  Refill approved.

## 2023-09-08 ENCOUNTER — Telehealth: Payer: Self-pay | Admitting: Cardiology

## 2023-09-08 ENCOUNTER — Other Ambulatory Visit: Payer: Self-pay | Admitting: Cardiology

## 2023-09-08 MED ORDER — FUROSEMIDE 20 MG PO TABS: ORAL_TABLET | ORAL | 2 refills | Status: DC

## 2023-09-08 NOTE — Telephone Encounter (Signed)
*  STAT* If patient is at the pharmacy, call can be transferred to refill team.   1. Which medications need to be refilled? (please list name of each medication and dose if known) furosemide (LASIX) 20 MG tablet   2. Which pharmacy/location (including street and city if local pharmacy) is medication to be sent to?  [facility] - [place], [place] - [address]    3. Do they need a 30 day or 90 day supply? 90

## 2023-09-22 ENCOUNTER — Ambulatory Visit (INDEPENDENT_AMBULATORY_CARE_PROVIDER_SITE_OTHER): Payer: HMO

## 2023-09-22 DIAGNOSIS — Z0000 Encounter for general adult medical examination without abnormal findings: Secondary | ICD-10-CM | POA: Diagnosis not present

## 2023-09-22 NOTE — Progress Notes (Signed)
 Subjective:   Dennis Lewis is a 68 y.o. male who presents for an Initial Medicare Annual Wellness Visit.  Visit Complete: Virtual I connected with  Dennis Lewis on 09/22/23 by a audio enabled telemedicine application and verified that I am speaking with the correct person using two identifiers. Visit was audio due to patient not having means to do it via video,  Patient Location: Home  Provider Location: Home Office  I discussed the limitations of evaluation and management by telemedicine. The patient expressed understanding and agreed to proceed.   Vital Signs: Because this visit was a virtual/telehealth visit, some criteria may be missing or patient reported. Any vitals not documented were not able to be obtained and vitals that have been documented are patient reported.    Cardiac Risk Factors include: advanced age (>51men, >54 women);dyslipidemia;hypertension;male gender     Objective:    Today's Vitals   There is no height or weight on file to calculate BMI.     09/22/2023    8:17 AM 06/01/2022    4:42 AM 06/07/2018    3:44 PM 06/06/2016    7:54 PM 07/29/2015    8:10 AM 07/15/2015    8:30 AM 05/14/2014    8:04 AM  Advanced Directives  Does Patient Have a Medical Advance Directive? No No No No No No No  Would patient like information on creating a medical advance directive?   No - Patient declined No - patient declined information No - patient declined information No - patient declined information No - patient declined information    Current Medications (verified) Outpatient Encounter Medications as of 09/22/2023  Medication Sig   apixaban  (ELIQUIS ) 5 MG TABS tablet Take 1 tablet by mouth twice daily   colchicine  0.6 MG tablet Take 1 tablet by mouth as needed.   empagliflozin  (JARDIANCE ) 10 MG TABS tablet Take 1 tablet (10 mg total) by mouth daily before breakfast.   furosemide  (LASIX ) 20 MG tablet TAKE 2 TABLETS BY MOUTH ONCE DAILY .   metoprolol  succinate (TOPROL -XL)  100 MG 24 hr tablet TAKE 1 TABLET BY MOUTH IN THE MORNING   metoprolol  succinate (TOPROL -XL) 50 MG 24 hr tablet TAKE 1 TABLET BY MOUTH ONCE DAILY IN THE EVENING WITH MEALS OR IMMEDIATELY FOLLOWING A MEAL   rosuvastatin  (CRESTOR ) 40 MG tablet Take 1 tablet by mouth once daily   sacubitril -valsartan  (ENTRESTO ) 97-103 MG Take 1 tablet by mouth twice daily   esomeprazole  (NEXIUM ) 20 MG capsule Take 1 capsule (20 mg total) by mouth 2 (two) times daily at 8 am and 10 pm. Take first dose before breakfast. After 4 weeks, start tapering back down to 20 mg before breakfast.  You can start by taking your usual dose twice a day every other day for about a week and reduce the frequency from there.   HYDROcodone -acetaminophen  (NORCO/VICODIN) 5-325 MG tablet Take 1 tablet by mouth as needed. (Patient not taking: Reported on 09/22/2023)   No facility-administered encounter medications on file as of 09/22/2023.    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   GERD (gastroesophageal reflux disease)    H/O cardiovascular stress test 2010   Adenosine  Myoview    H/O  echocardiogram 2010    mild LVH with some septal dyssynergy, EF 45%, impaired relaxation   History of prostate cancer    Hyperlipidemia    Hypertension    Left bundle branch block    NSVT (nonsustained ventricular tachycardia) (HCC)    Palpitations    Prostate cancer (HCC) 04/02/14   Gleason 7, volume 30 gm   S/P radiation therapy 06/26/2014 through 08/22/2014                                                      Prostate 7800 cGy in 40 sessions                         Torn tendon    right shoulder   Past Surgical History:   Procedure Laterality Date   BACK SURGERY  1993   CARDIAC CATHETERIZATION  1990   Normal cors   CARDIAC CATHETERIZATION N/A 06/06/2016   Procedure: Left Heart Cath and Coronary Angiography;  Surgeon: Candyce GORMAN Reek, MD;  Location: [facility] INVASIVE CV LAB;  Service: Cardiovascular;  Laterality: N/A;   PROSTATE BIOPSY  04/02/14   Gleason 7, vol 30 gm   Family History  Problem Relation Age of Onset   Throat cancer Father 34       smoker   Esophageal cancer Father    Prostate cancer Brother        surgery   Colon cancer Neg Hx    Diabetes Neg Hx    Colon polyps Neg Hx    Social History   Socioeconomic History   Marital status: Married    Spouse name: Not on file   Number of children: 2   Years of education: Not on file   Highest education level: Not on file  Occupational History   Occupation: Copywriter, Advertising  Tobacco Use   Smoking status: Former    Current packs/day: 0.00    Types: Cigarettes    Quit date: 02/22/1979    Years since quitting: 44.6    Passive exposure: Never   Smokeless tobacco: Never  Vaping Use   Vaping status: Never Used  Substance and Sexual Activity   Alcohol use: Yes    Alcohol/week: 28.0 standard drinks of alcohol    Types: 28 Cans of beer per week    Comment: beer daily - 4 daily   Drug use: No   Sexual activity: Not Currently  Other Topics Concern   Not on file  Social History Narrative   Not on file   Social Drivers of Health   Financial Resource Strain: Low Risk  (09/22/2023)   Overall Financial Resource Strain (CARDIA)    Difficulty of Paying Living Expenses: Not hard at all  Food Insecurity: No Food Insecurity (09/22/2023)   Hunger Vital Sign    Worried About Running Out of Food in the Last Year: Never true    Ran Out of Food in the Last Year: Never true  Transportation Needs: No Transportation Needs (09/22/2023)   PRAPARE - Administrator, Civil Service (Medical): No    Lack of Transportation (Non-Medical): No  Physical  Activity: Sufficiently Active (09/22/2023)   Exercise Vital Sign    Days of Exercise per Week: 4 days    Minutes of Exercise per Session: 60 min  Stress:  No Stress Concern Present (09/22/2023)   [employer] of Occupational Health - Occupational Stress Questionnaire    Feeling of Stress : Not at all  Social Connections: Moderately Isolated (09/22/2023)   Social Connection and Isolation Panel [NHANES]    Frequency of Communication with Friends and Family: More than three times a week    Frequency of Social Gatherings with Friends and Family: Once a week    Attends Religious Services: Never    Database Administrator or Organizations: No    Attends Engineer, Structural: Never    Marital Status: Married    Tobacco Counseling Counseling given: Not Answered   Clinical Intake:  Pre-visit preparation completed: Yes  Pain : No/denies pain     Nutritional Risks: None Diabetes: No  How often do you need to have someone help you when you read instructions, pamphlets, or other written materials from your doctor or pharmacy?: 1 - Never  Interpreter Needed?: No  Information entered by :: NAllen LPN   Activities of Daily Living    09/22/2023    8:12 AM  In your present state of health, do you have any difficulty performing the following activities:  Hearing? 0  Vision? 0  Difficulty concentrating or making decisions? 0  Walking or climbing stairs? 0  Dressing or bathing? 0  Doing errands, shopping? 0  Preparing Food and eating ? N  Using the Toilet? N  In the past six months, have you accidently leaked urine? N  Comment had prostate cancer, resolved  Do you have problems with loss of bowel control? N  Managing your Medications? N  Managing your Finances? N  Housekeeping or managing your Housekeeping? N    Patient Care Team: Wallace Joesph LABOR, PA as PCP - General (Family Medicine) Jordan, [name], MD as PCP - Cardiology (Cardiology) Jordan, [name], MD as  Consulting Physician (Cardiology) Nahser, Aleene PARAS, MD as Consulting Physician (Cardiology) Alline Lenis, MD (Inactive) as Consulting Physician (Urology) Shona Rush, MD (Dermatology) Specialists, Dermatology (Dermatology)  Indicate any recent Medical Services you may have received from other than [facility] providers in the past year (date may be approximate).     Assessment:   This is a routine wellness examination for Dennis Lewis.  Hearing/Vision screen Hearing Screening - Comments:: Denies hearing issues Vision Screening - Comments:: Regular eye exams, [facility]   Goals Addressed             This Visit's Progress    Patient Stated       09/22/2023, do more walking       Depression Screen    09/22/2023    8:19 AM 11/11/2022    1:29 PM 12/10/2020    8:43 AM 09/19/2019    8:10 AM 07/19/2018    3:47 PM 06/07/2018    3:44 PM 06/07/2018    3:43 PM  PHQ 2/9 Scores  PHQ - 2 Score 0 0 0 0 0 0 0  PHQ- 9 Score   0 1 1 3      Fall Risk    09/22/2023    8:18 AM 11/11/2022    1:29 PM 12/10/2020    8:42 AM 09/19/2019    8:10 AM 06/07/2018    3:43 PM  Fall Risk   Falls in the past year? 1 0 0 0 No  Comment tripped over water hose      Number falls in past yr: 0 0  0   Injury with Fall? 1 0  0   Comment cut ear      Risk for fall due to : Medication side effect      Follow up Falls prevention discussed;Falls evaluation completed  Falls evaluation completed Falls evaluation completed     MEDICARE RISK AT HOME: Medicare Risk at Home Any stairs in or around the home?: No If so, are there any without handrails?: No Home free of loose throw rugs in walkways, pet beds, electrical cords, etc?: Yes Adequate lighting in your home to reduce risk of falls?: Yes Life alert?: No Use of a cane, walker or w/c?: No Grab bars in the bathroom?: No Shower chair or bench in shower?: No Elevated toilet seat or a handicapped toilet?: Yes  TIMED UP AND GO:  Was the test performed? No    Cognitive  Function:        09/22/2023    8:19 AM  6CIT Screen  What Year? 0 points  What month? 0 points  What time? 0 points  Count back from 20 0 points  Months in reverse 0 points  Repeat phrase 4 points  Total Score 4 points    Immunizations Immunization History  Administered Date(s) Administered   Influenza,inj,Quad PF,6+ Mos 09/19/2019   Zoster Recombinant(Shingrix) 09/19/2019    TDAP status: Due, Education has been provided regarding the importance of this vaccine. Advised may receive this vaccine at local pharmacy or Health Dept. Aware to provide a copy of the vaccination record if obtained from local pharmacy or Health Dept. Verbalized acceptance and understanding.  Flu Vaccine status: Declined, Education has been provided regarding the importance of this vaccine but patient still declined. Advised may receive this vaccine at local pharmacy or Health Dept. Aware to provide a copy of the vaccination record if obtained from local pharmacy or Health Dept. Verbalized acceptance and understanding.  Pneumococcal vaccine status: Due, Education has been provided regarding the importance of this vaccine. Advised may receive this vaccine at local pharmacy or Health Dept. Aware to provide a copy of the vaccination record if obtained from local pharmacy or Health Dept. Verbalized acceptance and understanding.  Covid-19 vaccine status: Declined, Education has been provided regarding the importance of this vaccine but patient still declined. Advised may receive this vaccine at local pharmacy or Health Dept.or vaccine clinic. Aware to provide a copy of the vaccination record if obtained from local pharmacy or Health Dept. Verbalized acceptance and understanding.  Qualifies for Shingles Vaccine? Yes   Zostavax completed No   Shingrix Completed?: needs second dose  Screening Tests Health Maintenance  Topic Date Due   Pneumonia Vaccine 9+ Years old (1 of 2 - PCV) Never done   DTaP/Tdap/Td (1 -  Tdap) Never done   Zoster Vaccines- Shingrix (2 of 2) 11/14/2019   COVID-19 Vaccine (1) 10/08/2023 (Originally 11/02/1960)   INFLUENZA VACCINE  12/19/2023 (Originally 04/21/2023)   Medicare Annual Wellness (AWV)  09/21/2024   Colonoscopy  07/28/2025   Hepatitis C Screening  Completed   HPV VACCINES  Aged Out    Health Maintenance  Health Maintenance Due  Topic Date Due   Pneumonia Vaccine 32+ Years old (1 of 2 - PCV) Never done   DTaP/Tdap/Td (1 - Tdap) Never done   Zoster Vaccines- Shingrix (2 of 2) 11/14/2019    Colorectal cancer screening: Type of screening: Colonoscopy. Completed 07/29/2015. Repeat every 10 years  Lung Cancer Screening: (Low Dose CT Chest recommended if Age 84-80 years, 20 pack-year currently smoking OR have quit w/in 15years.) does  not qualify.   Lung Cancer Screening Referral: no  Additional Screening:  Hepatitis C Screening: does qualify; Completed 09/19/2019  Vision Screening: Recommended annual ophthalmology exams for early detection of glaucoma and other disorders of the eye. Is the patient up to date with their annual eye exam?  Yes  Who is the provider or what is the name of the office in which the patient attends annual eye exams? [facility] If pt is not established with a provider, would they like to be referred to a provider to establish care? No .   Dental Screening: Recommended annual dental exams for proper oral hygiene  Diabetic Foot Exam: n/a  Community Resource Referral / Chronic Care Management: CRR required this visit?  No   CCM required this visit?  No    Plan:     I have personally reviewed and noted the following in the patient's chart:   Medical and social history Use of alcohol, tobacco or illicit drugs  Current medications and supplements including opioid prescriptions. Patient is not currently taking opioid prescriptions. Functional ability and status Nutritional status Physical activity Advanced directives List of  other physicians Hospitalizations, surgeries, and ER visits in previous 12 months Vitals Screenings to include cognitive, depression, and falls Referrals and appointments  In addition, I have reviewed and discussed with patient certain preventive protocols, quality metrics, and best practice recommendations. A written personalized care plan for preventive services as well as general preventive health recommendations were provided to patient.     Ardella FORBES Dawn, LPN   04/26/7973   After Visit Summary: (MyChart) Due to this being a telephonic visit, the after visit summary with patients personalized plan was offered to patient via MyChart   Nurse Notes: none

## 2023-09-22 NOTE — Patient Instructions (Addendum)
 Dennis Lewis , Thank you for taking time to come for your Medicare Wellness Visit. I appreciate your ongoing commitment to your health goals. Please review the following plan we discussed and let me know if I can assist you in the future.   Referrals/Orders/Follow-Ups/Clinician Recommendations: none  This is a list of the screening recommended for you and due dates:  Health Maintenance  Topic Date Due   Pneumonia Vaccine (1 of 2 - PCV) Never done   DTaP/Tdap/Td vaccine (1 - Tdap) Never done   Zoster (Shingles) Vaccine (2 of 2) 11/14/2019   COVID-19 Vaccine (1) 10/08/2023*   Flu Shot  12/19/2023*   Medicare Annual Wellness Visit  09/21/2024   Colon Cancer Screening  07/28/2025   Hepatitis C Screening  Completed   HPV Vaccine  Aged Out  *Topic was postponed. The date shown is not the original due date.    Advanced directives: (ACP Link)Information on Advanced Care Planning can be found at [place]  Secretary of [facility] Advance Health Care Directives Advance Health Care Directives ([email or web address]   Next Medicare Annual Wellness Visit scheduled for next year: No, will schedule next year  insert Preventive Care attachment Insert FALL PREVENTION attachment if needed

## 2023-10-07 ENCOUNTER — Other Ambulatory Visit (HOSPITAL_COMMUNITY): Payer: Self-pay

## 2023-10-07 ENCOUNTER — Other Ambulatory Visit: Payer: Self-pay | Admitting: Cardiology

## 2023-10-07 MED ORDER — COLCHICINE 0.6 MG PO TABS
0.6000 mg | ORAL_TABLET | ORAL | 1 refills | Status: DC | PRN
  Filled 2023-10-07: qty 30, 30d supply, fill #0

## 2023-10-07 NOTE — Telephone Encounter (Signed)
Pt is requesting a refill on colchicine. Would Dr. Swaziland like to refill thi non cardiac medication? Please address

## 2023-10-07 NOTE — Telephone Encounter (Signed)
*  STAT* If patient is at the pharmacy, call can be transferred to refill team.   1. Which medications need to be refilled? (please list name of each medication and dose if known)   colchicine 0.6 MG tablet   2. Which pharmacy/location (including street and city if local pharmacy) is medication to be sent to?  [facility] - [place], [place] - [address] Phone: [phone]  Fax: [phone]      3. Do they need a 30 day or 90 day supply? 90 Pt states that dr Swaziland originally prescribed med

## 2023-10-27 ENCOUNTER — Encounter: Payer: Self-pay | Admitting: Family Medicine

## 2023-11-21 ENCOUNTER — Other Ambulatory Visit: Payer: Self-pay | Admitting: Family Medicine

## 2023-11-21 DIAGNOSIS — K219 Gastro-esophageal reflux disease without esophagitis: Secondary | ICD-10-CM

## 2023-12-13 ENCOUNTER — Other Ambulatory Visit: Payer: Self-pay | Admitting: Family Medicine

## 2023-12-13 DIAGNOSIS — K219 Gastro-esophageal reflux disease without esophagitis: Secondary | ICD-10-CM

## 2023-12-13 MED ORDER — ESOMEPRAZOLE MAGNESIUM 20 MG PO CPDR: 20.0000 mg | DELAYED_RELEASE_CAPSULE | Freq: Two times a day (BID) | ORAL | 1 refills | Status: DC

## 2024-01-28 NOTE — Progress Notes (Deleted)
 Cardiology Office Note    Date:  01/28/2024   ID:  Dennis Lewis, DOB Nov 26, 1955, MRN [id number]  PCP:  Noreene Bearded, PA  Cardiologist:  Dr. Jamey Demchak [place]    History of Present Illness:  Dennis Lewis is a 68 y.o. male seen for follow up CHF. He has a PMH of hypertension, GERD, hyperlipidemia, chronic LBBB, history of NSVT, prostate cancer s/p radiation therapy, persistent atrial flutter, and NICM with EF 35-40%. He has a history of Etoh abuse.  He was admitted for NSTEMI on 06/06/2016, he underwent emergent cardiac catheterization which only showed 30% proximal RCA disease, 25-35% ejection fraction, pattern of apical wall motion abnormality.  Surprisingly, his troponin peaked at 31.36 which is a lot higher than expected for stress-induced cardiomyopathy. Retrospectively, it is possible that he had an embolic MI.  Imdur  and hydralazine  were added. Initial echo obtained in September showed ejection fraction 25-30%. He was also noted to have new atrial fibrillation, due to elevated CHA2DS2-Vasc score, he was started on eliquis . Repeat echocardiogram 6 weeks later on 07/20/2016 showed ejection fraction has improved to 40-45%, grade 3 diastolic dysfunction, paradoxical ventricular septal motion consistent with LBBB.   He was seen in May 2019  for preoperative clearance prior to right elbow and upper arm procedure. Repeat Echo was ordered but rescheduled multiple times and finally performed in September 2019. This demonstrated reduced LV function with EF 25-30% .  His persistent atrial fib/flutter  rate was controlled. He was started on Entresto  and dose titrated by Pharm D. He was admitted overnight with CHF exacerbation on 09/16/18. Lasix  had been recently reduced due to gout. Afib rate was elevated.  He was seen in January 2020 and spironolactone  was increased. Since then we have worked on optimizing medical therapy  He did have follow up Echo showing improvement in EF to 40-45%. On his visit in June  2023 we recommended using lasix  only PRN. We also stopped aldactone  due to hyperkalemia. He presented to the ED on 06/01/22 with increased CHF. CXR showed pulmonary edema. BNP was 777. Was diuresed and DC back on lasix . On subsequent follow up he has done well.    On follow up today he is doing very well. No swelling, dyspnea, chest pain or palpitations. Energy level is very good. States he can walk a long way without any problems. Still working.    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   GERD (gastroesophageal reflux disease)    H/O cardiovascular stress test 2010   Adenosine  Myoview    H/O echocardiogram 2010    mild LVH with some septal dyssynergy, EF 45%, impaired relaxation   History of prostate cancer    Hyperlipidemia    Hypertension    Left bundle branch block    NSVT (nonsustained ventricular tachycardia) (HCC)    Palpitations    Prostate cancer (HCC) 04/02/14   Gleason 7, volume 30 gm   S/P radiation therapy 06/26/2014 through 08/22/2014                                                      Prostate 7800 cGy in 40  sessions                         Torn tendon    right shoulder    Past Surgical History:  Procedure Laterality Date   BACK SURGERY  1993   CARDIAC CATHETERIZATION  1990   Normal cors   CARDIAC CATHETERIZATION N/A 06/06/2016   Procedure: Left Heart Cath and Coronary Angiography;  Surgeon: Lucendia Rusk, MD;  Location: [facility] INVASIVE CV LAB;  Service: Cardiovascular;  Laterality: N/A;   PROSTATE BIOPSY  04/02/14   Gleason 7, vol 30 gm    Current Medications: Allergies as of 01/31/2024       Reactions   Chocolate Flavoring Agent (non-screening) Swelling   Ace Inhibitors Other (See Comments)   Cold sores   Chocolate Other (See Comments)   Blisters   Other Other (See Comments)   Seed on  bread causes lip blisters Anesthesia also, but does nor remember which one   Peanut-containing Drug Products Other (See Comments)   Blisters   Hydralazine  Palpitations   Irregular heartbeat, fluttering        Medication List        Accurate as of Jan 28, 2024  9:10 AM. If you have any questions, ask your nurse or doctor.          colchicine  0.6 MG tablet Take 1 tablet (0.6 mg total) by mouth as needed (gout).   Eliquis  5 MG Tabs tablet Generic drug: apixaban  Take 1 tablet by mouth twice daily   empagliflozin  10 MG Tabs tablet Commonly known as: Jardiance  Take 1 tablet (10 mg total) by mouth daily before breakfast.   Entresto  97-103 MG Generic drug: sacubitril -valsartan  Take 1 tablet by mouth twice daily   esomeprazole  20 MG capsule Commonly known as: NEXIUM  Take 1 capsule (20 mg total) by mouth 2 (two) times daily at 8 am and 10 pm. Take first dose before breakfast. After 4 weeks, start tapering back down to 20 mg before breakfast.  You can start by taking your usual dose twice a day every other day for about a week and reduce the frequency from there.   furosemide  20 MG tablet Commonly known as: LASIX  TAKE 2 TABLETS BY MOUTH ONCE DAILY .   HYDROcodone -acetaminophen  5-325 MG tablet Commonly known as: NORCO/VICODIN Take 1 tablet by mouth as needed.   metoprolol  succinate 100 MG 24 hr tablet Commonly known as: TOPROL -XL TAKE 1 TABLET BY MOUTH IN THE MORNING   metoprolol  succinate 50 MG 24 hr tablet Commonly known as: TOPROL -XL TAKE 1 TABLET BY MOUTH ONCE DAILY IN THE EVENING WITH MEALS OR IMMEDIATELY FOLLOWING A MEAL   rosuvastatin  40 MG tablet Commonly known as: CRESTOR  Take 1 tablet by mouth once daily        Allergies:   Chocolate flavoring agent (non-screening), Ace inhibitors, Chocolate, Other, Peanut-containing drug products, and Hydralazine    Social History   Socioeconomic History   Marital status: Married    Spouse name: Not on file   Number  of children: 2   Years of education: Not on file   Highest education level: Not on file  Occupational History   Occupation: Copywriter, advertising  Tobacco Use   Smoking status: Former    Current packs/day: 0.00    Types: Cigarettes    Quit date: 02/22/1979    Years since quitting: 44.9    Passive exposure: Never   Smokeless tobacco: Never  Vaping Use   Vaping  status: Never Used  Substance and Sexual Activity   Alcohol use: Yes    Alcohol/week: 28.0 standard drinks of alcohol    Types: 28 Cans of beer per week    Comment: beer daily - 4 daily   Drug use: No   Sexual activity: Not Currently  Other Topics Concern   Not on file  Social History Narrative   Not on file   Social Drivers of Health   Financial Resource Strain: Low Risk  (09/22/2023)   Overall Financial Resource Strain (CARDIA)    Difficulty of Paying Living Expenses: Not hard at all  Food Insecurity: No Food Insecurity (09/22/2023)   Hunger Vital Sign    Worried About Running Out of Food in the Last Year: Never true    Ran Out of Food in the Last Year: Never true  Transportation Needs: No Transportation Needs (09/22/2023)   PRAPARE - Administrator, Civil Service (Medical): No    Lack of Transportation (Non-Medical): No  Physical Activity: Sufficiently Active (09/22/2023)   Exercise Vital Sign    Days of Exercise per Week: 4 days    Minutes of Exercise per Session: 60 min  Stress: No Stress Concern Present (09/22/2023)   [employer] of Occupational Health - Occupational Stress Questionnaire    Feeling of Stress : Not at all  Social Connections: Moderately Isolated (09/22/2023)   Social Connection and Isolation Panel [NHANES]    Frequency of Communication with Friends and Family: More than three times a week    Frequency of Social Gatherings with Friends and Family: Once a week    Attends Religious Services: Never    Database administrator or Organizations: No    Attends Engineer, structural: Never     Marital Status: Married     Family History:  The patient's family history includes Esophageal cancer in his father; Prostate cancer in his brother; Throat cancer (age of onset: 60) in his father.   ROS:   Please see the history of present illness.    ROSd All other systems reviewed and are negative.   PHYSICAL EXAM:   VS:  There were no vitals taken for this visit.  GENERAL:  Well appearing WM in NAD HEENT:  PERRL, EOMI, sclera are clear. Oropharynx is clear. NECK:  No jugular venous distention, carotid upstroke brisk and symmetric, no bruits, no thyromegaly or adenopathy LUNGS:  Clear to auscultation bilaterally CHEST:  Unremarkable HEART:  IRRR,  PMI not displaced or sustained,S1 and S2 within normal limits, no S3, no S4: no clicks, no rubs, no murmurs ABD:  Soft, nontender. BS +, no masses or bruits. No hepatomegaly, no splenomegaly EXT:  2 + pulses throughout, no edema, no cyanosis no clubbing SKIN:  Warm and dry.  No rashes NEURO:  Alert and oriented x 3. Cranial nerves II through XII intact. PSYCH:  Cognitively intact    Wt Readings from Last 3 Encounters:  06/24/23 165 lb 12.8 oz (75.2 kg)  03/28/23 170 lb 9.6 oz (77.4 kg)  01/27/23 178 lb (80.7 kg)      Studies/Labs Reviewed:   EKG:  EKG is not  ordered today.      Recent Labs: 03/31/2023: ALT 26; BUN 16; Creatinine, Ser 1.00; Hemoglobin 15.2; Magnesium  2.4; Platelets 174; Potassium 5.2; Sodium 143   Lipid Panel    Component Value Date/Time   CHOL 144 03/31/2023 0901   TRIG 100 03/31/2023 0901   HDL 81 03/31/2023 0901  CHOLHDL 1.8 03/31/2023 0901   LDLCALC 45 03/31/2023 0901    Additional studies/ records that were reviewed today include:  Labs dated 02/16/17: cholesterol 255, triglycerides 291, HDL 44, LDL 153. Chemistries, CBC, TSH normal.   Cath 06/06/2016 Conclusion     Prox RCA lesion, 30 %stenosed at a bend. The left ventricular ejection fraction is 25-35% by visual estimate. The pattern of  apical wall motion abnormality without severe CAD is suggestive of Takotsubo cardiomyopathy. LV end diastolic pressure is moderately elevated. There is no aortic valve stenosis.   Continue with aggressive medical therapy. He is allergic to ace inhibitors. Will add isosorbide  to his hydralazine . Continue beta blocker. 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 He states that he was lying in bed for about 3 hours very worried about what he would do. He then started to have chest discomfort. The history is also consistent with Takatsubo cardiomyopathy.  May need diuresis given moderately elevated LVEDP.      Echo 07/20/2016 LV EF: 40% -   45%   Study Conclusions   - Left ventricle: The cavity size was normal. Systolic function was   mildly to moderately reduced. The estimated ejection fraction was   in the range of 40% to 45%. Diffuse hypokinesis. There was a   reduced contribution of atrial contraction to ventricular   filling, due to increased ventricular diastolic pressure or   atrial contractile dysfunction. Doppler parameters are consistent   with a reversible restrictive pattern, indicative of decreased   left ventricular diastolic compliance and/or increased left   atrial pressure (grade 3 diastolic dysfunction). - Ventricular septum: Septal motion showed moderate paradox. These   changes are consistent with intraventricular conduction delay. - Left atrium: The atrium was mildly dilated. - Pulmonary arteries: PA peak pressure: 40 mm Hg (S).   Impressions:   - The right ventricular systolic pressure was increased consistent   with mild pulmonary hypertension.   Echo 05/31/18: Study Conclusions   - Left ventricle: Systolic function was severely reduced. The   estimated ejection fraction was in the range of 25% to 30%. - Mitral valve: There was mild regurgitation.   Echo 02/25/22: IMPRESSIONS     1. Left  ventricular ejection fraction, by estimation, is 40 to 45%. The  left ventricle has mildly decreased function. The left ventricle  demonstrates global hypokinesis. There is mild left ventricular  hypertrophy. Left ventricular diastolic parameters  are consistent with Grade I diastolic dysfunction (impaired relaxation).   2. Right ventricular systolic function is normal. The right ventricular  size is mildly enlarged.   3. The mitral valve is normal in structure. No evidence of mitral valve  regurgitation. No evidence of mitral stenosis.   4. The aortic valve is tricuspid. There is mild calcification of the  aortic valve. There is mild thickening of the aortic valve. Aortic valve  regurgitation is mild. Aortic valve sclerosis is present, with no evidence  of aortic valve stenosis.   5. The inferior vena cava is normal in size with greater than 50%  respiratory variability, suggesting right atrial pressure of 3 mmHg.   Comparison(s): The left ventricular function has improved. Prior EF  25-30%.   ASSESSMENT:    No diagnosis found.     PLAN:  In order of problems listed above:    Chronic systolic CHF. NYHA functional class 1.  NICM:  Now on good medical therapy with Entresto , Toprol  XL, Jardiance , and  lasix .  HR is well controlled.  EF has improved from 25-30% to 40-45%. LV dysfunction is multifactorial related to prior Etoh abuse, dyssynergy with LBBB, prior embolic event, Afib. Intolerant of aldactone  due to hyperkalemia. Now back on lasix . Continue low sodium diet.   Chronic atrial fib/flutter: on eliquis  5mg  BID. CHA2DS2-Vasc score 2 (HTN, HF).  Rate is now well controlled.   Hypertension:  well controlled.   Hyperlipidemia: at goal on statin therapy.    5.   Gout. No recurrent symptoms.   6.   LBBB chronic  Follow up in 6 months.  Medication Adjustments/Labs and Tests Ordered: Current medicines are reviewed at length with the patient today.  Concerns regarding medicines  are outlined above.  Medication changes, Labs and Tests ordered today are listed in the Patient Instructions below. There are no Patient Instructions on file for this visit.   Follow up 6 months.   Signed, Suzan Manon [place], MD  01/28/2024 9:10 AM    [facility] [address] [place], [place], [place]  [address] Phone: [phone]; Fax: [phone]

## 2024-01-31 ENCOUNTER — Ambulatory Visit: Payer: HMO | Attending: Cardiology | Admitting: Cardiology

## 2024-02-01 ENCOUNTER — Encounter: Payer: Self-pay | Admitting: Cardiology

## 2024-02-09 ENCOUNTER — Other Ambulatory Visit: Payer: Self-pay | Admitting: Family Medicine

## 2024-02-09 DIAGNOSIS — K219 Gastro-esophageal reflux disease without esophagitis: Secondary | ICD-10-CM

## 2024-02-09 MED ORDER — ESOMEPRAZOLE MAGNESIUM 20 MG PO CPDR: 20.0000 mg | DELAYED_RELEASE_CAPSULE | Freq: Two times a day (BID) | ORAL | 0 refills | Status: DC

## 2024-02-22 DIAGNOSIS — L821 Other seborrheic keratosis: Secondary | ICD-10-CM | POA: Diagnosis not present

## 2024-02-22 DIAGNOSIS — M7021 Olecranon bursitis, right elbow: Secondary | ICD-10-CM | POA: Diagnosis not present

## 2024-02-22 DIAGNOSIS — Z85828 Personal history of other malignant neoplasm of skin: Secondary | ICD-10-CM | POA: Diagnosis not present

## 2024-02-22 DIAGNOSIS — D225 Melanocytic nevi of trunk: Secondary | ICD-10-CM | POA: Diagnosis not present

## 2024-02-22 DIAGNOSIS — Z08 Encounter for follow-up examination after completed treatment for malignant neoplasm: Secondary | ICD-10-CM | POA: Diagnosis not present

## 2024-02-22 DIAGNOSIS — L820 Inflamed seborrheic keratosis: Secondary | ICD-10-CM | POA: Diagnosis not present

## 2024-02-22 DIAGNOSIS — G8929 Other chronic pain: Secondary | ICD-10-CM | POA: Diagnosis not present

## 2024-03-12 ENCOUNTER — Ambulatory Visit: Payer: Medicare HMO | Admitting: Family Medicine

## 2024-03-14 ENCOUNTER — Other Ambulatory Visit: Payer: Self-pay | Admitting: Cardiology

## 2024-03-14 DIAGNOSIS — I4892 Unspecified atrial flutter: Secondary | ICD-10-CM

## 2024-03-14 NOTE — Telephone Encounter (Signed)
 Eliquis  5mg  refill request received. Patient is 68 years old, weight-75.2kg, Crea- 1.00 on 03/31/23, Diagnosis-Aflutter, and last seen by Dr. [place] on 06/24/23. Dose is appropriate based on dosing criteria. Will send in refill to requested pharmacy.

## 2024-03-17 ENCOUNTER — Other Ambulatory Visit: Payer: Self-pay | Admitting: Family Medicine

## 2024-03-17 DIAGNOSIS — K219 Gastro-esophageal reflux disease without esophagitis: Secondary | ICD-10-CM

## 2024-04-06 DIAGNOSIS — M545 Low back pain, unspecified: Secondary | ICD-10-CM | POA: Diagnosis not present

## 2024-04-06 DIAGNOSIS — M791 Myalgia, unspecified site: Secondary | ICD-10-CM | POA: Diagnosis not present

## 2024-04-09 ENCOUNTER — Ambulatory Visit

## 2024-04-13 ENCOUNTER — Ambulatory Visit: Admitting: Cardiology

## 2024-04-17 ENCOUNTER — Ambulatory Visit: Payer: Self-pay | Admitting: Allergy

## 2024-04-18 ENCOUNTER — Ambulatory Visit: Payer: Self-pay | Admitting: Allergy

## 2024-04-18 ENCOUNTER — Other Ambulatory Visit: Payer: Self-pay

## 2024-04-18 ENCOUNTER — Encounter: Payer: Self-pay | Admitting: Allergy

## 2024-04-18 VITALS — BP 130/84 | HR 66 | Temp 98.1°F | Resp 18 | Ht 68.0 in | Wt 170.9 lb

## 2024-04-18 DIAGNOSIS — T781XXD Other adverse food reactions, not elsewhere classified, subsequent encounter: Secondary | ICD-10-CM | POA: Diagnosis not present

## 2024-04-18 DIAGNOSIS — H1013 Acute atopic conjunctivitis, bilateral: Secondary | ICD-10-CM

## 2024-04-18 DIAGNOSIS — H109 Unspecified conjunctivitis: Secondary | ICD-10-CM

## 2024-04-18 DIAGNOSIS — H6122 Impacted cerumen, left ear: Secondary | ICD-10-CM

## 2024-04-18 DIAGNOSIS — J310 Chronic rhinitis: Secondary | ICD-10-CM | POA: Diagnosis not present

## 2024-04-18 NOTE — Patient Instructions (Signed)
 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  - Schedule skin testing for food allergens including peanuts, chocolate, oats, and citrus fruits. - Avoid antihistamines for three days prior to skin testing. - Consider prescribing an epinephrine device if testing indicates a food allergy.  Allergic rhinitis with conjunctival symptoms Symptoms suggest environmental allergen sensitivity. - Schedule skin testing for environmental allergens. - Avoid antihistamines for three days prior to skin testing. - Provide recommendations for managing symptoms based on test results.  Impacted cerumen, left ear Increased earwax causing discomfort in left ear. - Recommend over-the-counter earwax removal kit, such as Debrox.  Schedule skin testing visit

## 2024-04-18 NOTE — Progress Notes (Signed)
 New Patient Note  RE: Dennis Lewis MRN: [id number] DOB: [birth year] Date of Office Visit: 04/18/2024  Primary care provider: Gayle Saddie FALCON, PA-C  Chief Complaint: Food reactions  History of present illness: Dennis Lewis is a 68 y.o. male presenting today for evaluation of food allergy. Discussed the use of AI scribe software for clinical note transcription with the patient, who gave verbal consent to proceed.  He has experienced cold sores/blisters mostly periorally, particularly around the mouth and mustache area, after consuming certain foods such as chocolate, citrus fruits, strawberries, pineapple, peanuts, and oatmeal since he was 68 years old. He describes these as 'blisters' or cold sores and avoids these foods to prevent the blisters.  He states once with oatmeal he had the blisters occur around the eye and extended down his cheek and nose area.  Soft drinks also trigger similar reactions, although he has not consumed them in three to five years. When blisters occur, he takes Valtrex , which he finds helpful in managing the symptoms. No symptoms such as trouble breathing, nausea, vomiting, diarrhea, lightheadedness, or dizziness after consuming these foods. No blisters or other symptoms inside the mouth.   He experiences runny nose, watery, itchy, and red eyes, particularly in cold weather. He uses dry eye drops for relief but does not take allergy pills. He reports sneezing and throat clearing but denies any history of asthma or eczema.  He mentions a past incident where exposure to steam caused breathing difficulties, leading to the use of an inhaler. He attributes this to his previous work involving steam, which he has since stopped, resulting in improved respiratory health. He also notes a history of irregular heartbeat causing palpitations, for which he sees a doctor.     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   GERD (gastroesophageal reflux disease)    H/O cardiovascular stress test 2010   Adenosine  Myoview    H/O echocardiogram 2010    mild LVH with some septal dyssynergy, EF 45%, impaired relaxation   History of prostate cancer    Hyperlipidemia    Hypertension    Left bundle branch block    NSVT (nonsustained ventricular tachycardia) (HCC)    Palpitations    Prostate cancer (HCC) 04/02/2014   Gleason 7, volume 30 gm   S/P radiation therapy 06/26/2014 through 08/22/2014                                                      Prostate 7800 cGy in 40 sessions                         Torn tendon    right shoulder    Past surgical history: Past Surgical History:  Procedure Laterality Date   BACK SURGERY  09/21/1991   CARDIAC CATHETERIZATION  09/20/1988   Normal cors   CARDIAC CATHETERIZATION N/A 06/06/2016   Procedure: Left Heart Cath and Coronary Angiography;  Surgeon: Candyce GORMAN Reek, MD;  Location: [facility] INVASIVE CV LAB;  Service: Cardiovascular;  Laterality: N/A;  PROSTATE BIOPSY  04/02/2014   Gleason 7, vol 30 gm    Family history:  Family History  Problem Relation Age of Onset   Asthma Mother    Throat cancer Father 62       smoker   Esophageal cancer Father    Prostate cancer Brother        surgery   Colon cancer Neg Hx    Diabetes Neg Hx    Colon polyps Neg Hx     Social history: Lives in a home with carpeting in the bedroom with gas heating and central cooling.  No pets in the home.  There is no concern for roaches in the home.  He works in Product manager.  Denies a smoking history.   Medication List: Current Outpatient Medications  Medication Sig Dispense Refill   apixaban  (ELIQUIS ) 5 MG TABS tablet Take 1 tablet by mouth twice daily 180 tablet 1    colchicine  0.6 MG tablet Take 1 tablet (0.6 mg total) by mouth as needed (gout). 30 tablet 1   empagliflozin  (JARDIANCE ) 10 MG TABS tablet Take 1 tablet (10 mg total) by mouth daily before breakfast. 90 tablet 3   esomeprazole  (NEXIUM ) 20 MG capsule TAKE 1 CAPSULE BY MOUTH TWICE DAILY AT 8AM AND 10PM 60 capsule 0   furosemide  (LASIX ) 20 MG tablet TAKE 2 TABLETS BY MOUTH ONCE DAILY . 180 tablet 2   metoprolol  succinate (TOPROL -XL) 100 MG 24 hr tablet TAKE 1 TABLET BY MOUTH IN THE MORNING 90 tablet 2   metoprolol  succinate (TOPROL -XL) 50 MG 24 hr tablet TAKE 1 TABLET BY MOUTH ONCE DAILY IN THE EVENING WITH MEALS OR IMMEDIATELY FOLLOWING A MEAL 90 tablet 2   omeprazole  (PRILOSEC) 20 MG capsule Take 20 mg by mouth daily.     rosuvastatin  (CRESTOR ) 40 MG tablet Take 1 tablet by mouth once daily 90 tablet 2   sacubitril -valsartan  (ENTRESTO ) 97-103 MG Take 1 tablet by mouth twice daily 180 tablet 3   valACYclovir  (VALTREX ) 1000 MG tablet SMARTSIG:1 Tablet(s) By Mouth Every 12 Hours     HYDROcodone -acetaminophen  (NORCO/VICODIN) 5-325 MG tablet Take 1 tablet by mouth as needed. (Patient not taking: Reported on 04/18/2024)     No current facility-administered medications for this visit.    Known medication allergies: Allergies  Allergen Reactions   Chocolate Flavoring Agent (Non-Screening) Swelling   Ace Inhibitors Other (See Comments)    Cold sores    Chocolate Other (See Comments)    Blisters    Other Other (See Comments)    Seed on bread causes lip blisters Anesthesia also, but does nor remember which one   Peanut-Containing Drug Products Other (See Comments)    Blisters   Hydralazine  Palpitations    Irregular heartbeat, fluttering     Physical examination: Blood pressure 130/84, pulse 66, temperature 98.1 F (36.7 C), resp. rate 18, height 5' 8 (1.727 m), weight 170 lb 14.4 oz (77.5 kg), SpO2 98%.  General: Alert, interactive, in no acute distress. HEENT: PERRLA, rt TMs pearly gray,  lt ear with cerumen impacting visualization of TM, turbinates non-edematous without discharge, post-pharynx non erythematous. Neck: Supple without lymphadenopathy. Lungs: Clear to auscultation without wheezing, rhonchi or rales. {no increased work of breathing. CV: Normal S1, S2 without murmurs. Abdomen: Nondistended, nontender. Skin: Warm and dry, without lesions or rashes. Extremities:  No clubbing, cyanosis or edema. Neuro:   Grossly intact.  Diagnostics/Labs: None today  Assessment and plan: Recurrent perioral blisters triggered by specific  foods Blisters triggered by specific foods, managed with Valtrex .  - Schedule skin testing for food allergens including peanuts, chocolate, oats, and citrus fruits. - Avoid antihistamines for three days prior to skin testing. - Consider prescribing an epinephrine device if testing indicates a food allergy.  Allergic rhinitis with conjunctival symptoms Symptoms suggest environmental allergen sensitivity. - Schedule skin testing for environmental allergens. - Avoid antihistamines for three days prior to skin testing. - Provide recommendations for managing symptoms based on test results.  Impacted cerumen, left ear Increased earwax causing discomfort in left ear. - Recommend over-the-counter earwax removal kit, such as Debrox.  Schedule skin testing visit (env 1-55, foods: peanut, oat, orange, lemon, pineapple, chocolate)  I appreciate the opportunity to take part in Omar's care. Please do not hesitate to contact me with questions.  Sincerely,   Danita Brain, MD Allergy/Immunology [facility]

## 2024-04-25 ENCOUNTER — Encounter: Payer: Self-pay | Admitting: Cardiology

## 2024-05-03 ENCOUNTER — Other Ambulatory Visit: Payer: Self-pay | Admitting: Family Medicine

## 2024-05-03 DIAGNOSIS — K219 Gastro-esophageal reflux disease without esophagitis: Secondary | ICD-10-CM

## 2024-05-14 NOTE — Progress Notes (Unsigned)
 Cardiology Office Note    Date:  05/17/2024   ID:  Dennis Lewis, DOB 02/27/1956, MRN [id number]  PCP:  Gayle Saddie FALCON, PA-C  Cardiologist:  Dr. Chou Busler [place]    History of Present Illness:  Dennis Lewis is a 68 y.o. male seen for follow up CHF. He has a PMH of hypertension, GERD, hyperlipidemia, chronic LBBB, history of NSVT, prostate cancer s/p radiation therapy, persistent atrial flutter, and NICM with EF 35-40%. He has a history of Etoh abuse.  He was admitted for NSTEMI on 06/06/2016, he underwent emergent cardiac catheterization which only showed 30% proximal RCA disease, 25-35% ejection fraction, pattern of apical wall motion abnormality.  Surprisingly, his troponin peaked at 31.36 which is a lot higher than expected for stress-induced cardiomyopathy. Retrospectively, it is possible that he had an embolic MI.  Imdur  and hydralazine  were added. Initial echo obtained in September showed ejection fraction 25-30%. He was also noted to have new atrial fibrillation, due to elevated CHA2DS2-Vasc score, he was started on eliquis . Repeat echocardiogram 6 weeks later on 07/20/2016 showed ejection fraction has improved to 40-45%, grade 3 diastolic dysfunction, paradoxical ventricular septal motion consistent with LBBB.   He was seen in May 2019  for preoperative clearance prior to right elbow and upper arm procedure. Repeat Echo was ordered but rescheduled multiple times and finally performed in September 2019. This demonstrated reduced LV function with EF 25-30% .  His persistent atrial fib/flutter  rate was controlled. He was started on Entresto  and dose titrated by Pharm D. He was admitted overnight with CHF exacerbation on 09/16/18. Lasix  had been recently reduced due to gout. Afib rate was elevated.  He was seen in January 2020 and spironolactone  was increased. Since then we have worked on optimizing medical therapy  He did have follow up Echo showing improvement in EF to 40-45%. On his visit in June  2023 we recommended using lasix  only PRN. We also stopped aldactone  due to hyperkalemia. He presented to the ED on 06/01/22 with increased CHF. CXR showed pulmonary edema. BNP was 777. Was diuresed and DC back on lasix . On subsequent follow up he has done well.    On follow up today he is doing very well. No swelling, dyspnea, chest pain or palpitations. Energy level is very good. He walks 3-4 miles a day. Rarely has gout.    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   GERD (gastroesophageal reflux disease)    H/O cardiovascular stress test 2010   Adenosine  Myoview    H/O echocardiogram 2010    mild LVH with some septal dyssynergy, EF 45%, impaired relaxation   History of prostate cancer    Hyperlipidemia    Hypertension    Left bundle branch block    NSVT (nonsustained ventricular tachycardia) (HCC)    Palpitations    Prostate cancer (HCC) 04/02/2014   Gleason 7, volume 30 gm   S/P radiation therapy 06/26/2014 through 08/22/2014                                                      Prostate 7800 cGy in 40 sessions  Torn tendon    right shoulder    Past Surgical History:  Procedure Laterality Date   BACK SURGERY  09/21/1991   CARDIAC CATHETERIZATION  09/20/1988   Normal cors   CARDIAC CATHETERIZATION N/A 06/06/2016   Procedure: Left Heart Cath and Coronary Angiography;  Surgeon: Candyce GORMAN Reek, MD;  Location: [facility] INVASIVE CV LAB;  Service: Cardiovascular;  Laterality: N/A;   PROSTATE BIOPSY  04/02/2014   Gleason 7, vol 30 gm    Current Medications: Allergies as of 05/17/2024       Reactions   Chocolate Flavoring Agent (non-screening) Swelling   Ace Inhibitors Other (See Comments)   Cold sores   Chocolate Other (See Comments)   Blisters   Other Other (See Comments)   Seed on  bread causes lip blisters Anesthesia also, but does nor remember which one   Peanut-containing Drug Products Other (See Comments)   Blisters   Hydralazine  Palpitations   Irregular heartbeat, fluttering        Medication List        Accurate as of May 17, 2024  9:53 AM. If you have any questions, ask your nurse or doctor.          apixaban  5 MG Tabs tablet Commonly known as: Eliquis  Take 1 tablet (5 mg total) by mouth 2 (two) times daily.   colchicine  0.6 MG tablet Take 1 tablet (0.6 mg total) by mouth as needed (gout).   empagliflozin  10 MG Tabs tablet Commonly known as: Jardiance  Take 1 tablet (10 mg total) by mouth daily before breakfast.   esomeprazole  20 MG capsule Commonly known as: NEXIUM  TAKE 1 CAPSULE BY MOUTH TWICE DAILY AT 8AM AND 10PM   furosemide  20 MG tablet Commonly known as: LASIX  TAKE 2 TABLETS BY MOUTH ONCE DAILY .   HYDROcodone -acetaminophen  5-325 MG tablet Commonly known as: NORCO/VICODIN Take 1 tablet by mouth as needed.   metoprolol  succinate 100 MG 24 hr tablet Commonly known as: TOPROL -XL TAKE 1 TABLET BY MOUTH IN THE MORNING What changed:  how much to take how to take this when to take this additional instructions Changed by: Sherylann Vangorden [place]   metoprolol  succinate 50 MG 24 hr tablet Commonly known as: TOPROL -XL TAKE 1 TABLET BY MOUTH ONCE DAILY IN THE EVENING WITH MEALS OR  IMMEDIATELY  FOLLOWING  A  MEAL What changed: See the new instructions. Changed by: Sloane Palmer [place]   omeprazole  20 MG capsule Commonly known as: PRILOSEC Take 20 mg by mouth daily.   rosuvastatin  40 MG tablet Commonly known as: CRESTOR  Take 1 tablet (40 mg total) by mouth daily.   sacubitril -valsartan  97-103 MG Commonly known as: Entresto  Take 1 tablet by mouth 2 (two) times daily.   valACYclovir  1000 MG tablet Commonly known as: VALTREX  SMARTSIG:1 Tablet(s) By Mouth Every 12 Hours        Allergies:   Chocolate flavoring agent (non-screening), Ace  inhibitors, Chocolate, Other, Peanut-containing drug products, and Hydralazine    Social History   Socioeconomic History   Marital status: Married    Spouse name: Not on file   Number of children: 2   Years of education: Not on file   Highest education level: Not on file  Occupational History   Occupation: Copywriter, advertising  Tobacco Use   Smoking status: Former    Current packs/day: 0.00    Types: Cigarettes    Quit date: 02/22/1979    Years since quitting: 45.2    Passive exposure: Never   Smokeless tobacco: Never  Vaping Use   Vaping status: Never Used  Substance and Sexual Activity   Alcohol use: Yes    Alcohol/week: 28.0 standard drinks of alcohol    Types: 28 Cans of beer per week    Comment: beer daily - 4 daily   Drug use: No   Sexual activity: Not Currently  Other Topics Concern   Not on file  Social History Narrative   Not on file   Social Drivers of Health   Financial Resource Strain: Low Risk  (09/22/2023)   Overall Financial Resource Strain (CARDIA)    Difficulty of Paying Living Expenses: Not hard at all  Food Insecurity: No Food Insecurity (09/22/2023)   Hunger Vital Sign    Worried About Running Out of Food in the Last Year: Never true    Ran Out of Food in the Last Year: Never true  Transportation Needs: No Transportation Needs (09/22/2023)   PRAPARE - Administrator, Civil Service (Medical): No    Lack of Transportation (Non-Medical): No  Physical Activity: Sufficiently Active (09/22/2023)   Exercise Vital Sign    Days of Exercise per Week: 4 days    Minutes of Exercise per Session: 60 min  Stress: No Stress Concern Present (09/22/2023)   [employer] of Occupational Health - Occupational Stress Questionnaire    Feeling of Stress : Not at all  Social Connections: Moderately Isolated (09/22/2023)   Social Connection and Isolation Panel    Frequency of Communication with Friends and Family: More than three times a week    Frequency of Social  Gatherings with Friends and Family: Once a week    Attends Religious Services: Never    Database administrator or Organizations: No    Attends Engineer, structural: Never    Marital Status: Married     Family History:  The patient's family history includes Asthma in his mother; Esophageal cancer in his father; Prostate cancer in his brother; Throat cancer (age of onset: 42) in his father.   ROS:   Please see the history of present illness.    ROSd All other systems reviewed and are negative.   PHYSICAL EXAM:   VS:  BP 120/78   Pulse 67   Ht 5' 11 (1.803 m)   Wt 169 lb (76.7 kg)   SpO2 96%   BMI 23.57 kg/m   GENERAL:  Well appearing WM in NAD HEENT:  PERRL, EOMI, sclera are clear. Oropharynx is clear. NECK:  No jugular venous distention, carotid upstroke brisk and symmetric, no bruits, no thyromegaly or adenopathy LUNGS:  Clear to auscultation bilaterally CHEST:  Unremarkable HEART:  IRRR,  PMI not displaced or sustained,S1 and S2 within normal limits, no S3, no S4: no clicks, no rubs, no murmurs ABD:  Soft, nontender. BS +, no masses or bruits. No hepatomegaly, no splenomegaly EXT:  2 + pulses throughout, no edema, no cyanosis no clubbing SKIN:  Warm and dry.  No rashes NEURO:  Alert and oriented x 3. Cranial nerves II through XII intact. PSYCH:  Cognitively intact    Wt Readings from Last 3 Encounters:  05/17/24 169 lb (76.7 kg)  04/18/24 170 lb 14.4 oz (77.5 kg)  06/24/23 165 lb 12.8 oz (75.2 kg)      Studies/Labs Reviewed:   EKG Interpretation Date/Time:  Thursday May 17 2024 09:34:07 EDT Ventricular Rate:  67 PR Interval:    QRS Duration:  160 QT Interval:  474 QTC Calculation: 500 R Axis:  33  Text Interpretation: Atrial fibrillation Left bundle branch block When compared with ECG of 28-Mar-2023 08:57, T wave inversion no longer evident in Inferior leads T wave inversion more evident in Anterolateral leads Confirmed by [place], Yoana Staib [phone]) on  05/17/2024 9:42:48 AM   Recent Labs: No results found for requested labs within last 365 days.   Lipid Panel    Component Value Date/Time   CHOL 144 03/31/2023 0901   TRIG 100 03/31/2023 0901   HDL 81 03/31/2023 0901   CHOLHDL 1.8 03/31/2023 0901   LDLCALC 45 03/31/2023 0901    Additional studies/ records that were reviewed today include:  Labs dated 02/16/17: cholesterol 255, triglycerides 291, HDL 44, LDL 153. Chemistries, CBC, TSH normal.   Cath 06/06/2016 Conclusion     Prox RCA lesion, 30 %stenosed at a bend. The left ventricular ejection fraction is 25-35% by visual estimate. The pattern of apical wall motion abnormality without severe CAD is suggestive of Takotsubo cardiomyopathy. LV end diastolic pressure is moderately elevated. There is no aortic valve stenosis.   Continue with aggressive medical therapy. He is allergic to ace inhibitors. Will add isosorbide  to his hydralazine . Continue beta blocker. 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 He states that he was lying in bed for about 3 hours very worried about what he would do. He then started to have chest discomfort. The history is also consistent with Takatsubo cardiomyopathy.  May need diuresis given moderately elevated LVEDP.      Echo 07/20/2016 LV EF: 40% -   45%   Study Conclusions   - Left ventricle: The cavity size was normal. Systolic function was   mildly to moderately reduced. The estimated ejection fraction was   in the range of 40% to 45%. Diffuse hypokinesis. There was a   reduced contribution of atrial contraction to ventricular   filling, due to increased ventricular diastolic pressure or   atrial contractile dysfunction. Doppler parameters are consistent   with a reversible restrictive pattern, indicative of decreased   left ventricular diastolic compliance and/or increased left   atrial pressure (grade 3 diastolic  dysfunction). - Ventricular septum: Septal motion showed moderate paradox. These   changes are consistent with intraventricular conduction delay. - Left atrium: The atrium was mildly dilated. - Pulmonary arteries: PA peak pressure: 40 mm Hg (S).   Impressions:   - The right ventricular systolic pressure was increased consistent   with mild pulmonary hypertension.   Echo 05/31/18: Study Conclusions   - Left ventricle: Systolic function was severely reduced. The   estimated ejection fraction was in the range of 25% to 30%. - Mitral valve: There was mild regurgitation.   Echo 02/25/22: IMPRESSIONS     1. Left ventricular ejection fraction, by estimation, is 40 to 45%. The  left ventricle has mildly decreased function. The left ventricle  demonstrates global hypokinesis. There is mild left ventricular  hypertrophy. Left ventricular diastolic parameters  are consistent with Grade I diastolic dysfunction (impaired relaxation).   2. Right ventricular systolic function is normal. The right ventricular  size is mildly enlarged.   3. The mitral valve is normal in structure. No evidence of mitral valve  regurgitation. No evidence of mitral stenosis.   4. The aortic valve is tricuspid. There is mild calcification of the  aortic valve. There is mild thickening of the aortic valve. Aortic valve  regurgitation is mild. Aortic valve sclerosis is present, with no evidence  of aortic valve stenosis.   5. The inferior vena cava is normal in size with greater than 50%  respiratory variability, suggesting right atrial pressure of 3 mmHg.   Comparison(s): The left ventricular function has improved. Prior EF  25-30%.   ASSESSMENT:    1. Atrial flutter, unspecified type (HCC)   2. Chronic systolic CHF (congestive heart failure) (HCC)   3. Permanent atrial fibrillation (HCC)   4. Congestive dilated cardiomyopathy (HCC)   5. LBBB (left bundle branch block)   6. Essential hypertension         PLAN:  In order of problems listed above:    Chronic systolic CHF. NYHA functional class 1.  NICM:  Now on good medical therapy with Entresto , Toprol  XL, Jardiance , and lasix .  HR is well controlled.  EF has improved from 25-30% to 40-45%. LV dysfunction is multifactorial related to prior Etoh abuse, dyssynergy with LBBB, prior embolic event, Afib. Intolerant of aldactone  due to hyperkalemia. Continue low sodium diet. Regular aerobic activity  Chronic atrial fib/flutter: on eliquis  5mg  BID. CHA2DS2-Vasc score 2 (HTN, HF).  Rate is well controlled.   Hypertension:  well controlled.   Hyperlipidemia: at goal on statin therapy.  Will update labs today  5.   Gout. Rare symptoms- improved with colchicine  PRN.   6.   LBBB chronic  Follow up in one year  Medication Adjustments/Labs and Tests Ordered: Current medicines are reviewed at length with the patient today.  Concerns regarding medicines are outlined above.  Medication changes, Labs and Tests ordered today are listed in the Patient Instructions below. There are no Patient Instructions on file for this visit.     Signed, Ahri Olson [place], MD  05/17/2024 9:53 AM    [facility] [address] [place], [place], [place]  [address] Phone: [phone]; Fax: [phone]

## 2024-05-17 ENCOUNTER — Ambulatory Visit: Attending: Cardiology | Admitting: Cardiology

## 2024-05-17 ENCOUNTER — Encounter: Payer: Self-pay | Admitting: Cardiology

## 2024-05-17 VITALS — BP 120/78 | HR 67 | Ht 71.0 in | Wt 169.0 lb

## 2024-05-17 DIAGNOSIS — I420 Dilated cardiomyopathy: Secondary | ICD-10-CM

## 2024-05-17 DIAGNOSIS — I4821 Permanent atrial fibrillation: Secondary | ICD-10-CM | POA: Diagnosis not present

## 2024-05-17 DIAGNOSIS — I5022 Chronic systolic (congestive) heart failure: Secondary | ICD-10-CM

## 2024-05-17 DIAGNOSIS — I447 Left bundle-branch block, unspecified: Secondary | ICD-10-CM

## 2024-05-17 DIAGNOSIS — I4892 Unspecified atrial flutter: Secondary | ICD-10-CM | POA: Diagnosis not present

## 2024-05-17 DIAGNOSIS — I10 Essential (primary) hypertension: Secondary | ICD-10-CM | POA: Diagnosis not present

## 2024-05-17 LAB — LIPID PANEL

## 2024-05-17 MED ORDER — FUROSEMIDE 20 MG PO TABS: ORAL_TABLET | ORAL | 3 refills | Status: AC

## 2024-05-17 MED ORDER — METOPROLOL SUCCINATE ER 100 MG PO TB24: ORAL_TABLET | ORAL | 3 refills | Status: AC

## 2024-05-17 MED ORDER — COLCHICINE 0.6 MG PO TABS: 0.6000 mg | ORAL_TABLET | ORAL | 3 refills | Status: AC | PRN

## 2024-05-17 MED ORDER — APIXABAN 5 MG PO TABS: 5.0000 mg | ORAL_TABLET | Freq: Two times a day (BID) | ORAL | 3 refills | Status: AC

## 2024-05-17 MED ORDER — METOPROLOL SUCCINATE ER 50 MG PO TB24: ORAL_TABLET | ORAL | 3 refills | Status: AC

## 2024-05-17 MED ORDER — SACUBITRIL-VALSARTAN 97-103 MG PO TABS: 1.0000 | ORAL_TABLET | Freq: Two times a day (BID) | ORAL | 3 refills | Status: AC

## 2024-05-17 MED ORDER — ROSUVASTATIN CALCIUM 40 MG PO TABS: 40.0000 mg | ORAL_TABLET | Freq: Every day | ORAL | 3 refills | Status: AC

## 2024-05-17 MED ORDER — EMPAGLIFLOZIN 10 MG PO TABS: 10.0000 mg | ORAL_TABLET | Freq: Every day | ORAL | 3 refills | Status: AC

## 2024-05-17 NOTE — Patient Instructions (Signed)
 Medication Instructions:  Continue same medications *If you need a refill on your cardiac medications before your next appointment, please call your pharmacy*  Lab Work: Cbc,cmet,lipid panel,magnesium  today  Testing/Procedures: None ordered  Follow-Up: At [facility], you and your health needs are our priority.  As part of our continuing mission to provide you with exceptional heart care, our providers are all part of one team.  This team includes your primary Cardiologist (physician) and Advanced Practice Providers or APPs (Physician Assistants and Nurse Practitioners) who all work together to provide you with the care you need, when you need it.  Your next appointment:  1 year   Call in April to schedule August appointment     Provider:  Dr.Jordan    We recommend signing up for the patient portal called MyChart.  Sign up information is provided on this After Visit Summary.  MyChart is used to connect with patients for Virtual Visits (Telemedicine).  Patients are able to view lab/test results, encounter notes, upcoming appointments, etc.  Non-urgent messages can be sent to your provider as well.   To learn more about what you can do with MyChart, go to [email or web address].

## 2024-05-18 ENCOUNTER — Ambulatory Visit: Payer: Self-pay | Admitting: Cardiology

## 2024-05-18 ENCOUNTER — Telehealth: Payer: Self-pay

## 2024-05-18 LAB — COMPREHENSIVE METABOLIC PANEL WITH GFR
ALT: 33 IU/L (ref 0–44)
AST: 36 IU/L (ref 0–40)
Albumin: 4.5 g/dL (ref 3.9–4.9)
Alkaline Phosphatase: 83 IU/L (ref 44–121)
BUN/Creatinine Ratio: 20 (ref 10–24)
BUN: 19 mg/dL (ref 8–27)
Bilirubin Total: 0.4 mg/dL (ref 0.0–1.2)
CO2: 24 mmol/L (ref 20–29)
Calcium: 9.6 mg/dL (ref 8.6–10.2)
Chloride: 100 mmol/L (ref 96–106)
Creatinine, Ser: 0.95 mg/dL (ref 0.76–1.27)
Globulin, Total: 2.4 g/dL (ref 1.5–4.5)
Glucose: 75 mg/dL (ref 70–99)
Potassium: 5 mmol/L (ref 3.5–5.2)
Sodium: 141 mmol/L (ref 134–144)
Total Protein: 6.9 g/dL (ref 6.0–8.5)
eGFR: 87 mL/min/1.73 (ref >59)

## 2024-05-18 LAB — CBC WITH DIFFERENTIAL/PLATELET
Basophils Absolute: 0 x10E3/uL (ref 0.0–0.2)
Basos: 1 %
EOS (ABSOLUTE): 0.1 x10E3/uL (ref 0.0–0.4)
Eos: 3 %
Hematocrit: 45.1 % (ref 37.5–51.0)
Hemoglobin: 14.8 g/dL (ref 13.0–17.7)
Immature Grans (Abs): 0 x10E3/uL (ref 0.0–0.1)
Immature Granulocytes: 0 %
Lymphocytes Absolute: 1.1 x10E3/uL (ref 0.7–3.1)
Lymphs: 26 %
MCH: 33.5 pg — ABNORMAL HIGH (ref 26.6–33.0)
MCHC: 32.8 g/dL (ref 31.5–35.7)
MCV: 102 fL — ABNORMAL HIGH (ref 79–97)
Monocytes Absolute: 0.5 x10E3/uL (ref 0.1–0.9)
Monocytes: 12 %
Neutrophils Absolute: 2.5 x10E3/uL (ref 1.4–7.0)
Neutrophils: 58 %
Platelets: 163 x10E3/uL (ref 150–450)
RBC: 4.42 x10E6/uL (ref 4.14–5.80)
RDW: 13.5 % (ref 11.6–15.4)
WBC: 4.2 x10E3/uL (ref 3.4–10.8)

## 2024-05-18 LAB — MAGNESIUM: Magnesium: 2.4 mg/dL — AB (ref 1.6–2.3)

## 2024-05-18 LAB — LIPID PANEL
Cholesterol, Total: 141 mg/dL (ref 100–199)
HDL: 100 mg/dL (ref >39)
LDL CALC COMMENT:: 1.4 ratio (ref 0.0–5.0)
LDL Chol Calc (NIH): 28 mg/dL (ref 0–99)
Triglycerides: 61 mg/dL (ref 0–149)
VLDL Cholesterol Cal: 13 mg/dL (ref 5–40)

## 2024-05-18 NOTE — Telephone Encounter (Signed)
 Dr. [place],  You saw this patient on 05/17/2024. Per protocol we request that you comment on his cardiac risk to proceed with RIGHT ELBOW BURSECTOMY WITH ULNAR NERVE RELEASE AS NECESSARY AND SPUR REMOVAL AND TRICEPS REPAIR AS NECESSARY scheduled for 08/21/2024 since it has been less than 2 months since evaluated in the office. Please send your comment to P CV Pre-Op Pool.  Thank you, Lamarr Satterfield DNP, ANP, AACC.

## 2024-05-18 NOTE — Telephone Encounter (Signed)
   Pre-operative Risk Assessment    Patient Name: Dennis Lewis  DOB: 1956/01/16 MRN: [id number]   Date of last office visit: 05/17/24 PETER [place], MD Date of next office visit: NONE   Request for Surgical Clearance    Procedure:  RIGHT ELBOW BURSECTOMY WITH ULNAR NERVE RELEASE AS NECESSARY AND SPUR REMOVAL AND TRICEPS REPAIR AS NECESSARY  Date of Surgery:  Clearance 08/21/24                                Surgeon:  DR ELSIE MUSSEL Surgeon's Group or Practice Name:  [facility] Phone number:  [phone] Fax number:  [phone]  ATTN: JOEN SIC   Type of Clearance Requested:   - Medical  - Pharmacy:  Hold Apixaban  (Eliquis )     Type of Anesthesia:  Not Indicated   Additional requests/questions:    SignedLucie DELENA Ku   05/18/2024, 3:14 PM

## 2024-06-01 NOTE — Telephone Encounter (Signed)
   Patient Name: Dennis Lewis  DOB: 1956-08-01 MRN: [id number]  Primary Cardiologist: Peter [place], MD  Chart reviewed as part of pre-operative protocol coverage. Pre-op clearance already addressed by colleagues in earlier phone notes. To summarize recommendations:  -Yes he is cleared for surgery. May hold Eliquis  for 48 hours prior.   Peter [place] MD, [facility]  Will route this bundled recommendation to requesting provider via Epic fax function and remove from pre-op pool. Please call with questions.  Orren LOISE Fabry, PA-C 06/01/2024, 9:17 AM

## 2024-06-03 ENCOUNTER — Other Ambulatory Visit: Payer: Self-pay

## 2024-06-03 DIAGNOSIS — K219 Gastro-esophageal reflux disease without esophagitis: Secondary | ICD-10-CM

## 2024-06-04 ENCOUNTER — Ambulatory Visit

## 2024-06-07 ENCOUNTER — Ambulatory Visit

## 2024-06-22 DIAGNOSIS — M7542 Impingement syndrome of left shoulder: Secondary | ICD-10-CM | POA: Diagnosis not present

## 2024-06-22 DIAGNOSIS — M25512 Pain in left shoulder: Secondary | ICD-10-CM | POA: Diagnosis not present

## 2024-07-01 ENCOUNTER — Other Ambulatory Visit: Payer: Self-pay

## 2024-07-01 DIAGNOSIS — K219 Gastro-esophageal reflux disease without esophagitis: Secondary | ICD-10-CM

## 2024-07-04 ENCOUNTER — Ambulatory Visit: Admitting: Allergy

## 2024-07-16 NOTE — Patient Instructions (Incomplete)
 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  - Skin testing today is - Copy of skin testing.  Allergic rhinitis with conjunctival symptoms Symptoms suggest environmental allergen sensitivity. - Skin testing today is -Copy of skin test given - Start avoidance measures as below.  Impacted cerumen, left ear Increased earwax causing discomfort in left ear. - Recommend over-the-counter earwax removal kit, such as Debrox.  Schedule follow up in months or sooner if needed

## 2024-07-17 ENCOUNTER — Encounter: Payer: Self-pay | Admitting: Family

## 2024-07-17 ENCOUNTER — Ambulatory Visit: Admitting: Family

## 2024-07-17 DIAGNOSIS — T7819XD Other adverse food reactions, not elsewhere classified, subsequent encounter: Secondary | ICD-10-CM | POA: Diagnosis not present

## 2024-07-17 DIAGNOSIS — J310 Chronic rhinitis: Secondary | ICD-10-CM

## 2024-07-17 DIAGNOSIS — R21 Rash and other nonspecific skin eruption: Secondary | ICD-10-CM

## 2024-07-17 NOTE — Progress Notes (Signed)
 Date of Service/Encounter:  07/17/24  Allergy testing appointment   Initial visit on 04/18/24, seen for adverse food reaction, rhinoconjunctivitis, and impacted cerumen of left ear.  Please see that note for additional details.  Today reports for allergy diagnostic testing:    DIAGNOSTICS:  Skin Testing: Environmental allergy panel and select foods. Adequate positive and negative controls Results discussed with patient/family.   Airborne Adult Perc - 07/17/24 1300     Time Antigen Placed 1357    Allergen Manufacturer Jestine    Location Back    Number of Test 55    Panel 1 Select    1. Control-Buffer 50% Glycerol Negative    2. Control-Histamine 3+    3. Bahia Negative    4. Bermuda Negative    5. Johnson Negative    6. [place]  Blue Negative    7. Meadow Fescue Negative    8. Perennial Rye Negative    9. Timothy Negative    10. Ragweed Mix Negative    11. Cocklebur Negative    12. Plantain,  English Negative    13. Baccharis Negative    14. Dog Fennel Negative    15. Russian Thistle Negative    16. Lamb's Quarters Negative    17. Sheep Sorrell Negative    18. Rough Pigweed Negative    19. Marsh Elder, Rough Negative    20. Mugwort, Common Negative    21. Box, Elder Negative    22. Cedar, red Negative    23. Sweet Gum Negative    24. Pecan Pollen Negative    25. Pine Mix Negative    26. Walnut, Black Pollen Negative    27. Red Mulberry Negative    28. Ash Mix Negative    29. Birch Mix Negative    30. Beech American Negative    31. Cottonwood, Eastern Negative    32. Hickory, White Negative    33. Maple Mix Negative    34. Oak, Eastern Mix Negative    35. Sycamore Eastern Negative    36. Alternaria Alternata Negative    37. Cladosporium Herbarum Negative    38. Aspergillus Mix Negative    39. Penicillium Mix Negative    40. Bipolaris Sorokiniana (Helminthosporium) Negative    41. Drechslera Spicifera (Curvularia) Negative    42. Mucor Plumbeus Negative     43. Fusarium Moniliforme Negative    44. Aureobasidium Pullulans (pullulara) Negative    45. Rhizopus Oryzae Negative    46. Botrytis Cinera Negative    47. Epicoccum Nigrum Negative    48. Phoma Betae Negative    49. Dust Mite Mix Negative    50. Cat Hair 10,000 BAU/ml Negative    51.  Dog Epithelia Negative    52. Mixed Feathers Negative    53. Horse Epithelia Negative    54. Cockroach, German Negative    55. Tobacco Leaf Negative          Food Adult Perc - 07/17/24 1300     Time Antigen Placed 1358    Allergen Manufacturer Jestine    Location Back    Number of allergen test 6    1. Peanut Negative    28. Oat  Negative    55. Orange  Negative    56. Lemon Negative    65. Pineapple Negative    66. Chocolate/Cacao Bean Negative          Allergy testing results were read and interpreted by myself, documented by clinical staff.  Patient  provided with copy of allergy testing along with avoidance measures when indicated.   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  - Skin testing today is negative to select foods (peanut, chocolate, oats, orange, lemon, and pineapple) with adequate controls.  Discussed that he does not need to have an EpiPen due to the symptoms. -Recommend avoiding the foods that cause perioral blisters - Copy of skin testing.  Chronic rhinitis with conjunctival symptoms Symptoms suggest environmental allergen sensitivity. - Skin testing today is negative to environmental allergens with adequate controls.  Discussed intradermal's to environmental allergens as an option, but he does not feel like his allergies are that big of a problem.  We can consider doing intradermal's in the future if needed -Copy of skin test given  Follow-up as needed  [name], FNP [facility] 

## 2024-07-26 DIAGNOSIS — M546 Pain in thoracic spine: Secondary | ICD-10-CM | POA: Diagnosis not present

## 2024-07-26 DIAGNOSIS — M542 Cervicalgia: Secondary | ICD-10-CM | POA: Diagnosis not present

## 2024-07-26 DIAGNOSIS — M25551 Pain in right hip: Secondary | ICD-10-CM | POA: Diagnosis not present

## 2024-07-30 ENCOUNTER — Ambulatory Visit

## 2024-07-30 VITALS — BP 114/75 | HR 67 | Temp 97.9°F | Ht 71.0 in | Wt 170.1 lb

## 2024-07-30 DIAGNOSIS — H6122 Impacted cerumen, left ear: Secondary | ICD-10-CM | POA: Diagnosis not present

## 2024-07-30 DIAGNOSIS — J3089 Other allergic rhinitis: Secondary | ICD-10-CM | POA: Diagnosis not present

## 2024-07-30 DIAGNOSIS — J309 Allergic rhinitis, unspecified: Secondary | ICD-10-CM | POA: Insufficient documentation

## 2024-07-30 MED ORDER — FEXOFENADINE HCL 180 MG PO TABS: 180.0000 mg | ORAL_TABLET | Freq: Every day | ORAL | 1 refills | Status: DC

## 2024-07-30 MED ORDER — FLUTICASONE PROPIONATE 50 MCG/ACT NA SUSP: 2.0000 | Freq: Every day | NASAL | 6 refills | Status: AC
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
## 2024-07-30 NOTE — Progress Notes (Signed)
   Acute Office Visit  Subjective:     Patient ID: Dennis Lewis, male    DOB: 10-25-1955, 68 y.o.   MRN: [id number]  Chief Complaint  Patient presents with   Otalgia    Onset:     HPI  History of Present Illness   Dennis Lewis is a 68 year old male who presents with ear ringing and fullness.  Otologic symptoms - Ear ringing began yesterday, particularly loud last night, decreased in intensity today - Ear fullness present - No prior history of ear ringing - No dizziness - No vision changes  Allergic symptoms - History of allergies - Current symptoms include runny nose and itchy eyes, post nasal drainage. - No prior use of Zyrtec or Allegra  and does not have any at home - Postnasal drainage present - Coughing upon waking      ROS Per HPI     Objective:    Ht 5' 11 (1.803 m)   BMI 23.57 kg/m    Physical Exam Constitutional:      General: He is not in acute distress.    Appearance: Normal appearance.  HENT:     Right Ear: Tympanic membrane normal.     Left Ear: There is impacted cerumen.  Cardiovascular:     Rate and Rhythm: Normal rate and regular rhythm.     Heart sounds: Normal heart sounds. No murmur heard.    No friction rub. No gallop.  Pulmonary:     Effort: Pulmonary effort is normal. No respiratory distress.     Breath sounds: Normal breath sounds.  Musculoskeletal:        General: No swelling.  Skin:    General: Skin is warm and dry.  Neurological:     General: No focal deficit present.     Mental Status: He is alert.  Psychiatric:        Mood and Affect: Mood normal.        Behavior: Behavior normal.        Thought Content: Thought content normal.      No results found for any visits on 07/30/24.      Assessment & Plan:  Impacted cerumen of left ear Assessment & Plan: Impacted cerumen in left ear likely contributing to tinnitus.  - Flushed left ear to remove impacted cerumen, unsuccessful today. Recommended Debrox drops to soften  the wax to make removal easier. Patient will call the office to schedule another flushing.      Allergic rhinitis due to other allergic trigger, unspecified seasonality Assessment & Plan: Rhinorrhea and itchy eyes may contribute to middle ear effusion and tinnitus. - Prescribed Zyrtec or Allegra . - Prescribed Flonase  nasal spray.    Other orders -     Fexofenadine  HCl; Take 1 tablet (180 mg total) by mouth daily.  Dispense: 30 tablet; Refill: 1 -     Fluticasone  Propionate; Place 2 sprays into both nostrils daily.  Dispense: 16 g; Refill: 6     Return if symptoms worsen or fail to improve.  Saddie JULIANNA Sacks, PA-C

## 2024-07-30 NOTE — Assessment & Plan Note (Signed)
 Rhinorrhea and itchy eyes may contribute to middle ear effusion and tinnitus. - Prescribed Zyrtec or Allegra . - Prescribed Flonase  nasal spray.

## 2024-07-30 NOTE — Assessment & Plan Note (Signed)
 Impacted cerumen in left ear likely contributing to tinnitus.  - Flushed left ear to remove impacted cerumen, unsuccessful today. Recommended Debrox drops to soften the wax to make removal easier. Patient will call the office to schedule another flushing.

## 2024-08-03 ENCOUNTER — Other Ambulatory Visit: Payer: Self-pay

## 2024-08-03 DIAGNOSIS — K219 Gastro-esophageal reflux disease without esophagitis: Secondary | ICD-10-CM

## 2024-08-10 DIAGNOSIS — M25551 Pain in right hip: Secondary | ICD-10-CM | POA: Diagnosis not present

## 2024-08-10 DIAGNOSIS — M542 Cervicalgia: Secondary | ICD-10-CM | POA: Diagnosis not present

## 2024-08-10 DIAGNOSIS — M546 Pain in thoracic spine: Secondary | ICD-10-CM | POA: Diagnosis not present

## 2024-08-21 DIAGNOSIS — M7021 Olecranon bursitis, right elbow: Secondary | ICD-10-CM | POA: Diagnosis not present

## 2024-08-21 DIAGNOSIS — S46311A Strain of muscle, fascia and tendon of triceps, right arm, initial encounter: Secondary | ICD-10-CM | POA: Diagnosis not present

## 2024-08-21 DIAGNOSIS — M25721 Osteophyte, right elbow: Secondary | ICD-10-CM | POA: Diagnosis not present

## 2024-08-21 DIAGNOSIS — M778 Other enthesopathies, not elsewhere classified: Secondary | ICD-10-CM | POA: Diagnosis not present

## 2024-08-21 DIAGNOSIS — M25821 Other specified joint disorders, right elbow: Secondary | ICD-10-CM | POA: Diagnosis not present

## 2024-08-25 ENCOUNTER — Other Ambulatory Visit: Payer: Self-pay | Admitting: Cardiology

## 2024-08-25 ENCOUNTER — Other Ambulatory Visit: Payer: Self-pay

## 2024-08-25 DIAGNOSIS — K219 Gastro-esophageal reflux disease without esophagitis: Secondary | ICD-10-CM

## 2024-09-25 ENCOUNTER — Ambulatory Visit (INDEPENDENT_AMBULATORY_CARE_PROVIDER_SITE_OTHER)

## 2024-09-25 VITALS — BP 122/77 | HR 73 | Temp 98.7°F | Ht 71.0 in | Wt 167.1 lb

## 2024-09-25 DIAGNOSIS — K219 Gastro-esophageal reflux disease without esophagitis: Secondary | ICD-10-CM | POA: Diagnosis not present

## 2024-09-25 DIAGNOSIS — E785 Hyperlipidemia, unspecified: Secondary | ICD-10-CM

## 2024-09-25 DIAGNOSIS — J3489 Other specified disorders of nose and nasal sinuses: Secondary | ICD-10-CM | POA: Insufficient documentation

## 2024-09-25 DIAGNOSIS — I10 Essential (primary) hypertension: Secondary | ICD-10-CM | POA: Diagnosis not present

## 2024-09-25 DIAGNOSIS — Z1211 Encounter for screening for malignant neoplasm of colon: Secondary | ICD-10-CM

## 2024-09-25 DIAGNOSIS — I5023 Acute on chronic systolic (congestive) heart failure: Secondary | ICD-10-CM

## 2024-09-25 DIAGNOSIS — I4892 Unspecified atrial flutter: Secondary | ICD-10-CM | POA: Diagnosis not present

## 2024-09-25 DIAGNOSIS — Z0000 Encounter for general adult medical examination without abnormal findings: Secondary | ICD-10-CM

## 2024-09-25 DIAGNOSIS — I420 Dilated cardiomyopathy: Secondary | ICD-10-CM

## 2024-09-25 DIAGNOSIS — I4821 Permanent atrial fibrillation: Secondary | ICD-10-CM | POA: Diagnosis not present

## 2024-09-25 DIAGNOSIS — Z131 Encounter for screening for diabetes mellitus: Secondary | ICD-10-CM

## 2024-09-25 LAB — POCT GLYCOSYLATED HEMOGLOBIN (HGB A1C): HbA1c POC (<> result, manual entry): 5.3 %

## 2024-09-25 MED ORDER — FEXOFENADINE HCL 180 MG PO TABS: 180.0000 mg | ORAL_TABLET | Freq: Every day | ORAL | 2 refills | Status: AC

## 2024-09-25 MED ORDER — MUPIROCIN 2 % EX OINT: 1.0000 | TOPICAL_OINTMENT | Freq: Two times a day (BID) | CUTANEOUS | 0 refills | Status: AC

## 2024-09-25 MED ORDER — ESOMEPRAZOLE MAGNESIUM 20 MG PO CPDR: 20.0000 mg | DELAYED_RELEASE_CAPSULE | Freq: Two times a day (BID) | ORAL | 2 refills | Status: AC

## 2024-09-25 NOTE — Assessment & Plan Note (Addendum)
 Rate controlled on Metoprolol  XR 100 mg AM and 50 mg XR in PM. Anticoagulated on Eliquis . Followed by cardiology.

## 2024-09-25 NOTE — Assessment & Plan Note (Signed)
 Lipids are within normal range. Also followed by cardiology. Continue rosuvastatin  40 mg.

## 2024-09-25 NOTE — Progress Notes (Signed)
 "  Established Patient Office Visit  Subjective   Patient ID: Dennis Lewis, male    DOB: Jan 15, 1956  Age: 69 y.o. MRN: [id number]  Chief Complaint  Patient presents with   Medication Management    HPI  Discussed the use of AI scribe software for clinical note transcription with the patient, who gave verbal consent to proceed.  History of Present Illness   Dennis Lewis is a 69 year old male who presents for medication management and follow-up.  Gastroesophageal reflux disease (gerd) - Significant improvement in symptoms following dietary changes and weight loss - Nexium  taken twice daily with effective symptom control  Allergic rhinitis and nasal congestion - Allegra  provides benefit, particularly in alleviating nasal congestion - Previously required nasal strips for sleep, now discontinued due to improvement - Flonase  used with some improvement in symptoms - Patient also notes a sore spot on the inside of his left nostril that is painful to touch and sting when he uses nasal sprays. Denies blood or discharge.   Tinnitus and eustachian tube dysfunction - Occasional tinnitus - History of fluid behind eardrums - Partial improvement with Allegra  and Flonase , but symptoms persist - Follow-up with ENT specialist scheduled  Cardiac conduction abnormality and cardiovascular health - History of left bundle branch block - Extensive cardiac evaluation completed; no device required - Medications include Jardiance , Eliquis , and Metoprolol  (100 mg in the morning, 50 mg at night) for CHF.  - Cardiac medications managed by cardiologist which he now follows with yearly - Engages in regular walking exercises to strengthen heart  Urologic health - Hx of prostate cancer treated with radiation - Recent urologist evaluation with normal PSA level - No current urologic symptoms  Preventive health and immunizations - Colonoscopy scheduled later in the year (Nov 2026) - Completed shingles  vaccination series - Plans to update flu, pneumonia, and tetanus vaccines at future dates.     ROS Per HPI.    Objective:     BP 122/77   Pulse 73   Temp 98.7 F (37.1 C) (Oral)   Ht 5' 11 (1.803 m)   Wt 167 lb 1.3 oz (75.8 kg)   SpO2 99%   BMI 23.30 kg/m    Physical Exam Constitutional:      General: He is not in acute distress.    Appearance: Normal appearance.  Cardiovascular:     Rate and Rhythm: Normal rate and regular rhythm.     Heart sounds: Normal heart sounds. No murmur heard.    No friction rub. No gallop.  Pulmonary:     Effort: Pulmonary effort is normal. No respiratory distress.     Breath sounds: Normal breath sounds.  Musculoskeletal:        General: No swelling.  Skin:    General: Skin is warm and dry.  Neurological:     General: No focal deficit present.     Mental Status: He is alert.  Psychiatric:        Mood and Affect: Mood normal.        Behavior: Behavior normal.        Thought Content: Thought content normal.      Results for orders placed or performed in visit on 09/25/24  POCT HgB A1C  Result Value Ref Range   Hemoglobin A1C     HbA1c POC (<> result, manual entry) 5.3 4.0 - 5.6 %   HbA1c, POC (prediabetic range)     HbA1c, POC (controlled diabetic range)  Last CBC Lab Results  Component Value Date   WBC 4.2 05/17/2024   HGB 14.8 05/17/2024   HCT 45.1 05/17/2024   MCV 102 (H) 05/17/2024   MCH 33.5 (H) 05/17/2024   RDW 13.5 05/17/2024   PLT 163 05/17/2024   Last metabolic panel Lab Results  Component Value Date   GLUCOSE 75 05/17/2024   NA 141 05/17/2024   K 5.0 05/17/2024   CL 100 05/17/2024   CO2 24 05/17/2024   BUN 19 05/17/2024   CREATININE 0.95 05/17/2024   EGFR 87 05/17/2024   CALCIUM  9.6 05/17/2024   PROT 6.9 05/17/2024   ALBUMIN 4.5 05/17/2024   LABGLOB 2.4 05/17/2024   AGRATIO 1.5 02/25/2022   BILITOT 0.4 05/17/2024   ALKPHOS 83 05/17/2024   AST 36 05/17/2024   ALT 33 05/17/2024   ANIONGAP  10 06/01/2022   Last lipids Lab Results  Component Value Date   CHOL 141 05/17/2024   HDL 100 05/17/2024   LDLCALC 28 05/17/2024   TRIG 61 05/17/2024   CHOLHDL 1.4 05/17/2024   Last hemoglobin A1c Lab Results  Component Value Date   HGBA1C 5.3 09/25/2024   Last thyroid functions Lab Results  Component Value Date   TSH 1.380 09/19/2019   FREET4 1.58 09/19/2019      The ASCVD Risk score (Arnett DK, et al., 2019) failed to calculate for the following reasons:   Risk score cannot be calculated because patient has a medical history suggesting prior/existing ASCVD   * - Cholesterol units were assumed    Assessment & Plan:   Screen for colon cancer -     Ambulatory referral to Gastroenterology  Gastroesophageal reflux disease, unspecified whether esophagitis present Assessment & Plan: Well controlled with dietary changes. Continue Nexium  BID. Refill sent  Orders: -     Esomeprazole  Magnesium ; Take 1 capsule (20 mg total) by mouth 2 (two) times daily before a meal.  Dispense: 180 capsule; Refill: 2  Screening for diabetes mellitus -     POCT glycosylated hemoglobin (Hb A1C)  Atrial flutter, unspecified type (HCC) Assessment & Plan: Rate controlled on Metoprolol  XR 100 mg AM and 50 mg XR in PM. Anticoagulated on Eliquis . Followed by cardiology.   Permanent atrial fibrillation (HCC) Assessment & Plan: Rate controlled on Metoprolol  XR 100 mg AM and 50 mg XR in PM. Anticoagulated on Eliquis . Followed by cardiology.    Hyperlipidemia, unspecified hyperlipidemia type Assessment & Plan: Lipids are within normal range. Also followed by cardiology. Continue rosuvastatin  40 mg.    Essential hypertension Assessment & Plan: BP stable and at goal. Continue Metoprolol  XL 150 mg daily, Entresto , jardiance  and lasix .    Congestive dilated cardiomyopathy [facility]) Assessment & Plan: Doing well on Entresto . Followed by cardiology   Acute on chronic systolic CHF (congestive  heart failure) (HCC) Assessment & Plan: Stable and asymptomatic. Continue Metoprolol  XL 150 mg daily, Entresto , jardiance  and lasix . Continue follow up with cardiology.    Nasal sore Assessment & Plan: Tiny abrasion noted to right nare. Apply mupirocin  with q-tip twice daily to area for 14 days. Follow up with ENT already scheduled if does not improve.   Other orders -     Fexofenadine  HCl; Take 1 tablet (180 mg total) by mouth daily.  Dispense: 90 tablet; Refill: 2 -     Mupirocin ; Apply 1 Application topically 2 (two) times daily.  Dispense: 22 g; Refill: 0     Return in about 1 year (around 09/25/2025) for Physical.  Saddie JULIANNA Sacks, PA-C "

## 2024-09-25 NOTE — Assessment & Plan Note (Signed)
 Rate controlled on Metoprolol  XR 100 mg AM and 50 mg XR in PM. Anticoagulated on Eliquis . Followed by cardiology.

## 2024-09-25 NOTE — Assessment & Plan Note (Signed)
 Doing well on Entresto . Followed by cardiology

## 2024-09-25 NOTE — Patient Instructions (Signed)
 VISIT SUMMARY: You had a follow-up visit to manage your medications and overall health. Your GERD and allergic rhinitis are well-controlled with your current medications. You have a scheduled follow-up with an ENT specialist for your tinnitus and eustachian tube dysfunction. Your cardiac and urologic health are stable, and you are up-to-date with most of your preventive health measures.  YOUR PLAN: GASTROESOPHAGEAL REFLUX DISEASE (GERD): Your GERD symptoms have significantly improved with dietary changes, weight loss, and Nexium . -Continue taking Nexium  twice daily. A 90-day supply has been refilled.  ALLERGIC RHINITIS AND NASAL CONGESTION: Your allergic rhinitis and nasal congestion are well-controlled with Allegra  and Flonase . -Continue taking Allegra  as needed. A 90-day supply has been refilled. -Continue using Flonase  as needed.  TINNITUS AND EUSTACHIAN TUBE DYSFUNCTION: You have occasional tinnitus and a history of fluid behind your eardrums. Partial improvement with Allegra  and Flonase , but symptoms persist. -Continue using Allegra  and Flonase  as needed. -Follow up with your ENT specialist as scheduled for further evaluation.  CARDIAC CONDUCTION ABNORMALITY AND CARDIOVASCULAR HEALTH: You have a history of left bundle branch block and are on medications managed by your cardiologist. Your cardiac health is stable. -Continue taking Jardiance , Eliquis , and Metoprolol  as prescribed by your cardiologist. -Engage in regular walking exercises to strengthen your heart.  UROLOGIC HEALTH: Your recent urologist evaluation showed a normal PSA level and no current urologic symptoms. -No specific action needed at this time.  PREVENTIVE HEALTH AND IMMUNIZATIONS: You are up-to-date with most of your preventive health measures. A colonoscopy is scheduled for later this year, and you have completed your shingles vaccination. -Colonoscopy is scheduled for November. -Update flu, pneumonia, and tetanus  vaccines as planned. -Performed finger stick A1c test during this visit.  ORAL MUCOSAL ABRASION: You may have an abrasion causing discomfort with Flonase  use, but no visible signs were found on examination. -Apply antibiotic ointment to the affected area. -Follow up with your ENT specialist for further evaluation.  If you have any problems before your next visit feel free to message me via MyChart (minor issues or questions) or call the office, otherwise you may reach out to schedule an office visit.  Thank you! Saddie Sacks, PA-C

## 2024-09-25 NOTE — Assessment & Plan Note (Signed)
 Tiny abrasion noted to right nare. Apply mupirocin  with q-tip twice daily to area for 14 days. Follow up with ENT already scheduled if does not improve.

## 2024-09-25 NOTE — Assessment & Plan Note (Signed)
 Well controlled with dietary changes. Continue Nexium  BID. Refill sent

## 2024-09-25 NOTE — Assessment & Plan Note (Signed)
 BP stable and at goal. Continue Metoprolol  XL 150 mg daily, Entresto , jardiance  and lasix .

## 2024-09-25 NOTE — Assessment & Plan Note (Signed)
 Stable and asymptomatic. Continue Metoprolol  XL 150 mg daily, Entresto , jardiance  and lasix . Continue follow up with cardiology.

## 2024-10-19 ENCOUNTER — Other Ambulatory Visit: Payer: Self-pay

## 2025-09-26 ENCOUNTER — Encounter
